# Patient Record
Sex: Female | Born: 1935 | Race: Black or African American | Hispanic: No | Smoking: Never smoker
Health system: Southern US, Community
[De-identification: ages and names within clinical notes are randomized; demographics above are authoritative.]

## PROBLEM LIST (undated history)

## (undated) DIAGNOSIS — I428 Other cardiomyopathies: Secondary | ICD-10-CM

## (undated) DIAGNOSIS — I10 Essential (primary) hypertension: Secondary | ICD-10-CM

## (undated) DIAGNOSIS — E039 Hypothyroidism, unspecified: Secondary | ICD-10-CM

## (undated) DIAGNOSIS — I739 Peripheral vascular disease, unspecified: Secondary | ICD-10-CM

## (undated) DIAGNOSIS — I214 Non-ST elevation (NSTEMI) myocardial infarction: Secondary | ICD-10-CM

## (undated) DIAGNOSIS — C73 Malignant neoplasm of thyroid gland: Secondary | ICD-10-CM

## (undated) DIAGNOSIS — E669 Obesity, unspecified: Secondary | ICD-10-CM

## (undated) DIAGNOSIS — I2510 Atherosclerotic heart disease of native coronary artery without angina pectoris: Secondary | ICD-10-CM

## (undated) DIAGNOSIS — N183 Chronic kidney disease, stage 3 unspecified: Secondary | ICD-10-CM

## (undated) DIAGNOSIS — C8190 Hodgkin lymphoma, unspecified, unspecified site: Secondary | ICD-10-CM

## (undated) DIAGNOSIS — C50911 Malignant neoplasm of unspecified site of right female breast: Secondary | ICD-10-CM

## (undated) DIAGNOSIS — Z853 Personal history of malignant neoplasm of breast: Secondary | ICD-10-CM

## (undated) DIAGNOSIS — Z85850 Personal history of malignant neoplasm of thyroid: Secondary | ICD-10-CM

## (undated) DIAGNOSIS — I82409 Acute embolism and thrombosis of unspecified deep veins of unspecified lower extremity: Secondary | ICD-10-CM

## (undated) DIAGNOSIS — J45909 Unspecified asthma, uncomplicated: Secondary | ICD-10-CM

## (undated) HISTORY — PX: BUNIONECTOMY: SHX129

## (undated) HISTORY — DX: Hodgkin lymphoma, unspecified, unspecified site: C81.90

## (undated) HISTORY — PX: ABDOMINAL HYSTERECTOMY: SHX81

## (undated) HISTORY — PX: APPENDECTOMY: SHX54

## (undated) HISTORY — DX: Peripheral vascular disease, unspecified: I73.9

## (undated) HISTORY — DX: Hypothyroidism, unspecified: E03.9

## (undated) HISTORY — DX: Malignant neoplasm of thyroid gland: C73

## (undated) HISTORY — DX: Other cardiomyopathies: I42.8

## (undated) HISTORY — DX: Atherosclerotic heart disease of native coronary artery without angina pectoris: I25.10

## (undated) HISTORY — PX: COLONOSCOPY: SHX174

## (undated) HISTORY — DX: Personal history of malignant neoplasm of breast: Z85.3

## (undated) HISTORY — DX: Unspecified asthma, uncomplicated: J45.909

## (undated) HISTORY — PX: TONSILLECTOMY: SUR1361

## (undated) HISTORY — DX: Personal history of malignant neoplasm of thyroid: Z85.850

## (undated) HISTORY — DX: Essential (primary) hypertension: I10

## (undated) HISTORY — PX: THYROIDECTOMY: SHX17

## (undated) HISTORY — DX: Acute embolism and thrombosis of unspecified deep veins of unspecified lower extremity: I82.409

## (undated) HISTORY — DX: Obesity, unspecified: E66.9

---

## 1985-09-25 DIAGNOSIS — Z853 Personal history of malignant neoplasm of breast: Secondary | ICD-10-CM

## 1985-09-25 HISTORY — DX: Personal history of malignant neoplasm of breast: Z85.3

## 1985-09-25 HISTORY — PX: MASTECTOMY PARTIAL / LUMPECTOMY W/ AXILLARY LYMPHADENECTOMY: SUR852

## 1998-10-29 ENCOUNTER — Encounter: Admission: RE | Admit: 1998-10-29 | Discharge: 1999-01-27 | Payer: Self-pay | Admitting: Radiation Oncology

## 1999-05-02 ENCOUNTER — Encounter (HOSPITAL_COMMUNITY): Payer: Self-pay | Admitting: Oncology

## 1999-05-02 ENCOUNTER — Ambulatory Visit (HOSPITAL_COMMUNITY): Admission: RE | Admit: 1999-05-02 | Discharge: 1999-05-02 | Payer: Self-pay | Admitting: Oncology

## 1999-05-02 ENCOUNTER — Encounter (INDEPENDENT_AMBULATORY_CARE_PROVIDER_SITE_OTHER): Payer: Self-pay | Admitting: Specialist

## 2001-02-05 ENCOUNTER — Encounter (HOSPITAL_COMMUNITY): Admission: RE | Admit: 2001-02-05 | Discharge: 2001-03-07 | Payer: Self-pay | Admitting: Oncology

## 2001-02-05 ENCOUNTER — Encounter: Admission: RE | Admit: 2001-02-05 | Discharge: 2001-02-05 | Payer: Self-pay | Admitting: Oncology

## 2001-03-07 ENCOUNTER — Encounter (HOSPITAL_COMMUNITY): Payer: Self-pay | Admitting: Oncology

## 2001-03-18 ENCOUNTER — Encounter (HOSPITAL_COMMUNITY): Payer: Self-pay | Admitting: Oncology

## 2001-03-18 ENCOUNTER — Ambulatory Visit (HOSPITAL_COMMUNITY): Admission: RE | Admit: 2001-03-18 | Discharge: 2001-03-18 | Payer: Self-pay | Admitting: Oncology

## 2001-07-30 ENCOUNTER — Encounter (HOSPITAL_COMMUNITY): Admission: RE | Admit: 2001-07-30 | Discharge: 2001-08-29 | Payer: Self-pay | Admitting: Oncology

## 2001-07-30 ENCOUNTER — Encounter: Admission: RE | Admit: 2001-07-30 | Discharge: 2001-07-30 | Payer: Self-pay | Admitting: Oncology

## 2001-09-12 ENCOUNTER — Encounter (HOSPITAL_COMMUNITY): Admission: RE | Admit: 2001-09-12 | Discharge: 2001-10-12 | Payer: Self-pay | Admitting: Oncology

## 2001-09-12 ENCOUNTER — Encounter (HOSPITAL_COMMUNITY): Payer: Self-pay | Admitting: Oncology

## 2001-09-12 ENCOUNTER — Encounter: Admission: RE | Admit: 2001-09-12 | Discharge: 2001-09-12 | Payer: Self-pay | Admitting: Oncology

## 2001-11-08 ENCOUNTER — Encounter: Payer: Self-pay | Admitting: Family Medicine

## 2001-11-08 ENCOUNTER — Ambulatory Visit (HOSPITAL_COMMUNITY): Admission: RE | Admit: 2001-11-08 | Discharge: 2001-11-08 | Payer: Self-pay | Admitting: Family Medicine

## 2001-11-25 ENCOUNTER — Encounter: Payer: Self-pay | Admitting: Family Medicine

## 2001-11-25 ENCOUNTER — Ambulatory Visit (HOSPITAL_COMMUNITY): Admission: RE | Admit: 2001-11-25 | Discharge: 2001-11-25 | Payer: Self-pay | Admitting: Family Medicine

## 2001-12-13 ENCOUNTER — Encounter (HOSPITAL_COMMUNITY): Admission: RE | Admit: 2001-12-13 | Discharge: 2002-01-12 | Payer: Self-pay | Admitting: Oncology

## 2001-12-13 ENCOUNTER — Encounter: Admission: RE | Admit: 2001-12-13 | Discharge: 2001-12-13 | Payer: Self-pay | Admitting: Oncology

## 2002-01-28 ENCOUNTER — Encounter: Admission: RE | Admit: 2002-01-28 | Discharge: 2002-01-28 | Payer: Self-pay | Admitting: Oncology

## 2002-01-28 ENCOUNTER — Encounter (HOSPITAL_COMMUNITY): Admission: RE | Admit: 2002-01-28 | Discharge: 2002-02-27 | Payer: Self-pay | Admitting: Oncology

## 2002-05-12 ENCOUNTER — Encounter (HOSPITAL_COMMUNITY): Admission: RE | Admit: 2002-05-12 | Discharge: 2002-06-11 | Payer: Self-pay | Admitting: Oncology

## 2002-05-12 ENCOUNTER — Encounter: Admission: RE | Admit: 2002-05-12 | Discharge: 2002-05-12 | Payer: Self-pay | Admitting: Oncology

## 2002-05-13 ENCOUNTER — Encounter (HOSPITAL_COMMUNITY): Payer: Self-pay | Admitting: Oncology

## 2002-05-16 ENCOUNTER — Encounter (HOSPITAL_COMMUNITY): Payer: Self-pay | Admitting: Oncology

## 2002-07-31 ENCOUNTER — Encounter (HOSPITAL_COMMUNITY): Admission: RE | Admit: 2002-07-31 | Discharge: 2002-08-30 | Payer: Self-pay | Admitting: Oncology

## 2002-07-31 ENCOUNTER — Encounter: Admission: RE | Admit: 2002-07-31 | Discharge: 2002-07-31 | Payer: Self-pay | Admitting: Oncology

## 2002-09-19 ENCOUNTER — Encounter (HOSPITAL_COMMUNITY): Admission: RE | Admit: 2002-09-19 | Discharge: 2002-10-19 | Payer: Self-pay | Admitting: Oncology

## 2002-09-19 ENCOUNTER — Encounter: Admission: RE | Admit: 2002-09-19 | Discharge: 2002-09-19 | Payer: Self-pay | Admitting: Oncology

## 2002-09-19 ENCOUNTER — Encounter (HOSPITAL_COMMUNITY): Payer: Self-pay | Admitting: Oncology

## 2002-11-28 ENCOUNTER — Encounter (HOSPITAL_COMMUNITY): Admission: RE | Admit: 2002-11-28 | Discharge: 2002-12-28 | Payer: Self-pay | Admitting: Oncology

## 2002-11-28 ENCOUNTER — Encounter: Admission: RE | Admit: 2002-11-28 | Discharge: 2002-11-28 | Payer: Self-pay | Admitting: Oncology

## 2003-04-21 ENCOUNTER — Encounter: Payer: Self-pay | Admitting: Family Medicine

## 2003-04-21 ENCOUNTER — Ambulatory Visit (HOSPITAL_COMMUNITY): Admission: RE | Admit: 2003-04-21 | Discharge: 2003-04-21 | Payer: Self-pay | Admitting: Family Medicine

## 2003-06-03 ENCOUNTER — Encounter (HOSPITAL_COMMUNITY): Admission: RE | Admit: 2003-06-03 | Discharge: 2003-06-25 | Payer: Self-pay | Admitting: Oncology

## 2003-06-03 ENCOUNTER — Encounter: Admission: RE | Admit: 2003-06-03 | Discharge: 2003-06-03 | Payer: Self-pay | Admitting: Oncology

## 2003-07-27 ENCOUNTER — Ambulatory Visit (HOSPITAL_COMMUNITY): Admission: RE | Admit: 2003-07-27 | Discharge: 2003-07-27 | Payer: Self-pay | Admitting: *Deleted

## 2003-08-07 ENCOUNTER — Ambulatory Visit (HOSPITAL_COMMUNITY): Admission: RE | Admit: 2003-08-07 | Discharge: 2003-08-07 | Payer: Self-pay | Admitting: Internal Medicine

## 2003-09-30 ENCOUNTER — Ambulatory Visit (HOSPITAL_COMMUNITY): Admission: RE | Admit: 2003-09-30 | Discharge: 2003-09-30 | Payer: Self-pay | Admitting: Family Medicine

## 2004-01-18 ENCOUNTER — Encounter (HOSPITAL_COMMUNITY): Admission: RE | Admit: 2004-01-18 | Discharge: 2004-02-17 | Payer: Self-pay | Admitting: Oncology

## 2004-01-18 ENCOUNTER — Encounter: Admission: RE | Admit: 2004-01-18 | Discharge: 2004-01-18 | Payer: Self-pay | Admitting: Oncology

## 2004-07-18 ENCOUNTER — Encounter (HOSPITAL_COMMUNITY): Admission: RE | Admit: 2004-07-18 | Discharge: 2004-08-17 | Payer: Self-pay | Admitting: Oncology

## 2004-07-18 ENCOUNTER — Encounter: Admission: RE | Admit: 2004-07-18 | Discharge: 2004-07-18 | Payer: Self-pay | Admitting: Oncology

## 2004-09-30 ENCOUNTER — Ambulatory Visit (HOSPITAL_COMMUNITY): Admission: RE | Admit: 2004-09-30 | Discharge: 2004-09-30 | Payer: Self-pay | Admitting: Family Medicine

## 2005-07-12 ENCOUNTER — Encounter: Payer: Self-pay | Admitting: Cardiovascular Disease

## 2005-07-13 ENCOUNTER — Inpatient Hospital Stay (HOSPITAL_COMMUNITY): Admission: RE | Admit: 2005-07-13 | Discharge: 2005-07-14 | Payer: Self-pay | Admitting: Cardiovascular Disease

## 2005-07-13 HISTORY — PX: ANGIOPLASTY / STENTING FEMORAL: SUR30

## 2005-07-19 ENCOUNTER — Encounter: Admission: RE | Admit: 2005-07-19 | Discharge: 2005-07-19 | Payer: Self-pay | Admitting: Oncology

## 2005-07-19 ENCOUNTER — Ambulatory Visit (HOSPITAL_COMMUNITY): Payer: Self-pay | Admitting: Oncology

## 2005-10-13 ENCOUNTER — Ambulatory Visit (HOSPITAL_COMMUNITY): Payer: Self-pay | Admitting: Oncology

## 2005-10-13 ENCOUNTER — Encounter (HOSPITAL_COMMUNITY): Admission: RE | Admit: 2005-10-13 | Discharge: 2005-11-12 | Payer: Self-pay | Admitting: Oncology

## 2005-10-13 ENCOUNTER — Encounter: Admission: RE | Admit: 2005-10-13 | Discharge: 2005-10-13 | Payer: Self-pay | Admitting: Oncology

## 2006-07-25 ENCOUNTER — Ambulatory Visit (HOSPITAL_COMMUNITY): Payer: Self-pay | Admitting: Oncology

## 2006-07-25 ENCOUNTER — Encounter (HOSPITAL_COMMUNITY): Admission: RE | Admit: 2006-07-25 | Discharge: 2006-08-24 | Payer: Self-pay | Admitting: Oncology

## 2006-07-25 ENCOUNTER — Encounter: Admission: RE | Admit: 2006-07-25 | Discharge: 2006-07-25 | Payer: Self-pay | Admitting: Oncology

## 2006-07-27 ENCOUNTER — Other Ambulatory Visit: Admission: RE | Admit: 2006-07-27 | Discharge: 2006-07-27 | Payer: Self-pay | Admitting: Family Medicine

## 2006-07-27 ENCOUNTER — Encounter (INDEPENDENT_AMBULATORY_CARE_PROVIDER_SITE_OTHER): Payer: Self-pay | Admitting: Specialist

## 2006-10-15 ENCOUNTER — Encounter (HOSPITAL_COMMUNITY): Admission: RE | Admit: 2006-10-15 | Discharge: 2006-11-14 | Payer: Self-pay | Admitting: Oncology

## 2006-11-26 ENCOUNTER — Ambulatory Visit (HOSPITAL_COMMUNITY): Admission: RE | Admit: 2006-11-26 | Discharge: 2006-11-26 | Payer: Self-pay | Admitting: Family Medicine

## 2006-11-28 ENCOUNTER — Ambulatory Visit (HOSPITAL_COMMUNITY): Admission: RE | Admit: 2006-11-28 | Discharge: 2006-11-28 | Payer: Self-pay | Admitting: Family Medicine

## 2007-07-24 ENCOUNTER — Ambulatory Visit (HOSPITAL_COMMUNITY): Payer: Self-pay | Admitting: Oncology

## 2007-10-17 ENCOUNTER — Encounter (HOSPITAL_COMMUNITY): Admission: RE | Admit: 2007-10-17 | Discharge: 2007-11-16 | Payer: Self-pay | Admitting: Oncology

## 2007-10-28 ENCOUNTER — Encounter: Admission: RE | Admit: 2007-10-28 | Discharge: 2007-10-28 | Payer: Self-pay | Admitting: Oncology

## 2008-04-24 ENCOUNTER — Ambulatory Visit (HOSPITAL_COMMUNITY): Admission: RE | Admit: 2008-04-24 | Discharge: 2008-04-24 | Payer: Self-pay | Admitting: Family Medicine

## 2008-05-11 ENCOUNTER — Ambulatory Visit (HOSPITAL_COMMUNITY): Admission: RE | Admit: 2008-05-11 | Discharge: 2008-05-11 | Payer: Self-pay | Admitting: Family Medicine

## 2008-07-22 ENCOUNTER — Ambulatory Visit (HOSPITAL_COMMUNITY): Payer: Self-pay | Admitting: Oncology

## 2008-08-17 ENCOUNTER — Ambulatory Visit: Payer: Self-pay | Admitting: Internal Medicine

## 2008-08-26 ENCOUNTER — Ambulatory Visit: Payer: Self-pay | Admitting: Internal Medicine

## 2008-08-26 ENCOUNTER — Ambulatory Visit (HOSPITAL_COMMUNITY): Admission: RE | Admit: 2008-08-26 | Discharge: 2008-08-26 | Payer: Self-pay | Admitting: Internal Medicine

## 2008-08-26 ENCOUNTER — Encounter: Payer: Self-pay | Admitting: Internal Medicine

## 2008-10-23 ENCOUNTER — Encounter (HOSPITAL_COMMUNITY): Admission: RE | Admit: 2008-10-23 | Discharge: 2008-11-22 | Payer: Self-pay | Admitting: Oncology

## 2009-07-21 ENCOUNTER — Encounter (HOSPITAL_COMMUNITY): Admission: RE | Admit: 2009-07-21 | Discharge: 2009-08-20 | Payer: Self-pay | Admitting: Oncology

## 2009-07-21 ENCOUNTER — Ambulatory Visit (HOSPITAL_COMMUNITY): Payer: Self-pay | Admitting: Oncology

## 2009-10-25 ENCOUNTER — Ambulatory Visit (HOSPITAL_COMMUNITY): Admission: RE | Admit: 2009-10-25 | Discharge: 2009-10-25 | Payer: Self-pay | Admitting: Family Medicine

## 2010-07-20 ENCOUNTER — Encounter (HOSPITAL_COMMUNITY)
Admission: RE | Admit: 2010-07-20 | Discharge: 2010-08-19 | Payer: Self-pay | Source: Home / Self Care | Admitting: Oncology

## 2010-07-20 ENCOUNTER — Ambulatory Visit (HOSPITAL_COMMUNITY): Payer: Self-pay | Admitting: Oncology

## 2010-08-01 ENCOUNTER — Emergency Department (HOSPITAL_COMMUNITY): Admission: EM | Admit: 2010-08-01 | Discharge: 2010-08-01 | Payer: Self-pay | Admitting: Emergency Medicine

## 2010-09-14 ENCOUNTER — Encounter (HOSPITAL_COMMUNITY)
Admission: RE | Admit: 2010-09-14 | Discharge: 2010-10-14 | Payer: Self-pay | Source: Home / Self Care | Attending: Oncology | Admitting: Oncology

## 2010-09-14 ENCOUNTER — Ambulatory Visit (HOSPITAL_COMMUNITY): Payer: Self-pay | Admitting: Oncology

## 2010-10-13 ENCOUNTER — Other Ambulatory Visit (HOSPITAL_COMMUNITY): Payer: Self-pay | Admitting: Oncology

## 2010-10-13 DIAGNOSIS — Z139 Encounter for screening, unspecified: Secondary | ICD-10-CM

## 2010-10-16 ENCOUNTER — Encounter (HOSPITAL_COMMUNITY): Payer: Self-pay | Admitting: Oncology

## 2010-10-27 ENCOUNTER — Encounter (HOSPITAL_COMMUNITY): Payer: Self-pay

## 2010-10-27 ENCOUNTER — Ambulatory Visit (HOSPITAL_COMMUNITY)
Admission: RE | Admit: 2010-10-27 | Discharge: 2010-10-27 | Disposition: A | Discharge status: Home / Self Care | Payer: Medicare Other | Source: Ambulatory Visit | Attending: Oncology | Admitting: Oncology

## 2010-10-27 ENCOUNTER — Ambulatory Visit (HOSPITAL_COMMUNITY): Admission: RE | Admit: 2010-10-27 | Payer: Self-pay | Source: Home / Self Care | Admitting: Oncology

## 2010-10-27 DIAGNOSIS — Z139 Encounter for screening, unspecified: Secondary | ICD-10-CM

## 2010-10-27 DIAGNOSIS — Z1231 Encounter for screening mammogram for malignant neoplasm of breast: Secondary | ICD-10-CM | POA: Insufficient documentation

## 2010-12-05 LAB — COMPREHENSIVE METABOLIC PANEL
Albumin: 3.7 g/dL (ref 3.5–5.2)
BUN: 18 mg/dL (ref 6–23)
Calcium: 8.1 mg/dL — ABNORMAL LOW (ref 8.4–10.5)
Creatinine, Ser: 1.09 mg/dL (ref 0.4–1.2)
GFR calc Af Amer: 59 mL/min — ABNORMAL LOW (ref >60)
Glucose, Bld: 90 mg/dL (ref 70–99)
Potassium: 4.6 mEq/L (ref 3.5–5.1)
Total Protein: 7 g/dL (ref 6.0–8.3)

## 2010-12-07 LAB — CBC
HCT: 36.8 % (ref 36.0–46.0)
Hemoglobin: 11.8 g/dL — ABNORMAL LOW (ref 12.0–15.0)
MCH: 28.6 pg (ref 26.0–34.0)
MCV: 89.2 fL (ref 78.0–100.0)
RBC: 4.12 MIL/uL (ref 3.87–5.11)

## 2010-12-07 LAB — SEDIMENTATION RATE: Sed Rate: 15 mm/hr (ref 0–22)

## 2010-12-07 LAB — DIFFERENTIAL
Eosinophils Absolute: 0.1 10*3/uL (ref 0.0–0.7)
Lymphs Abs: 1.6 10*3/uL (ref 0.7–4.0)
Neutrophils Relative %: 68 % (ref 43–77)

## 2010-12-07 LAB — COMPREHENSIVE METABOLIC PANEL
ALT: 20 U/L (ref 0–35)
Alkaline Phosphatase: 49 U/L (ref 39–117)
BUN: 19 mg/dL (ref 6–23)
CO2: 31 mEq/L (ref 19–32)
Glucose, Bld: 115 mg/dL — ABNORMAL HIGH (ref 70–99)
Potassium: 4.1 mEq/L (ref 3.5–5.1)
Total Bilirubin: 0.8 mg/dL (ref 0.3–1.2)
Total Protein: 6.8 g/dL (ref 6.0–8.3)

## 2010-12-07 LAB — LACTATE DEHYDROGENASE: LDH: 256 U/L — ABNORMAL HIGH (ref 94–250)

## 2010-12-29 LAB — DIFFERENTIAL
Basophils Absolute: 0.1 10*3/uL (ref 0.0–0.1)
Eosinophils Relative: 1 % (ref 0–5)
Lymphocytes Relative: 33 % (ref 12–46)
Neutro Abs: 3.2 10*3/uL (ref 1.7–7.7)
Neutrophils Relative %: 55 % (ref 43–77)

## 2010-12-29 LAB — LACTATE DEHYDROGENASE: LDH: 230 U/L (ref 94–250)

## 2010-12-29 LAB — COMPREHENSIVE METABOLIC PANEL
ALT: 22 U/L (ref 0–35)
AST: 23 U/L (ref 0–37)
Alkaline Phosphatase: 57 U/L (ref 39–117)
CO2: 30 mEq/L (ref 19–32)
GFR calc Af Amer: >60 mL/min (ref >60)
GFR calc non Af Amer: 59 mL/min — ABNORMAL LOW (ref >60)
Glucose, Bld: 83 mg/dL (ref 70–99)
Potassium: 3.6 mEq/L (ref 3.5–5.1)
Sodium: 141 mEq/L (ref 135–145)

## 2010-12-29 LAB — TSH: TSH: 3.807 u[IU]/mL (ref 0.350–4.500)

## 2010-12-29 LAB — CBC
MCV: 88.4 fL (ref 78.0–100.0)
RBC: 4.47 MIL/uL (ref 3.87–5.11)
WBC: 5.9 10*3/uL (ref 4.0–10.5)

## 2011-02-07 NOTE — Op Note (Signed)
[name], [name]            ACCOUNT NO.:  [id number]   MEDICAL RECORD NO.:  [id number]          PATIENT TYPE:  AMB   LOCATION:  DAY                           FACILITY:  [facility]   PHYSICIAN:  R. Garfield Cornea, M.D. DATE OF BIRTH:  1930/07/26   DATE OF PROCEDURE:  08/26/2008  DATE OF DISCHARGE:                               OPERATIVE REPORT   INDICATIONS FOR PROCEDURE:  The patient is a 76 year old African  American lady with history of multiple non-GI cancers including breasts.  She is here for high-risk screening.  Risks, benefits, alternatives, and  limitations have been reviewed, questions answered.  Please see the  documentation in the medical record.   PROCEDURE NOTE:  O2 saturation, blood pressure, pulse, and respirations  were monitored throughout the entire procedure.   CONSCIOUS SEDATION:  Versed 3 mg IV, Demerol 75 mg IV in divided doses.   INSTRUMENT:  Pentax video chip system.   FINDINGS:  Digital rectal exam revealed no abnormalities.  Endoscopic  Findings:  Prep was good.  Colon:  Colonic mucosa was surveyed from the  rectosigmoid junction through the left transverse, right colon to the  appendiceal orifice, ileocecal valve, and cecum.  These structures were  well seen and photographed for the record.  From this level, scope was  slowly withdrawn.  All previously mentioned mucosal surfaces were again  seen.  The patient was noted to have pancolonic diverticula and focal 2-  3 mm area of adenomatous appearing mucosa and hepatic flexure which was  essentially totally removed, denuded from surrounding mucosa with one  pass of cold biopsy forceps.  Remainder of the colonic mucosa appeared  normal.  Scope was pulled down the rectum where a thorough examination  of the rectal mucosa including retroflexed view of the anal verge  demonstrated only some internal hemorrhoids.  The patient tolerated the  procedure well and was reacted in endoscopy.   IMPRESSION:  1.  Internal hemorrhoids, otherwise normal rectum.  2. Pancolonic diverticula.  Tiny area of focally abnormal colonic      mucosa of uncertain clinical significance.  Hepatic flexure removed      with cold biopsy forceps as described above.  Remainder of the      colonic mucosa appeared normal.   RECOMMENDATIONS:  1. Diverticulosis literature provided to [name].  2. Follow up on path.  3. Further recommendations to follow.      Bridgette Habermann, M.D.  Electronically Signed     RMR/MEDQ  D:  08/26/2008  T:  08/27/2008  Job:  [id number]   cc:   Estill Bamberg. Karie Kirks, M.D.  Fax: [phone]

## 2011-02-07 NOTE — H&P (Signed)
Christine, Padilla            ACCOUNT NO.:  [id number]   MEDICAL RECORD NO.:  [id number]          PATIENT TYPE:  AMB   LOCATION:  DAY                           FACILITY:  [facility]   PHYSICIAN:  R. Garfield Cornea, M.D. DATE OF BIRTH:  [birth year]   DATE OF ADMISSION:  DATE OF DISCHARGE:  LH                              HISTORY & PHYSICAL   PRIMARY CARE PHYSICIAN:  Estill Bamberg. Karie Kirks, M.D.   CHIEF COMPLAINT:  Due for high-risk screening colonoscopy.   HISTORY OF PRESENT ILLNESS:  [name] is a 77 year old African  female.  She has a personal history of thyroid carcinoma, breast  carcinoma and Hodgkin's disease.  She has not had any GI complaints at  this time.  She denies any rectal bleeding, melena, abdominal pain,  constipation, diarrhea.  Her weight has remained stable.  Her appetite  is good.  It has been recommended by Dr. Gala Romney that she has high-risk  screening every 5 years.  She is in agreement with this plan.   PAST MEDICAL/SURGICAL HISTORY:  1. Breast carcinoma.  2. Pancolonic diverticulosis seen on last colonoscopy August 07, 2003 by Dr. Gala Romney.  3. Asthma.  4. Hypothyroidism.  5. Thyroid carcinoma, status post partial thyroidectomy.  6. Hodgkin's disease.  7. Hysterectomy.  8. Appendectomy.  9. Tonsillectomy.  10.Bilateral oophorectomy post-hysterectomy.  11.Osteoporosis.  12.Obesity.  13.Insomnia.   CURRENT MEDICATIONS:  The patient did not bring with her.  We have  contacted her pharmacy, [facility] and are awaiting arrival of her records.  From what it looks like, she is on:  1. Temazepam 30 mg q.h.s.  2. ECASA 180 mg daily.  3. Vitamin D 400 international units daily.  4. Multivitamin daily.  5. Levothyroxine 150 mcg daily.  6. Atenolol 25 mg daily.  7. Calcium 500 mg and vitamin D daily.  8. Simvastatin 20 mg daily.  9. Lisinopril 20 mg q.h.s.  10.Bee pollen 1 gm daily.  11.Nexium 40 mg daily.   ALLERGIES:  NO KNOWN DRUG ALLERGIES.   FAMILY  HISTORY:  Mother deceased secondary to lung cancer at age 51.  Father deceased at age 76 secondary to MI.  She has 1 healthy sister  with diabetes mellitus.   SOCIAL HISTORY:  [name] has been married for 77 years.  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 She denies any tobacco, alcohol or  drug use.   REVIEW OF SYSTEMS:  See HPI, otherwise negative.   PHYSICAL EXAMINATION:  VITAL SIGNS:  Weight 213 pounds, height 69  inches, temperature 98.2, blood pressure 142/80, pulse 64.  GENERAL:  She is a well-developed, well-nourished, obese, African  American female who is alert, oriented, pleasant and cooperative in no  acute distress.  HEENT:  Sclerae are clear and nonicteric.  Conjunctivae are pink.  Oropharynx pink and moist without any lesions.  CHEST:  Heart regular rate and rhythm.  Normal S1-S2 with no murmurs,  clicks, rubs or gallops.  LUNGS:  Clear to auscultation bilaterally.  ABDOMEN:  Positive bowel sounds x4.  Abdomen is soft, nontender,  nondistended without palpable mass or hepatosplenomegaly  There are no  rashes or guarding.  EXTREMITIES:  Without clubbing or edema   IMPRESSION:  [name] is a 77 year old female with history of  multiple cancers including thyroid, breasts, and Hodgkin's.  She is due  for high-risk screening colonoscopy.  She denies any GI complaints at  this time.   PLAN:  High-risk screening colonoscopy with Dr. Gala Romney in the near  future.  I have discussed the procedure, risks and benefits, but are not  limited to bleeding, infection, perforation, drug reaction.  She agrees  to plan and consent will be obtained.      Vickey Huger, N.P.      Bridgette Habermann, M.D.  Electronically Signed    KJ/MEDQ  D:  08/18/2008  T:  08/18/2008  Job:  [id number]   cc:   Estill Bamberg. Karie Kirks, M.D.  Fax: [phone]

## 2011-02-10 NOTE — Op Note (Signed)
NAMEJAYLANNI, GRAHOVAC            ACCOUNT NO.:  [id number]   MEDICAL RECORD NO.:  [id number]          PATIENT TYPE:  OIB   LOCATION:  [id number]                         FACILITY:  [facility]   PHYSICIAN:  Richard A. Rollene Fare, M.D.DATE OF BIRTH:  [birth year]   DATE OF PROCEDURE:  07/13/2005  DATE OF DISCHARGE:                                 OPERATIVE REPORT   PROCEDURE:  Retrograde abdominal aortic catheterization, abdominal aortic  angiogram, midstream posteroanterior projection, bilateral iliac  angiography, posteroanterior and oblique projections, by hand with selective  catheterization, bilateral iliac, percutaneous transluminal angioplasty,  high-grade eccentric, calcific obstructive left common iliac stenosis and  subsequent nitinol self-expanding large Smart stent and post-deployment  dilatation with large balloon left common iliac artery via ipsilateral  approach, bilateral common femoral artery Star closure device(nitinol clip),  successful.   BRIEF HISTORY:  Mrs. Legg is an extremely pleasant 77 year old [place] married mother of 2 with 3 grandchildren, retired from [employer] in  [place].  She has a history of noncritical coronary disease with prior  catheterization by an IVUS interrogation by Drs. Gwenlyn Found and subsequently  [place], showing 50% to 60% mid-distal LAD disease, treated medically long-  term without recurrent angina.  Hypertension, hyperlipidemia and past  history of 3 prior malignancies.  She has had CA of the breast treated with  simple mastectomy and radiation in 1987 and subsequent tamoxifen therapy  chronically.   She has had thyroid CA, in 1991, treated with thyroidectomy and on  supplemental replacement without recurrence.   She is had non-Hodgkin's lymphoma, stage IA, treated with chemotherapy, May  2000.  She had a DVT of the axillary vein from Port-A-Cath, but this was on  Coumadin for a short period of time.  Port-A-Cath was removed and there  has  been no sequelae, no history of lower extremity DVT.   She has had left lower extremity thigh, hip and buttock discomfort and  weakness in the left lower extremity.  There is an exertional component, but  the history was somewhat atypical for claudication.  Duplex evaluation,  however, demonstrated increased velocities in the left common iliac artery  of over 320 cm/sec, compatible with an greater than 80% stenosis.  The  celiac, renals and SMA were intact and she appeared to three-vessel runoff  on duplex Doppler interrogation bilaterally.  In view of this, she was  scheduled for lower extremity angiography and possible PPI (percutaneous  peripheral intervention).   Informed consent was obtained to proceed.  She was brought to the sixth  floor PV lab in a postabsorptive state.  Both groins were  prepped, draped  in the usual manner; 1% Xylocaine was used for local anesthesia.  The  patient is on chronic aspirin at home and she was given 300 milligrams of  Plavix in the lab.  The CRFA was entered with single anterior puncture using  an 18 thin-walled needle and a 5-French short Daig sidearm sheath was  inserted.  Unfortunately, there was equipment malfunction and the sixth  floor PV lab that we could not get corrected.  For this reason, we moved her  down  to the cath lab.  She was brought to the second floor CP lab.  Both  groins were prepped, draped in the usual manner.  The right femoral sheath  was exchanged for a 5-French new sheath sterilely and the patient was given  1 gram of Ancef IV antibiotic prophylaxis.  Using guidewire exchange, a  pigtail catheter was used for abdominal angiogram in the midstream PA  projection at 25 mL, 20 mL per second.  Arterial pressures were monitored  throughout the procedure and showed arterial pressures ranging from 160-185  mmHg.  At the end of the procedure, she was given 10 mg of labetalol IV and  blood pressure came down to the 140-150  range.  She maintained sinus rhythm.  During the diagnostic and interventional procedure, she was given total of 2  mg of Versed IV and 4 mg of Nubain IV in addition to 2 mg of Versed for  sedation.   A second injection was done above the iliac bifurcation at 20 mL, 20 mL per  second.  An IMA catheter was then used to access the left common iliac, and  left common iliac and left iliac angiography was done through a 5-French IMA  catheter by hand injection in the oblique projections.  Also, a trans-  stenotic gradient across the left common iliac showed a gradient of 70-80  mmHg on pullback.  Catheter was removed and right iliac angiography was  performed in the oblique projections.  Bilateral profunda/SFA angiography  was performed in the PA projection with runoff to the midthigh level  bilaterally.  The patient tolerated the diagnostic procedure well.   Abdominal aortic angiogram in the midstream PA projection showed widely  patent SMA and celiac axis.  The left renal artery was single and normal.  The right renal artery arose from the anterior aorta, but appeared widely  patent with good flow to the right kidney and there appeared to be a second  accessory lower pole renal artery and actually originated above the upper  pole renal artery and came from the lateral aorta normally.  The infrarenal  abdominal aorta was widely patent.  There was no significant stenosis or  aneurysm formation.   The iliac bifurcation was widely patent.  The R CIA, REAA and hypogastrics  were intact, widely patent and tortuous, no aneurysm formation, but very  large.  The right profunda-SFA junction and right SFA to the mid-thigh was  widely patent.   The left common iliac demonstrated a calcific eccentric high-grade plaque  with approximately 90% eccentric stenosis and gradient as outlined above,  best seen in the oblique projections.  The left hypogastric and external iliacs were intact, widely patent,  and left profunda-SFA was intact, widely  patent and the left SFA was widely patent to the mid left thigh area.   The patient has high-grade L CIA calcific stenosis, as outlined above, and  with her symptoms compatible with claudication and abnormal Dopplers, it was  elected to proceed with intervention.  She was given Plavix 300 mg p.o.,  continued on aspirin and given weight-adjusted heparin, monitoring ACTs at  3500.  The L CFA was then entered with a single anterior puncture using an  18 thin-wall needle and a 7-French long Daig sidearm sheath was inserted.  A  Wholey wire was used to traverse the left common iliac lesion.  The lesion  was identified and crossed with a 6-mm undersized, 4-cm length, Powerflex  Cordis balloon.  Inflation was  done at 6-30, showing despite calcification,  probably soft localize plaque.  The balloon was pulled back and to avoid any  risk of distal embolization, it was felt best to stent this the area at this  time and then postdilate it.  There was a large vessel and a Smart 14-mm x  40- mm stent was positioned across the lesion and deployed under  fluoroscopic control.  The dilatation system was removed, showing good  deployment.  The stent was then postdilated using exchange technique with a  12-mm x 4-cm Cordis Powerflex balloon that was dilated at 7-40 with good  stent expansion.  The balloon was removed and final injection showed still  some eccentric plaque, but widely patent lumen.  There was no gradient with  the side-arm sheath across the lesion and the stent extended just below the  iliac bifurcation, so as not to obstruct access.  It was fully deployed.   The left groin was then closed with a 6-French StarClose device nitinol clip  successfully.  The right groin site was also closed with a 6-French  StarClose nitinol clip successfully.  The patient was given 10 mg of  labetalol for blood pressure reduction before this.  She tolerated the   procedure well and transferred from the holding area for postoperative care  in stable condition.   She has had a successful left CIA PTA and nitinol self-expanding stent with  good post-deployment dilatation and good angiographic result and elimination  of her gradient.  We will do followup Dopplers as an outpatient to assess  this and also assess her clinical response.  We will probably increase her  ACE inhibitor because of systemic hypertension and continue aspirin and  short-term Plavix.   CATHETERIZATION DIAGNOSES:  1.  Arteriosclerotic peripheral vascular disease, left lower extremity      claudication and abnormal Dopplers, suggesting high-grade left common      iliac artery stenosis, June 2006.  2.  Successful left common iliac large vessel percutaneous transluminal      angioplasty, subsequent self-expanding nitinol large-vessel stent and      post-deployment dilatation today, July 13, 2005.  3.  Hyperlipidemia.  4.  Systemic hypertension. 5.  Noncritical coronary disease at last catheterization, 60% mid-vessel      distal left anterior descending stenosis, a recent Cardiolite showing      mild anterolateral ischemia versus breast attenuation, asymptomatic,      followed medically.  6.  Remote carcinoma of the breast, treated with simple mastectomy and      radiation in 1987, no recurrent, on tamoxifen.  7.  History of thyroid carcinoma, 1991, treated with thyroidectomy, on      supplemental thyroid replacement.  8.  History of non-Hodgkin's lymphoma, May 2000, treated with chemotherapy,      no recurrence.  9.  Remote deep venous thrombosis, related to Port-A-Cath, left axillary      vein, no sequelae.  10. Hyperlipidemia.  11. Systemic hypertension, patent renal arteries.      Richard A. Rollene Fare, M.D.  Electronically Signed     RAW/MEDQ  D:  07/13/2005  T:  07/14/2005  Job:  [id number]   cc:   [facility]   Estill Bamberg. Karie Kirks, M.D.  Fax: [phone]    Leslye Peer, MD  Fax: [phone]   Gaston Islam. Tressie Stalker, MD  Fax: [phone]   c/o Terance Ice MD PV Doppler Lab   c/o [facility] Terance Ice MD

## 2011-02-10 NOTE — H&P (Signed)
[name], [name]            ACCOUNT NO.:  [id number]   MEDICAL RECORD NO.:  [id number]          PATIENT TYPE:  AMB   LOCATION:  SDS                          FACILITY:  [facility]   PHYSICIAN:  Richard A. Rollene Fare, M.D.DATE OF BIRTH:  July 17, 1930   DATE OF ADMISSION:  07/13/2005  DATE OF DISCHARGE:                                HISTORY & PHYSICAL   CHIEF COMPLAINT:  No specific complaints except for claudication.   HISTORY OF PRESENT ILLNESS:  A 77 year old African-American female who  worked in [place] with [employer] for many years and was seen by Delfino Lovett A.  Rollene Fare, M.D. secondary to claudication symptoms.   She has cramps in her left leg and occasional pain to her left leg.  It is  difficult to understand if she has any definite exertional abnormalities.  She has iliac disease described as 80%.  Complains of some weakness of her  left lower extremity.  The patient underwent lower extremity Duplex Dopplers  on March 23, 2005, revealing high velocities in the left common iliac with  three vessel runoff and normal ABI's bilaterally.  It was felt that she  probably had a 70% left common iliac variant similar to prior angiography of  August of 2004.  She has not had any rest pain.  Dr. Rollene Fare felt in view  of her lower extremity symptoms and abnormal Dopplers, she needed PV  angiogram and possible left common iliac interventions.  She comes in today  to shortstay with plans for that procedure.   PAST MEDICAL HISTORY:  In addition to peripheral vascular disease, she has  coronary disease, and underwent LAD IVUS interrogation in November of 2004  and was found to have 60% or less stenosis in the midportion beyond the  second diagonal with good residual lumen and evidence of remottling and is  being treated medically.  She has not required any further cardiac  catheterization.   The patient has a history of breast cancer treated with simple mastectomy  and radiation in 1987.   Also a history of thyroid cancer in 1991 treated  with thyroidectomy and is on supplemental thyroid replacement.  She also has  been diagnosed with nonHodgkin's lymphoma in 2000 and chemotherapy was done  at that time and is followed by Gaston Islam. Tressie Stalker, M.D.  She sees him yearly  now.  She finished 5 years of Tamoxifen and has not had any recurrent breast  cancer.  Also a history of remote DVT on the left axillary vein and was on  Coumadin, also a Port-A-Cath removal.   Also some abnormal cholesterol values.   CURRENT MEDICATIONS:  1.  Synthroid 150 mcg one daily.  2.  Temazepam 30 mg at bedtime.  3.  Aspirin 81 mg.  4.  Zocor 10 mg nightly.  5.  Lisinopril 20 mg nightly.  6.  Evista 60 mg every morning.  7.  Atenolol half of a 50 mg tablet.  She had been on 50 mg but due to      bradycardia this was cut back.  8.  She also takes vitamin A and D, Centrum Silver,  and Calcium plus D 500      daily.   ALLERGIES:  No known drug allergies.   SOCIAL HISTORY:  Married.  Retired from [employer].  Has two children and three  grandchildren.  Never used tobacco.   FAMILY HISTORY:  Without change.  Please see Dr. Martie Round note.   REVIEW OF SYSTEMS:  NEUROLOGY:  No lightheadedness, dizziness, or syncope.  GENERAL:  No recent weight gain.  She does have similar mild upper  respiratory infection cold symptoms.  CARDIOVASCULAR:  No chest pain.  LUNGS:  Without shortness of breath.  GASTROINTESTINAL:  No diarrhea,  constipation, or melena.  GENITOURINARY:  No hematuria or dysuria.  HEENT:  Mild cold symptoms, no visual changes.  MUSCULOSKELETAL:  Legs give way  secondary possibly to peripheral vascular disease.   PHYSICAL EXAMINATION:  VITAL SIGNS:  Blood pressure 138/74, pulse 57,  respiratory rate 20, and temperature 97.2.  GENERAL:  Alert and oriented African-American female in no acute distress.  SKIN:  Warm and dry.  Brisk capillary refill.  Sclerae clear.  NECK:  Supple, no JVD, no  bruit, no thyromegaly.  LUNGS:  Clear without rales, rhonchi, or wheezes.  HEART:  S1 and S2, regular rate and rhythm.  I do not hear murmur, gallop,  or rub.  ABDOMEN:  Soft and nontender, positive bowel sounds.  I do not palpate  liver, spleen, or masses.  EXTREMITIES:  Left pedal pulses were severely diminished, almost absent.  Her right pedal pulse is 1+.  No lower extremity edema.  NEUROLOGY:  Alert and oriented.  Moves all extremities.  Positive facial  symmetry.   IMPRESSION:  1.  Claudication.  2.  Abnormal lower extremity Duplex Dopplers.  3.  Coronary artery disease, stable.  4.  History of breast cancer, thyroid cancer, and nonHodgkin's lymphoma,      stable.  5.  Dyslipidemia.  6.  Bradycardia with decrease in her beta blocker.   PLAN:  Continue current medicines.  Dr. Rollene Fare will proceed with PV  angiogram this afternoon or later this morning.      Otilio Carpen. [place], N.P.      Richard A. Rollene Fare, M.D.  Electronically Signed    LRI/MEDQ  D:  07/13/2005  T:  07/13/2005  Job:  [id number]   cc:   Estill Bamberg. Karie Kirks, M.D.  Fax: [phone]   Gaston Islam. Tressie Stalker, MD  Fax: [phone]   Leslye Peer, MD  Fax: [phone]

## 2011-02-10 NOTE — Discharge Summary (Signed)
[name], [name]            ACCOUNT NO.:  [id number]   MEDICAL RECORD NO.:  [id number]          PATIENT TYPE:  INP   LOCATION:  4735                         FACILITY:  [facility]   PHYSICIAN:  [name], M.D.DATE OF BIRTH:  April 05, 1930   DATE OF ADMISSION:  07/13/2005  DATE OF DISCHARGE:  07/14/2005                                 DISCHARGE SUMMARY   ADMISSION DIAGNOSES:  1.  Peripheral vascular occlusive disease.  2.  Coronary artery disease.  3.  History of right mastectomy with radiation therapy.  4.  Status post thyroidectomy.  5.  Non-Hodgkin's lymphoma.  6.  History of deep venous thrombosis.   DISCHARGE DIAGNOSES:  1.  Left upper extremity claudication with abnormal Dopplers.  2.  Successful PTCA with common iliac angioplasty with self-expanding,      Nitrol, large vessel stent and post deployment dilatation July 13, 2005.  3.  Hyperlipidemia.  4.  Hypertension.  5.  Noncritical coronary artery disease, 60% mid LAD.  6.  Remote breast cancer with simple mastectomy and radiation therapy 1987.  7.  History of thyroid cancer treated with a thyroidectomy; and on thyroid      supplement.  8.  Non-Hodgkin's lymphoma, treated with chemotherapy and no reoccurred May      2000.  9.  Remote history of deep venous thrombosis related to a Port-A-Cath, left      axillary vein with no sequela.  10. Hyperlipidemia.  11. Hypertension with patent renal arteries.   BRIEF HISTORY:  The patient is a 77 year old black female, medical patient  of Dr. Leslie Padilla; followed by Dr. Everardo Padilla from Oncology and  Dr. Leslye Padilla from cardiology.  The patient is retired from [employer].  She  has prior history of breast cancer with mastectomy, history of thyroid  cancer treated with thyroidectomy, non-Hodgkin lymphoma in 2000; and a  remote history of DVT.  She has a history of coronary artery disease with  LAD and IVUS interrogation in November 2004; and her last  Cardiolite in  August 2004.   She presents currently with complaint of cramps in her left leg, occasional  pains.  It is difficult to tell if there are any exertional abnormalities.  She has iliac disease described as an 80% left iliac stenosis.  She  complains of some weakness of the left lower extremity. She recently has had  lower extremity Duplexes which showed velocities of the left common iliac  with 3-vessel runoff and normal AVIs bilaterally.  She was ultimately  evaluated by Dr. Rollene Padilla and is admitted at this time for elective  peripheral angiography and treatment as needed.   PAST MEDICAL HISTORY:  As above.   CURRENT MEDICATIONS:  This is taken from her admission list given to the  nurse:  1.  Synthroid 150 mcg daily.  2.  Restoril 30 mg h.s.  3.  Aspirin 81 mg daily.  4.  Zocor 10 mg daily.  5.  Lisinopril 10 mg daily.  6.  Evista 60 mg daily.  7.  Atenolol 50 mg half tablet daily.  8.  Zantac 150 mg t.i.d.   For further history and physical please see the note.   HOSPITAL COURSE:  The patient was admitted, she was taken to the  catheterization lab on the sixth floor, underwent peripheral vascular  arteriogram.  This showed peripheral vascular disease in the left lower  extremity, claudication and abnormal Dopplers suggest of high grade left  iliac stenosis.  Arteriogram revealed a large transluminal area that  required dilatation.  This was done and a large Nitrol stent was placed.  The patient tolerated the procedure well and was transferred back to the  floor.  She had a quiet night, hemoglobin was 11.9, hematocrit 36, white  count was 5.9, platelets were 238 the following morning.  Chem-7 shows  electrolytes to be normal.  BUN is 11, creatinine is 1.0, glucose was 103.  LFTs showed an LDL of 81, total cholesterol 64, HDL of 69, triglycerides of  72.  TSH was 0.952.  At this point she was doing well.  She had some  hypertension early in the a.m.  She was given  some IV fluids and this has  resolved and by the afternoon of July 14, 2005 it was Dr. Lowella Padilla  opinion that the patient was ready for discharge.   DISCHARGE MEDICATIONS:  1.  Synthroid 150 mcg daily.  2.  Restoril 30 mg h.s.  3.  Aspirin 81 mg daily.  4.  Zocor 10 mg being increased to 20 mg daily.  5.  Lisinopril 20 mg at night is being changed to 20 mg in the a.m. and p.m.      b.i.d. and when current supply is completed she will start 40 mg daily.  6.  Evista 60 mg q.a.m.  7.  Atenolol 25 mg daily.  8.  She is being changed from Zantac 150 b.i.d. to Protonix 40 mg daily.  9.  Plavix 75 mg one daily.  10. The patient can continue the Vitamin D, calcium and Centrum as before      admission.   DISCHARGE ACTIVITY:  __________ to monitor, no lifting over 10 pounds, no  driving, no strenuous activity.  Catheter site looks good. She will return  to see Dr. Rollene Padilla in approximately 2 weeks.  Our office will call and  make the appointment.  We will also schedule her for Doppler studies prior  to her return visit.      [name], P.A.      [name], M.D.  Electronically Signed    WDJ/MEDQ  D:  07/14/2005  T:  07/15/2005  Job:  [id number]   cc:   [name], M.D.  Fax: [phone]   Christine Peer, MD  Fax: [phone]   Christine Islam. Tressie Stalker, MD  Fax: [phone]

## 2011-02-10 NOTE — Op Note (Signed)
NAME:  [name], [name]                      ACCOUNT NO.:  [id number]   MEDICAL RECORD NO.:  [id number]                   PATIENT TYPE:  AMB   LOCATION:  DAY                                  FACILITY:  [facility]   PHYSICIAN:  R. Garfield Cornea, M.D.              DATE OF BIRTH:  March 05, 1930   DATE OF PROCEDURE:  08/07/2003  DATE OF DISCHARGE:                                 OPERATIVE REPORT   PROCEDURE:  High-risk screening colonoscopy.   INDICATIONS FOR PROCEDURE:  The patient is a 77 year old lady with a  personal history of thyroid and breast cancer and history of Hodgkin's  disease who is devoid of any lower GI tract symptoms and has no family  history of colorectal neoplasia.  She is referred by the courtesy of Dr.  Everardo All for a screening colonoscopy.  She tells me that she may have  had a colonoscopy many years ago with no significant findings.  Colonoscopy is now being done as a high-risk screening maneuver.  This  approach has been discussed with the patient at length at the bedside.  The  potential risks, benefits, and alternatives have been reviewed and questions  answered.  She is agreeable.  Please see my handwritten H&P for more  information.   PROCEDURE:  O2 saturation, blood pressure, pulses, and respirations were  monitored throughout the entire procedure.  Conscious sedation was with  Versed 3 mg IV, Demerol 75 mg IV in divided doses.  The instrument used was  the Olympus video chip adult colonoscope.   FINDINGS:  Digital rectal examination revealed no abnormalities.   ENDOSCOPIC FINDINGS:  The prep was pristine.   Rectum:  Examination of the rectal mucosa including retroflex view of the  anal verge revealed no abnormalities.   Colon:  The colonic mucosa was surveyed from the rectosigmoid junction  through the left, transverse, right colon to the area of the appendiceal  orifice, ileocecal valve, and cecum.  The procedure was technically easy.  It was a  straight shot to the cecum.  The patient was noted to have a few  scattered pancolonic diverticula.  The remainder of the colonic mucosa  appeared normal.  From the level of the cecum and ileocecal valve, the scope  was slowly withdrawn.  All previously mentioned mucosal surfaces were again  seen, and no other abnormalities were observed.  The patient tolerated the  procedure well and was reactive in endoscopy.   IMPRESSION:  1. Normal rectum.  2. A few scattered pancolonic diverticula.  The remainder of the colonic     mucosa appeared normal.    RECOMMENDATIONS:  1. Diverticulosis literature provided to Ms. Attwood.  2. Repeat colonoscopy in five years.      ___________________________________________  Bridgette Habermann, M.D.   RMR/MEDQ  D:  08/07/2003  T:  08/07/2003  Job:  [id number]   cc:   Gaston Islam. Neijstrom, MD  [address]  [place]  [place] [address]  Fax: [phone]

## 2011-02-10 NOTE — Cardiovascular Report (Signed)
NAME:  [name], [name]                      ACCOUNT NO.:  [id number]   MEDICAL RECORD NO.:  [id number]                   PATIENT TYPE:  OIB   LOCATION:  2857                                 FACILITY:  [facility]   PHYSICIAN:  Octavia Heir, M.D.             DATE OF BIRTH:  1930/03/04   DATE OF PROCEDURE:  07/27/2003  DATE OF DISCHARGE:                              CARDIAC CATHETERIZATION   PROCEDURES PERFORMED:  1. Coronary angiography.  2. Left anterior descending, mid.     a. Intravascular ultrasound imaging.   ATTENDING:  Octavia Heir, M.D.   COMPLICATIONS:  None.   INDICATIONS:  [name] is a 77 year old female patient of Estill Bamberg.  Karie Kirks, M.D. and Leslye Peer, M.D. with a history of recurrent chest pain  status post cardiac catheterization revealing a 60% mid LAD hazy lesion  performed on May 28, 2003.  She has continued to have intermittent  chest pain as well as abnormal Cardiolite scans and echocardiograms and is  now referred for intravascular ultrasound imaging of her LAD.   DESCRIPTION OF OPERATION:  After giving informed written consent, patient  brought to the cardiac catheterization laboratory.  Right and left groin  shaved, prepped, and draped in usual sterile fashion.  ECG monitor  established.  Using a modified Seldinger technique, a number 6-French  arterial sheath inserted in right femoral artery.  Next, a #6-French JL4  guiding catheter was coaxially engaged in the left coronary ostium and  selective angiograms performed.  Following this a 0.014 Forte guidewire was  advanced out of the guiding catheter and positioned in the distal LAD  without difficulty.  Next, a 74 MHz IVUS imaging catheter was then advanced  into the mid LAD and mechanical pullback was then performed.  In the distal  LAD the vessel appeared to be remodeled with a vessel diameter of 3.0 x 3.5  with a luminal diameter of 2.0 x 2.1.  Through the hazy area the vessel  appeared to be approximately 3.0 x 3.0 with a luminal area of 2.5 x 2.5  without any evidence of significant CAD, though there was diffuse  noncritical plaque throughout the mid and proximal LAD.  3000 units of  heparin were given intravenously.   Final orthogonal angiograms revealed 60% stenosis in the mid LAD with no  evidence of dissection or thrombus.  At this point we elected to conclude  the proximal.  All balloons, wires, catheters removed.  Hemostatic sheath  was sewn in place.  The patient was transferred back to the ward in stable  condition.   HEMODYNAMICS:  Systemic arterial pressure 150/68.    CONCLUSION:  Successful intravascular ultrasound imaging of the mid left  anterior descending revealing a 3.0 x 3.0 vessel diameter with a luminal  diameter of 2.5 x 2.5 mm suggesting noncritical coronary artery disease.  Octavia Heir, M.D.    RHM/MEDQ  D:  07/27/2003  T:  07/27/2003  Job:  [id number]   cc:   Estill Bamberg. Karie Kirks, M.D.  [address] [place], [place] [address]  Fax: [phone]   Leslye Peer, MD  [phone], [address] [place]  [place] [address]  Fax: [phone]

## 2011-03-27 ENCOUNTER — Encounter (HOSPITAL_COMMUNITY): Payer: Medicare Other | Attending: Oncology | Admitting: Oncology

## 2011-03-27 ENCOUNTER — Other Ambulatory Visit (HOSPITAL_COMMUNITY): Payer: Self-pay | Admitting: Oncology

## 2011-03-27 DIAGNOSIS — Z853 Personal history of malignant neoplasm of breast: Secondary | ICD-10-CM | POA: Insufficient documentation

## 2011-03-27 DIAGNOSIS — C8190 Hodgkin lymphoma, unspecified, unspecified site: Secondary | ICD-10-CM | POA: Insufficient documentation

## 2011-03-27 DIAGNOSIS — C50919 Malignant neoplasm of unspecified site of unspecified female breast: Secondary | ICD-10-CM

## 2011-03-27 DIAGNOSIS — Z85850 Personal history of malignant neoplasm of thyroid: Secondary | ICD-10-CM | POA: Insufficient documentation

## 2011-03-27 LAB — COMPREHENSIVE METABOLIC PANEL
ALT: 19 U/L (ref 0–35)
Alkaline Phosphatase: 71 U/L (ref 39–117)
BUN: 38 mg/dL — ABNORMAL HIGH (ref 6–23)
Chloride: 102 mEq/L (ref 96–112)
GFR calc Af Amer: 39 mL/min — ABNORMAL LOW (ref >60)
Glucose, Bld: 103 mg/dL — ABNORMAL HIGH (ref 70–99)
Potassium: 5.1 mEq/L (ref 3.5–5.1)
Sodium: 138 mEq/L (ref 135–145)
Total Bilirubin: 0.4 mg/dL (ref 0.3–1.2)

## 2011-03-27 LAB — CBC
HCT: 39.9 % (ref 36.0–46.0)
Hemoglobin: 13.3 g/dL (ref 12.0–15.0)
RBC: 4.61 MIL/uL (ref 3.87–5.11)
WBC: 7.2 10*3/uL (ref 4.0–10.5)

## 2011-03-27 LAB — DIFFERENTIAL
Basophils Absolute: 0 10*3/uL (ref 0.0–0.1)
Lymphocytes Relative: 31 % (ref 12–46)
Neutro Abs: 4.1 10*3/uL (ref 1.7–7.7)
Neutrophils Relative %: 57 % (ref 43–77)

## 2011-03-27 LAB — LACTATE DEHYDROGENASE: LDH: 249 U/L (ref 94–250)

## 2011-07-19 ENCOUNTER — Ambulatory Visit (HOSPITAL_COMMUNITY): Payer: Self-pay | Admitting: Oncology

## 2011-09-25 ENCOUNTER — Other Ambulatory Visit (HOSPITAL_COMMUNITY): Payer: Self-pay | Admitting: Family Medicine

## 2011-09-25 DIAGNOSIS — Z139 Encounter for screening, unspecified: Secondary | ICD-10-CM

## 2011-09-27 ENCOUNTER — Other Ambulatory Visit (HOSPITAL_COMMUNITY): Payer: Self-pay | Admitting: Family Medicine

## 2011-09-27 ENCOUNTER — Ambulatory Visit (HOSPITAL_COMMUNITY)
Admission: RE | Admit: 2011-09-27 | Discharge: 2011-09-27 | Disposition: A | Discharge status: Home / Self Care | Payer: Medicare Other | Source: Ambulatory Visit | Attending: Family Medicine | Admitting: Family Medicine

## 2011-09-27 DIAGNOSIS — R059 Cough, unspecified: Secondary | ICD-10-CM | POA: Insufficient documentation

## 2011-09-27 DIAGNOSIS — R0602 Shortness of breath: Secondary | ICD-10-CM | POA: Insufficient documentation

## 2011-09-27 DIAGNOSIS — Z853 Personal history of malignant neoplasm of breast: Secondary | ICD-10-CM | POA: Insufficient documentation

## 2011-09-27 DIAGNOSIS — J189 Pneumonia, unspecified organism: Secondary | ICD-10-CM

## 2011-09-27 DIAGNOSIS — R05 Cough: Secondary | ICD-10-CM | POA: Insufficient documentation

## 2011-10-30 ENCOUNTER — Ambulatory Visit (HOSPITAL_COMMUNITY)
Admission: RE | Admit: 2011-10-30 | Discharge: 2011-10-30 | Disposition: A | Discharge status: Home / Self Care | Payer: Medicare Other | Source: Ambulatory Visit | Attending: Family Medicine | Admitting: Family Medicine

## 2011-10-30 DIAGNOSIS — Z1231 Encounter for screening mammogram for malignant neoplasm of breast: Secondary | ICD-10-CM | POA: Insufficient documentation

## 2011-10-30 DIAGNOSIS — Z139 Encounter for screening, unspecified: Secondary | ICD-10-CM

## 2012-03-26 ENCOUNTER — Encounter (HOSPITAL_COMMUNITY): Payer: Medicare Other | Attending: Oncology | Admitting: Oncology

## 2012-03-26 ENCOUNTER — Ambulatory Visit (HOSPITAL_COMMUNITY): Payer: Self-pay | Admitting: Oncology

## 2012-03-26 ENCOUNTER — Encounter (HOSPITAL_COMMUNITY): Payer: Self-pay | Admitting: Oncology

## 2012-03-26 VITALS — BP 121/71 | HR 74 | Temp 97.7°F | Wt 204.1 lb

## 2012-03-26 DIAGNOSIS — I82A19 Acute embolism and thrombosis of unspecified axillary vein: Secondary | ICD-10-CM

## 2012-03-26 DIAGNOSIS — I10 Essential (primary) hypertension: Secondary | ICD-10-CM | POA: Insufficient documentation

## 2012-03-26 DIAGNOSIS — Z8571 Personal history of Hodgkin lymphoma: Secondary | ICD-10-CM | POA: Insufficient documentation

## 2012-03-26 DIAGNOSIS — R413 Other amnesia: Secondary | ICD-10-CM | POA: Insufficient documentation

## 2012-03-26 DIAGNOSIS — Z86718 Personal history of other venous thrombosis and embolism: Secondary | ICD-10-CM | POA: Insufficient documentation

## 2012-03-26 DIAGNOSIS — Z85850 Personal history of malignant neoplasm of thyroid: Secondary | ICD-10-CM | POA: Insufficient documentation

## 2012-03-26 DIAGNOSIS — I509 Heart failure, unspecified: Secondary | ICD-10-CM | POA: Insufficient documentation

## 2012-03-26 DIAGNOSIS — Z853 Personal history of malignant neoplasm of breast: Secondary | ICD-10-CM | POA: Insufficient documentation

## 2012-03-26 DIAGNOSIS — C8190 Hodgkin lymphoma, unspecified, unspecified site: Secondary | ICD-10-CM

## 2012-03-26 LAB — DIFFERENTIAL
Eosinophils Absolute: 0.1 10*3/uL (ref 0.0–0.7)
Eosinophils Relative: 2 % (ref 0–5)
Lymphocytes Relative: 33 % (ref 12–46)
Lymphs Abs: 2 10*3/uL (ref 0.7–4.0)
Monocytes Absolute: 0.4 10*3/uL (ref 0.1–1.0)

## 2012-03-26 LAB — COMPREHENSIVE METABOLIC PANEL
ALT: 18 U/L (ref 0–35)
BUN: 38 mg/dL — ABNORMAL HIGH (ref 6–23)
CO2: 25 mEq/L (ref 19–32)
Calcium: 8.2 mg/dL — ABNORMAL LOW (ref 8.4–10.5)
Creatinine, Ser: 1.67 mg/dL — ABNORMAL HIGH (ref 0.50–1.10)
GFR calc Af Amer: 33 mL/min — ABNORMAL LOW (ref >90)
GFR calc non Af Amer: 28 mL/min — ABNORMAL LOW (ref >90)
Glucose, Bld: 87 mg/dL (ref 70–99)
Sodium: 138 mEq/L (ref 135–145)
Total Protein: 7.5 g/dL (ref 6.0–8.3)

## 2012-03-26 LAB — CBC
HCT: 35.7 % — ABNORMAL LOW (ref 36.0–46.0)
MCH: 28 pg (ref 26.0–34.0)
MCV: 87.1 fL (ref 78.0–100.0)
Platelets: 266 10*3/uL (ref 150–400)
RBC: 4.1 MIL/uL (ref 3.87–5.11)
RDW: 14.8 % (ref 11.5–15.5)

## 2012-03-26 NOTE — Patient Instructions (Addendum)
[name] [name]  [id number] [birth year] Dr. Everardo All   [facility]  Discharge Instructions  RECOMMENDATIONS MADE BY THE CONSULTANT AND ANY TEST RESULTS WILL BE SENT TO YOUR REFERRING DOCTOR.   EXAM FINDINGS BY MD TODAY AND SIGNS AND SYMPTOMS TO REPORT TO CLINIC OR PRIMARY MD: Exam and discussion by Dr. Tressie Stalker.  Will check some labs to see if we can find a cause for your memory loss.  MEDICATIONS PRESCRIBED: none   INSTRUCTIONS GIVEN AND DISCUSSED: Other :  Report any new lumps, bone pain, shortness of breath, night sweats, etc.  SPECIAL INSTRUCTIONS/FOLLOW-UP: Lab work Needed today and Return to Clinic in 1 year.   I acknowledge that I have been informed and understand all the instructions given to me and received a copy. I do not have any more questions at this time, but understand that I may call the Specialty Clinic at [facility] at [phone] during business hours should I have any further questions or need assistance in obtaining follow-up care.    __________________________________________  _____________  __________ Signature of Patient or Authorized Representative            Date                   Time    __________________________________________ Nurse's Signature

## 2012-03-26 NOTE — Progress Notes (Signed)
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  Problem #3 right-sided breast cancer diagnosed in 1987 treated with lumpectomy axillary node dissection with one positive node given stage II disease treated with radiation therapy postoperatively and adjuvant tamoxifen for 5 years by Dr. Laurena Spies and Freddi Che at [facility]. Problem #4 CHF problem #5 history of DVT of the left axillary vein treated with Coumadin in the past with resolution. Problem #6 hypertension problem #7 thyroid carcinoma status post thyroidectomy years ago  [place] is by herself today but she is having more more problems with memory. She has trouble with dates names faces events etc. She thought her last visit here was last autumn however it was one year ago to the day. She has an appointment with Dr. Karie Kirks in the near future but cannot remember the date. Her husband just received the [employer] of [employer] and she cannot remember who gave it to her husband or where he served during World War II even though she is her this many times before she states.  She is not having headaches nausea vomiting vital signs are stable etc. she has no B. symptomatology. Her vital signs are recorded. Her lymph nodes are negative throughout. Her right breast is small slightly more thickened but without masses. The left breast is without masses. Her lungs are clear heart is not reveal an S3 gallop or murmur at this time. Her abdomen shows no obvious her again in [place] bowel sounds are diminished but present she has no leg edema no arm edema.  I will send a note to Dr. Karie Kirks for him to be aware of her memory issues when he sees her and we'll check the lab work I will call him if anything is out of line.  Otherwise I will see her in one year

## 2012-03-26 NOTE — Progress Notes (Signed)
[name] presented for [employer]. Labs per MD order drawn via Peripheral Line 23 gauge needle inserted in left AC   Good blood return present. Procedure without incident.  Needle removed intact. Patient tolerated procedure well.

## 2012-03-27 LAB — VITAMIN B12: Vitamin B-12: 482 pg/mL (ref 211–911)

## 2012-03-27 LAB — FOLATE: Folate: >20 ng/mL

## 2012-03-27 LAB — TSH: TSH: 0.648 u[IU]/mL (ref 0.350–4.500)

## 2012-05-15 ENCOUNTER — Encounter (HOSPITAL_COMMUNITY): Payer: Self-pay | Admitting: Pharmacy Technician

## 2012-05-21 ENCOUNTER — Encounter (HOSPITAL_COMMUNITY)
Admission: RE | Admit: 2012-05-21 | Discharge: 2012-05-21 | Disposition: A | Discharge status: Home / Self Care | Payer: Medicare Other | Source: Ambulatory Visit | Attending: Ophthalmology | Admitting: Ophthalmology

## 2012-05-21 ENCOUNTER — Other Ambulatory Visit: Payer: Self-pay

## 2012-05-21 ENCOUNTER — Encounter (HOSPITAL_COMMUNITY): Payer: Self-pay

## 2012-05-21 LAB — BASIC METABOLIC PANEL
CO2: 27 mEq/L (ref 19–32)
Chloride: 101 mEq/L (ref 96–112)
Creatinine, Ser: 1.38 mg/dL — ABNORMAL HIGH (ref 0.50–1.10)
GFR calc Af Amer: 41 mL/min — ABNORMAL LOW (ref >90)
Sodium: 137 mEq/L (ref 135–145)

## 2012-05-21 LAB — HEMOGLOBIN AND HEMATOCRIT, BLOOD
HCT: 35.8 % — ABNORMAL LOW (ref 36.0–46.0)
Hemoglobin: 11.7 g/dL — ABNORMAL LOW (ref 12.0–15.0)

## 2012-05-21 NOTE — Patient Instructions (Signed)
[place]  05/21/2012   Your procedure is scheduled on:  05/28/12  Report to [facility] at 0730 AM.  Call this number if you have problems the morning of surgery: [phone]   Remember:   Do not eat food:After Midnight.  May have clear liquids:until Midnight .  Clear liquids include soda, tea, black coffee, apple or grape juice, broth.  Take these medicines the morning of surgery with A SIP OF WATER: synthroid, coreg, lisinopril   Do not wear jewelry, make-up or nail polish.  Do not wear lotions, powders, or perfumes. You may wear deodorant.  Do not shave 48 hours prior to surgery. Men may shave face and neck.  Do not bring valuables to the hospital.  Contacts, dentures or bridgework may not be worn into surgery.  Leave suitcase in the car. After surgery it may be brought to your room.  For patients admitted to the hospital, checkout time is 11:00 AM the day of discharge.   Patients discharged the day of surgery will not be allowed to drive home.  Name and phone number of your driver: family  Special Instructions: N/A   Please read over the following fact sheets that you were given: Surgical Site Infection Prevention, Anesthesia Post-op Instructions and Care and Recovery After Surgery   PATIENT INSTRUCTIONS POST-ANESTHESIA  IMMEDIATELY FOLLOWING SURGERY:  Do not drive or operate machinery for the first twenty four hours after surgery.  Do not make any important decisions for twenty four hours after surgery or while taking narcotic pain medications or sedatives.  If you develop intractable nausea and vomiting or a severe headache please notify your doctor immediately.  FOLLOW-UP:  Please make an appointment with your surgeon as instructed. You do not need to follow up with anesthesia unless specifically instructed to do so.  WOUND CARE INSTRUCTIONS (if applicable):  Keep a dry clean dressing on the anesthesia/puncture wound site if there is drainage.  Once the wound has quit  draining you may leave it open to air.  Generally you should leave the bandage intact for twenty four hours unless there is drainage.  If the epidural site drains for more than 36-48 hours please call the anesthesia department.  QUESTIONS?:  Please feel free to call your physician or the hospital operator if you have any questions, and they will be happy to assist you.      Cataract A cataract is a clouding of the lens of the eye. When a lens becomes cloudy, vision is reduced based on the degree and nature of the clouding. Many cataracts reduce vision to some degree. Some cataracts make people more near-sighted as they develop. Other cataracts increase glare. Cataracts that are ignored and become worse can sometimes look white. The white color can be seen through the pupil. CAUSES   Aging. However, cataracts may occur at any age, even in newborns.   Certain drugs.   Trauma to the eye.   Certain diseases such as diabetes.   Specific eye diseases such as chronic inflammation inside the eye or a sudden attack of a rare form of glaucoma.   Inherited or acquired medical problems.  SYMPTOMS   Gradual, progressive drop in vision in the affected eye.   Severe, rapid visual loss. This most often happens when trauma is the cause.  DIAGNOSIS  To detect a cataract, an eye doctor examines the lens. Cataracts are best diagnosed with an exam of the eyes with the pupils enlarged (dilated) by drops.  TREATMENT  For an early cataract, vision may improve by using different eyeglasses or stronger lighting. If that does not help your vision, surgery is the only effective treatment. A cataract needs to be surgically removed when vision loss interferes with your everyday activities, such as driving, reading, or watching TV. A cataract may also have to be removed if it prevents examination or treatment of another eye problem. Surgery removes the cloudy lens and usually replaces it with a substitute lens  (intraocular lens, IOL).  At a time when both you and your doctor agree, the cataract will be surgically removed. If you have cataracts in both eyes, only one is usually removed at a time. This allows the operated eye to heal and be out of danger from any possible problems after surgery (such as infection or poor wound healing). In rare cases, a cataract may be doing damage to your eye. In these cases, your caregiver may advise surgical removal right away. The vast majority of people who have cataract surgery have better vision afterward. HOME CARE INSTRUCTIONS  If you are not planning surgery, you may be asked to do the following:  Use different eyeglasses.   Use stronger or brighter lighting.   Ask your eye doctor about reducing your medicine dose or changing medicines if it is thought that a medicine caused your cataract. Changing medicines does not make the cataract go away on its own.   Become familiar with your surroundings. Poor vision can lead to injury. Avoid bumping into things on the affected side. You are at a higher risk for tripping or falling.   Exercise extreme care when driving or operating machinery.   Wear sunglasses if you are sensitive to bright light or experiencing problems with glare.  SEEK IMMEDIATE MEDICAL CARE IF:   You have a worsening or sudden vision loss.   You notice redness, swelling, or increasing pain in the eye.   You have a fever.  Document Released: 09/11/2005 Document Revised: 08/31/2011 Document Reviewed: 05/05/2011 [facility] Patient Information 2012 [place].

## 2012-05-21 NOTE — Progress Notes (Signed)
05/21/12 1120  OBSTRUCTIVE SLEEP APNEA  Have you ever been diagnosed with sleep apnea through a sleep study? No  Do you snore loudly (loud enough to be heard through closed doors)?  0  Do you often feel tired, fatigued, or sleepy during the daytime? 1  Has anyone observed you stop breathing during your sleep? 0  Do you have, or are you being treated for high blood pressure? 1  BMI more than 35 kg/m2? 0  Age over 76 years old? 1  Neck circumference greater than 40 cm/18 inches? 0  Gender: 1  Obstructive Sleep Apnea Score 4   Score 4 or greater  Updated health history;Results sent to PCP

## 2012-05-24 MED ORDER — CYCLOPENTOLATE-PHENYLEPHRINE 0.2-1 % OP SOLN
OPHTHALMIC | Status: AC
  Filled 2012-05-24: qty 2

## 2012-05-24 MED ORDER — PHENYLEPHRINE HCL 2.5 % OP SOLN
OPHTHALMIC | Status: AC
  Filled 2012-05-24: qty 2

## 2012-05-24 MED ORDER — TETRACAINE HCL 0.5 % OP SOLN
OPHTHALMIC | Status: AC
  Filled 2012-05-24: qty 2

## 2012-05-24 MED ORDER — FLURBIPROFEN SODIUM 0.03 % OP SOLN
OPHTHALMIC | Status: AC
  Filled 2012-05-24: qty 2.5

## 2012-05-24 MED ORDER — CYCLOPENTOLATE HCL 1 % OP SOLN: 1.0000 [drp] | OPHTHALMIC | Status: DC

## 2012-05-28 ENCOUNTER — Encounter (HOSPITAL_COMMUNITY): Payer: Self-pay | Admitting: *Deleted

## 2012-05-28 ENCOUNTER — Ambulatory Visit (HOSPITAL_COMMUNITY)
Admission: RE | Admit: 2012-05-28 | Discharge: 2012-05-28 | Disposition: A | Discharge status: Home / Self Care | Payer: Medicare Other | Source: Ambulatory Visit | Attending: Ophthalmology | Admitting: Ophthalmology

## 2012-05-28 ENCOUNTER — Encounter (HOSPITAL_COMMUNITY)
Admission: RE | Disposition: A | Discharge status: Home / Self Care | Payer: Self-pay | Source: Ambulatory Visit | Attending: Ophthalmology

## 2012-05-28 ENCOUNTER — Encounter (HOSPITAL_COMMUNITY): Payer: Self-pay | Admitting: Anesthesiology

## 2012-05-28 ENCOUNTER — Encounter (HOSPITAL_COMMUNITY): Payer: Self-pay | Admitting: Pharmacy Technician

## 2012-05-28 ENCOUNTER — Ambulatory Visit (HOSPITAL_COMMUNITY): Payer: Medicare Other | Admitting: Anesthesiology

## 2012-05-28 DIAGNOSIS — Z01812 Encounter for preprocedural laboratory examination: Secondary | ICD-10-CM | POA: Insufficient documentation

## 2012-05-28 DIAGNOSIS — H2510 Age-related nuclear cataract, unspecified eye: Secondary | ICD-10-CM | POA: Insufficient documentation

## 2012-05-28 DIAGNOSIS — Z01810 Encounter for preprocedural cardiovascular examination: Secondary | ICD-10-CM | POA: Insufficient documentation

## 2012-05-28 DIAGNOSIS — I10 Essential (primary) hypertension: Secondary | ICD-10-CM | POA: Insufficient documentation

## 2012-05-28 HISTORY — PX: CATARACT EXTRACTION W/PHACO: SHX586

## 2012-05-28 SURGERY — PHACOEMULSIFICATION, CATARACT, WITH IOL INSERTION
Anesthesia: Monitor Anesthesia Care | Site: Eye | Laterality: Right | Wound class: Clean

## 2012-05-28 MED ORDER — MIDAZOLAM HCL 2 MG/2ML IJ SOLN
INTRAMUSCULAR | Status: AC
  Filled 2012-05-28: qty 2

## 2012-05-28 MED ORDER — PROVISC 10 MG/ML IO SOLN
INTRAOCULAR | Status: DC | PRN
  Administered 2012-05-28: 8.5 mg via INTRAOCULAR

## 2012-05-28 MED ORDER — EPINEPHRINE HCL 1 MG/ML IJ SOLN
INTRAOCULAR | Status: DC | PRN
  Administered 2012-05-28: 09:00:00

## 2012-05-28 MED ORDER — FLURBIPROFEN SODIUM 0.03 % OP SOLN
1.0000 [drp] | OPHTHALMIC | Status: AC
  Administered 2012-05-28 (×3): 1 [drp] via OPHTHALMIC

## 2012-05-28 MED ORDER — EPINEPHRINE HCL 1 MG/ML IJ SOLN
INTRAMUSCULAR | Status: AC
  Filled 2012-05-28: qty 1

## 2012-05-28 MED ORDER — LACTATED RINGERS IV SOLN
INTRAVENOUS | Status: DC
  Administered 2012-05-28: 1000 mL via INTRAVENOUS

## 2012-05-28 MED ORDER — TETRACAINE HCL 0.5 % OP SOLN
OPHTHALMIC | Status: AC
  Filled 2012-05-28: qty 2

## 2012-05-28 MED ORDER — BSS IO SOLN
INTRAOCULAR | Status: DC | PRN
  Administered 2012-05-28: 15 mL via INTRAOCULAR

## 2012-05-28 MED ORDER — TETRACAINE HCL 0.5 % OP SOLN
1.0000 [drp] | OPHTHALMIC | Status: AC
  Administered 2012-05-28 (×3): 1 [drp] via OPHTHALMIC

## 2012-05-28 MED ORDER — MIDAZOLAM HCL 2 MG/2ML IJ SOLN
1.0000 mg | INTRAMUSCULAR | Status: DC | PRN
  Administered 2012-05-28: 2 mg via INTRAVENOUS

## 2012-05-28 MED ORDER — PHENYLEPHRINE HCL 2.5 % OP SOLN
OPHTHALMIC | Status: AC
  Filled 2012-05-28: qty 2

## 2012-05-28 MED ORDER — CYCLOPENTOLATE-PHENYLEPHRINE 0.2-1 % OP SOLN
1.0000 [drp] | OPHTHALMIC | Status: AC
  Administered 2012-05-28 (×3): 1 [drp] via OPHTHALMIC

## 2012-05-28 MED ORDER — PHENYLEPHRINE HCL 2.5 % OP SOLN
1.0000 [drp] | OPHTHALMIC | Status: AC
  Administered 2012-05-28 (×3): 1 [drp] via OPHTHALMIC

## 2012-05-28 MED ORDER — CYCLOPENTOLATE-PHENYLEPHRINE 0.2-1 % OP SOLN
OPHTHALMIC | Status: AC
  Filled 2012-05-28: qty 2

## 2012-05-28 MED ORDER — FLURBIPROFEN SODIUM 0.03 % OP SOLN
OPHTHALMIC | Status: AC
  Filled 2012-05-28: qty 2.5

## 2012-05-28 SURGICAL SUPPLY — 24 items
CAPSULAR TENSION RING-AMO (OPHTHALMIC RELATED) IMPLANT
CLOTH BEACON ORANGE TIMEOUT ST (SAFETY) ×2 IMPLANT
EYE SHIELD UNIVERSAL CLEAR (GAUZE/BANDAGES/DRESSINGS) ×2 IMPLANT
GLOVE BIO SURGEON STRL SZ 6.5 (GLOVE) IMPLANT
GLOVE ECLIPSE 6.5 STRL STRAW (GLOVE) IMPLANT
GLOVE ECLIPSE 7.0 STRL STRAW (GLOVE) IMPLANT
GLOVE EXAM NITRILE LRG STRL (GLOVE) IMPLANT
GLOVE EXAM NITRILE MD LF STRL (GLOVE) ×2 IMPLANT
GLOVE INDICATOR 6.5 STRL GRN (GLOVE) ×4 IMPLANT
GLOVE SKINSENSE NS SZ6.5 (GLOVE)
GLOVE SKINSENSE STRL SZ6.5 (GLOVE) IMPLANT
HEALON 5 0.6 ML (INTRAOCULAR LENS) IMPLANT
KIT VITRECTOMY (OPHTHALMIC RELATED) IMPLANT
PAD ARMBOARD 7.5X6 YLW CONV (MISCELLANEOUS) ×2 IMPLANT
PROC W NO LENS (INTRAOCULAR LENS)
PROC W SPEC LENS (INTRAOCULAR LENS)
PROCESS W NO LENS (INTRAOCULAR LENS) IMPLANT
PROCESS W SPEC LENS (INTRAOCULAR LENS) IMPLANT
RING MALYGIN (MISCELLANEOUS) IMPLANT
SIGHTPATH CAT PROC W REG LENS (Ophthalmic Related) ×2 IMPLANT
TAPE SURG TRANSPORE 1 IN (GAUZE/BANDAGES/DRESSINGS) ×1 IMPLANT
TAPE SURGICAL TRANSPORE 1 IN (GAUZE/BANDAGES/DRESSINGS) ×1
VISCOELASTIC ADDITIONAL (OPHTHALMIC RELATED) IMPLANT
WATER STERILE IRR 250ML POUR (IV SOLUTION) ×2 IMPLANT

## 2012-05-28 NOTE — Preoperative (Addendum)
Beta Blockers   Reason not to administer Beta Blockers: Bradycardia

## 2012-05-28 NOTE — H&P (Signed)
The patient was re examined and there is no change in the patients condition since the original H and P. 

## 2012-05-28 NOTE — Op Note (Signed)
Patient brought to the operating room and prepped and draped in the usual manner.  Lid speculum inserted in right eye.  Stab incision made at the twelve o'clock position.  Provisc instilled in the anterior chamber.   A 2.4 mm. Stab incision was made temporally.  An anterior capsulotomy was done with a bent 25 gauge needle.  The nucleus was hydrodissected.  The Phaco tip was inserted in the anterior chamber and the nucleus was emulsified.  CDE was 12.86.  The cortical material was then removed with the I and A tip.  Posterior capsule was the polished.  The anterior chamber was deepened with Provisc.  A 15.0 Diopter Rayner 570C IOL was then inserted in the capsular bag.  Provisc was then removed with the I and A tip.  The wound was then hydrated.  Patient sent to the Recovery Room in good condition with follow up in my office.

## 2012-05-28 NOTE — Anesthesia Postprocedure Evaluation (Signed)
  Anesthesia Post-op Note  Patient: [name]  Procedure(s) Performed: Procedure(s) (LRB): CATARACT EXTRACTION PHACO AND INTRAOCULAR LENS PLACEMENT (IOC) (Right)  Patient Location: PACU  Anesthesia Type: MAC  Level of Consciousness: awake, alert , oriented and patient cooperative  Airway and Oxygen Therapy: Patient Spontanous Breathing  Post-op Pain: none  Post-op Assessment: Post-op Vital signs reviewed, Patient's Cardiovascular Status Stable, Respiratory Function Stable and Patent Airway  Post-op Vital Signs: Reviewed and stable  Complications: No apparent anesthesia complications

## 2012-05-28 NOTE — Anesthesia Preprocedure Evaluation (Signed)
Anesthesia Evaluation  Patient identified by MRN, date of birth, ID band Patient awake    Reviewed: Allergy & Precautions, H&P , NPO status , Patient's Chart, lab work & pertinent test results, reviewed documented beta blocker date and time   History of Anesthesia Complications Negative for: history of anesthetic complications  Airway Mallampati: II      Dental  (+) Edentulous Upper   Pulmonary asthma ,  breath sounds clear to auscultation        Cardiovascular hypertension, Pt. on medications +CHF Rhythm:Regular Rate:Bradycardia     Neuro/Psych    GI/Hepatic   Endo/Other  Hypothyroidism   Renal/GU      Musculoskeletal   Abdominal   Peds  Hematology   Anesthesia Other Findings Hx Hodgkins lymphoma   Reproductive/Obstetrics                           Anesthesia Physical Anesthesia Plan  ASA: III  Anesthesia Plan: MAC   Post-op Pain Management:    Induction: Intravenous  Airway Management Planned: Nasal Cannula  Additional Equipment:   Intra-op Plan:   Post-operative Plan:   Informed Consent: I have reviewed the patients History and Physical, chart, labs and discussed the procedure including the risks, benefits and alternatives for the proposed anesthesia with the patient or authorized representative who has indicated his/her understanding and acceptance.     Plan Discussed with:   Anesthesia Plan Comments:         Anesthesia Quick Evaluation

## 2012-05-28 NOTE — Transfer of Care (Signed)
Immediate Anesthesia Transfer of Care Note  Patient: [name]  Procedure(s) Performed: Procedure(s) (LRB): CATARACT EXTRACTION PHACO AND INTRAOCULAR LENS PLACEMENT (IOC) (Right)  Patient Location: PACU  Anesthesia Type: MAC  Level of Consciousness: awake, alert , oriented and patient cooperative  Airway & Oxygen Therapy: Patient Spontanous Breathing  Post-op Assessment: Report given to PACU RN and Post -op Vital signs reviewed and stable  Post vital signs: Reviewed and stable  Complications: No apparent anesthesia complications

## 2012-05-28 NOTE — Anesthesia Procedure Notes (Signed)
Procedure Name: MAC Date/Time: 05/28/2012 9:06 AM Performed by: Antony Contras, Susana Duell L Pre-anesthesia Checklist: Patient identified, Patient being monitored, Emergency Drugs available, Timeout performed and Suction available Oxygen Delivery Method: Nasal cannula

## 2012-05-29 ENCOUNTER — Encounter (HOSPITAL_COMMUNITY): Payer: Self-pay

## 2012-05-29 ENCOUNTER — Encounter (HOSPITAL_COMMUNITY): Payer: Medicare Other

## 2012-05-31 ENCOUNTER — Encounter (HOSPITAL_COMMUNITY): Payer: Self-pay | Admitting: Ophthalmology

## 2012-06-03 MED ORDER — PHENYLEPHRINE HCL 2.5 % OP SOLN
OPHTHALMIC | Status: AC
  Filled 2012-06-03: qty 2

## 2012-06-03 MED ORDER — FLURBIPROFEN SODIUM 0.03 % OP SOLN
OPHTHALMIC | Status: AC
  Filled 2012-06-03: qty 2.5

## 2012-06-03 MED ORDER — TETRACAINE HCL 0.5 % OP SOLN
OPHTHALMIC | Status: AC
  Filled 2012-06-03: qty 2

## 2012-06-03 MED ORDER — CYCLOPENTOLATE HCL 1 % OP SOLN
OPHTHALMIC | Status: AC
  Filled 2012-06-03: qty 2

## 2012-06-04 ENCOUNTER — Encounter (HOSPITAL_COMMUNITY): Payer: Self-pay | Admitting: *Deleted

## 2012-06-04 ENCOUNTER — Encounter (HOSPITAL_COMMUNITY)
Admission: RE | Disposition: A | Discharge status: Home / Self Care | Payer: Self-pay | Source: Ambulatory Visit | Attending: Ophthalmology

## 2012-06-04 ENCOUNTER — Ambulatory Visit (HOSPITAL_COMMUNITY)
Admission: RE | Admit: 2012-06-04 | Discharge: 2012-06-04 | Disposition: A | Discharge status: Home / Self Care | Payer: Medicare Other | Source: Ambulatory Visit | Attending: Ophthalmology | Admitting: Ophthalmology

## 2012-06-04 ENCOUNTER — Encounter (HOSPITAL_COMMUNITY): Payer: Self-pay | Admitting: Anesthesiology

## 2012-06-04 ENCOUNTER — Ambulatory Visit (HOSPITAL_COMMUNITY): Payer: Medicare Other | Admitting: Anesthesiology

## 2012-06-04 DIAGNOSIS — H2510 Age-related nuclear cataract, unspecified eye: Secondary | ICD-10-CM | POA: Insufficient documentation

## 2012-06-04 DIAGNOSIS — I10 Essential (primary) hypertension: Secondary | ICD-10-CM | POA: Insufficient documentation

## 2012-06-04 HISTORY — PX: CATARACT EXTRACTION W/PHACO: SHX586

## 2012-06-04 SURGERY — PHACOEMULSIFICATION, CATARACT, WITH IOL INSERTION
Anesthesia: Monitor Anesthesia Care | Site: Eye | Laterality: Left | Wound class: Clean

## 2012-06-04 MED ORDER — EPINEPHRINE HCL 1 MG/ML IJ SOLN
INTRAOCULAR | Status: DC | PRN
  Administered 2012-06-04: 08:00:00

## 2012-06-04 MED ORDER — MIDAZOLAM HCL 2 MG/2ML IJ SOLN
1.0000 mg | INTRAMUSCULAR | Status: DC | PRN
  Administered 2012-06-04: 2 mg via INTRAVENOUS

## 2012-06-04 MED ORDER — TETRACAINE HCL 0.5 % OP SOLN
1.0000 [drp] | OPHTHALMIC | Status: AC
  Administered 2012-06-04 (×3): 1 [drp] via OPHTHALMIC

## 2012-06-04 MED ORDER — CYCLOPENTOLATE-PHENYLEPHRINE 0.2-1 % OP SOLN: 1.0000 [drp] | OPHTHALMIC | Status: AC

## 2012-06-04 MED ORDER — PROVISC 10 MG/ML IO SOLN
INTRAOCULAR | Status: DC | PRN
  Administered 2012-06-04: 8.5 mg via INTRAOCULAR

## 2012-06-04 MED ORDER — FLURBIPROFEN SODIUM 0.03 % OP SOLN
1.0000 [drp] | OPHTHALMIC | Status: AC
  Administered 2012-06-04 (×3): 1 [drp] via OPHTHALMIC

## 2012-06-04 MED ORDER — ONDANSETRON HCL 4 MG/2ML IJ SOLN: 4.0000 mg | Freq: Once | INTRAMUSCULAR | Status: DC | PRN

## 2012-06-04 MED ORDER — EPINEPHRINE HCL 1 MG/ML IJ SOLN
INTRAMUSCULAR | Status: AC
  Filled 2012-06-04: qty 1

## 2012-06-04 MED ORDER — LACTATED RINGERS IV SOLN
INTRAVENOUS | Status: DC
  Administered 2012-06-04: 1000 mL via INTRAVENOUS

## 2012-06-04 MED ORDER — LACTATED RINGERS IV SOLN: INTRAVENOUS | Status: DC

## 2012-06-04 MED ORDER — FENTANYL CITRATE 0.05 MG/ML IJ SOLN: 25.0000 ug | INTRAMUSCULAR | Status: DC | PRN

## 2012-06-04 MED ORDER — BSS IO SOLN
INTRAOCULAR | Status: DC | PRN
  Administered 2012-06-04: 15 mL via INTRAOCULAR

## 2012-06-04 MED ORDER — CYCLOPENTOLATE HCL 1 % OP SOLN
1.0000 [drp] | OPHTHALMIC | Status: AC
  Administered 2012-06-04 (×3): 1 [drp] via OPHTHALMIC

## 2012-06-04 MED ORDER — PHENYLEPHRINE HCL 2.5 % OP SOLN
1.0000 [drp] | OPHTHALMIC | Status: AC
  Administered 2012-06-04 (×3): 1 [drp] via OPHTHALMIC

## 2012-06-04 MED ORDER — LACTATED RINGERS IV SOLN
INTRAVENOUS | Status: DC | PRN
  Administered 2012-06-04: 07:00:00 via INTRAVENOUS

## 2012-06-04 MED ORDER — MIDAZOLAM HCL 2 MG/2ML IJ SOLN
INTRAMUSCULAR | Status: AC
  Filled 2012-06-04: qty 2

## 2012-06-04 MED ORDER — LIDOCAINE HCL 3.5 % OP GEL: 1.0000 "application " | Freq: Once | OPHTHALMIC | Status: DC

## 2012-06-04 SURGICAL SUPPLY — 22 items
CAPSULAR TENSION RING-AMO (OPHTHALMIC RELATED) IMPLANT
CLOTH BEACON ORANGE TIMEOUT ST (SAFETY) ×2 IMPLANT
EYE SHIELD UNIVERSAL CLEAR (GAUZE/BANDAGES/DRESSINGS) ×2 IMPLANT
GLOVE BIO SURGEON STRL SZ 6.5 (GLOVE) ×2 IMPLANT
GLOVE ECLIPSE 6.5 STRL STRAW (GLOVE) IMPLANT
GLOVE ECLIPSE 7.0 STRL STRAW (GLOVE) IMPLANT
GLOVE EXAM NITRILE LRG STRL (GLOVE) IMPLANT
GLOVE EXAM NITRILE MD LF STRL (GLOVE) ×2 IMPLANT
GLOVE SKINSENSE NS SZ6.5 (GLOVE)
GLOVE SKINSENSE STRL SZ6.5 (GLOVE) IMPLANT
HEALON 5 0.6 ML (INTRAOCULAR LENS) IMPLANT
KIT VITRECTOMY (OPHTHALMIC RELATED) IMPLANT
PAD ARMBOARD 7.5X6 YLW CONV (MISCELLANEOUS) ×2 IMPLANT
PROC W NO LENS (INTRAOCULAR LENS)
PROC W SPEC LENS (INTRAOCULAR LENS)
PROCESS W NO LENS (INTRAOCULAR LENS) IMPLANT
PROCESS W SPEC LENS (INTRAOCULAR LENS) IMPLANT
RING MALYGIN (MISCELLANEOUS) IMPLANT
SIGHTPATH CAT PROC W REG LENS (Ophthalmic Related) ×2 IMPLANT
TAPE CLOTH SOFT 2X10 (GAUZE/BANDAGES/DRESSINGS) ×2 IMPLANT
VISCOELASTIC ADDITIONAL (OPHTHALMIC RELATED) IMPLANT
WATER STERILE IRR 250ML POUR (IV SOLUTION) ×2 IMPLANT

## 2012-06-04 NOTE — Anesthesia Preprocedure Evaluation (Signed)
Anesthesia Evaluation  Patient identified by MRN, date of birth, ID band Patient awake    Reviewed: Allergy & Precautions, H&P , NPO status , Patient's Chart, lab work & pertinent test results, reviewed documented beta blocker date and time   History of Anesthesia Complications Negative for: history of anesthetic complications  Airway Mallampati: II      Dental  (+) Edentulous Upper   Pulmonary asthma ,  breath sounds clear to auscultation        Cardiovascular hypertension, Pt. on medications +CHF Rhythm:Regular Rate:Bradycardia     Neuro/Psych    GI/Hepatic   Endo/Other  Hypothyroidism   Renal/GU      Musculoskeletal   Abdominal   Peds  Hematology   Anesthesia Other Findings Hx Hodgkins lymphoma   Reproductive/Obstetrics                           Anesthesia Physical Anesthesia Plan  ASA: III  Anesthesia Plan: MAC   Post-op Pain Management:    Induction: Intravenous  Airway Management Planned: Nasal Cannula  Additional Equipment:   Intra-op Plan:   Post-operative Plan:   Informed Consent: I have reviewed the patients History and Physical, chart, labs and discussed the procedure including the risks, benefits and alternatives for the proposed anesthesia with the patient or authorized representative who has indicated his/her understanding and acceptance.     Plan Discussed with:   Anesthesia Plan Comments:         Anesthesia Quick Evaluation

## 2012-06-04 NOTE — H&P (Signed)
The patient was re examined and there is no change in the patients condition since the original H and P. 

## 2012-06-04 NOTE — Anesthesia Postprocedure Evaluation (Signed)
  Anesthesia Post-op Note  Patient: [name]  Procedure(s) Performed: Procedure(s) (LRB) with comments: CATARACT EXTRACTION PHACO AND INTRAOCULAR LENS PLACEMENT ([facility]) (Left) - CDE:15.32  Patient Location: Short stay  Anesthesia Type: MAC  Level of Consciousness: awake, alert , oriented and patient cooperative  Airway and Oxygen Therapy: Patient Spontanous Breathing  Post-op Pain: none  Post-op Assessment: Post-op Vital signs reviewed, Patient's Cardiovascular Status Stable, Respiratory Function Stable and Patent Airway  Post-op Vital Signs: Reviewed and stable  Complications: No apparent anesthesia complications

## 2012-06-04 NOTE — Preoperative (Signed)
Beta Blockers   Reason not to administer Beta Blockers:Not Applicable 

## 2012-06-04 NOTE — Transfer of Care (Signed)
Immediate Anesthesia Transfer of Care Note  Patient: [name]  Procedure(s) Performed: Procedure(s) (LRB) with comments: CATARACT EXTRACTION PHACO AND INTRAOCULAR LENS PLACEMENT ([place]) (Left) - CDE:15.32  Patient Location:Short stay  Anesthesia Type: MAC  Level of Consciousness: awake, alert , oriented and patient cooperative  Airway & Oxygen Therapy: Patient Spontanous Breathing  Post-op Assessment: Report given to PACU RN and Post -op Vital signs reviewed and stable  Post vital signs: Reviewed and stable  Complications: No apparent anesthesia complications

## 2012-06-04 NOTE — Op Note (Signed)
Patient brought to the operating room and prepped and draped in the usual manner.  Lid speculum inserted in left eye.  Stab incision made at the twelve o'clock position.  Provisc instilled in the anterior chamber.   A 2.4 mm. Stab incision was made temporally.  An anterior capsulotomy was done with a bent 25 gauge needle.  The nucleus was hydrodissected.  The Phaco tip was inserted in the anterior chamber and the nucleus was emulsified.  CDE was 15.32.  The cortical material was then removed with the I and A tip.  Posterior capsule was the polished.  The anterior chamber was deepened with Provisc.  A 15.0 Diopter Rayner 570C IOL was then inserted in the capsular bag.  Provisc was then removed with the I and A tip.  The wound was then hydrated.  Patient sent to the Recovery Room in good condition with follow up in my office.

## 2012-06-06 ENCOUNTER — Encounter (HOSPITAL_COMMUNITY): Payer: Self-pay | Admitting: Ophthalmology

## 2012-08-14 ENCOUNTER — Other Ambulatory Visit (HOSPITAL_COMMUNITY): Payer: Self-pay | Admitting: Family Medicine

## 2012-08-14 DIAGNOSIS — M8580 Other specified disorders of bone density and structure, unspecified site: Secondary | ICD-10-CM

## 2012-08-20 ENCOUNTER — Ambulatory Visit (HOSPITAL_COMMUNITY)
Admission: RE | Admit: 2012-08-20 | Discharge: 2012-08-20 | Disposition: A | Discharge status: Home / Self Care | Payer: Medicare Other | Source: Ambulatory Visit | Attending: Family Medicine | Admitting: Family Medicine

## 2012-08-20 DIAGNOSIS — Z780 Asymptomatic menopausal state: Secondary | ICD-10-CM | POA: Insufficient documentation

## 2012-08-20 DIAGNOSIS — M899 Disorder of bone, unspecified: Secondary | ICD-10-CM | POA: Insufficient documentation

## 2012-08-20 DIAGNOSIS — M8580 Other specified disorders of bone density and structure, unspecified site: Secondary | ICD-10-CM

## 2012-08-20 DIAGNOSIS — E559 Vitamin D deficiency, unspecified: Secondary | ICD-10-CM | POA: Insufficient documentation

## 2012-10-08 ENCOUNTER — Other Ambulatory Visit (HOSPITAL_COMMUNITY): Payer: Self-pay | Admitting: Family Medicine

## 2012-10-08 DIAGNOSIS — Z139 Encounter for screening, unspecified: Secondary | ICD-10-CM

## 2012-11-04 ENCOUNTER — Ambulatory Visit (HOSPITAL_COMMUNITY)
Admission: RE | Admit: 2012-11-04 | Discharge: 2012-11-04 | Disposition: A | Discharge status: Home / Self Care | Payer: Medicare Other | Source: Ambulatory Visit | Attending: Family Medicine | Admitting: Family Medicine

## 2012-11-04 DIAGNOSIS — Z1231 Encounter for screening mammogram for malignant neoplasm of breast: Secondary | ICD-10-CM | POA: Insufficient documentation

## 2012-11-04 DIAGNOSIS — Z139 Encounter for screening, unspecified: Secondary | ICD-10-CM

## 2012-11-11 ENCOUNTER — Ambulatory Visit (HOSPITAL_COMMUNITY)
Admission: RE | Admit: 2012-11-11 | Discharge: 2012-11-11 | Disposition: A | Discharge status: Home / Self Care | Payer: Medicare Other | Source: Ambulatory Visit | Attending: Family Medicine | Admitting: Family Medicine

## 2012-11-11 ENCOUNTER — Other Ambulatory Visit (HOSPITAL_COMMUNITY): Payer: Self-pay | Admitting: Family Medicine

## 2012-11-11 DIAGNOSIS — J40 Bronchitis, not specified as acute or chronic: Secondary | ICD-10-CM

## 2012-11-11 DIAGNOSIS — R05 Cough: Secondary | ICD-10-CM | POA: Insufficient documentation

## 2012-11-11 DIAGNOSIS — R059 Cough, unspecified: Secondary | ICD-10-CM | POA: Insufficient documentation

## 2013-02-28 ENCOUNTER — Other Ambulatory Visit: Payer: Self-pay | Admitting: Cardiovascular Disease

## 2013-02-28 LAB — CBC WITH DIFFERENTIAL/PLATELET
Basophils Absolute: 0.1 10*3/uL (ref 0.0–0.1)
Basophils Relative: 1 % (ref 0–1)
Eosinophils Relative: 4 % (ref 0–5)
HCT: 36.5 % (ref 36.0–46.0)
MCHC: 32.9 g/dL (ref 30.0–36.0)
MCV: 84.3 fL (ref 78.0–100.0)
Monocytes Absolute: 0.4 10*3/uL (ref 0.1–1.0)
Neutro Abs: 2.3 10*3/uL (ref 1.7–7.7)
RDW: 15.8 % — ABNORMAL HIGH (ref 11.5–15.5)

## 2013-02-28 LAB — COMPREHENSIVE METABOLIC PANEL
AST: 22 U/L (ref 0–37)
Alkaline Phosphatase: 56 U/L (ref 39–117)
BUN: 31 mg/dL — ABNORMAL HIGH (ref 6–23)
Creat: 1.62 mg/dL — ABNORMAL HIGH (ref 0.50–1.10)
Total Bilirubin: 0.6 mg/dL (ref 0.3–1.2)

## 2013-02-28 LAB — LIPID PANEL
HDL: 70 mg/dL (ref >39)
LDL Cholesterol: 96 mg/dL (ref 0–99)
Total CHOL/HDL Ratio: 2.5 Ratio

## 2013-02-28 LAB — TSH: TSH: 4.653 u[IU]/mL — ABNORMAL HIGH (ref 0.350–4.500)

## 2013-03-05 ENCOUNTER — Ambulatory Visit (HOSPITAL_COMMUNITY)
Admission: RE | Admit: 2013-03-05 | Discharge: 2013-03-05 | Disposition: A | Discharge status: Home / Self Care | Payer: Medicare Other | Source: Ambulatory Visit | Attending: Cardiovascular Disease | Admitting: Cardiovascular Disease

## 2013-03-05 DIAGNOSIS — I428 Other cardiomyopathies: Secondary | ICD-10-CM | POA: Insufficient documentation

## 2013-03-05 DIAGNOSIS — Z6829 Body mass index (BMI) 29.0-29.9, adult: Secondary | ICD-10-CM | POA: Insufficient documentation

## 2013-03-05 DIAGNOSIS — I509 Heart failure, unspecified: Secondary | ICD-10-CM | POA: Insufficient documentation

## 2013-03-05 DIAGNOSIS — I10 Essential (primary) hypertension: Secondary | ICD-10-CM | POA: Insufficient documentation

## 2013-03-05 NOTE — Progress Notes (Signed)
*  PRELIMINARY RESULTS* Echocardiogram 2D Echocardiogram has been performed.  [name] 03/05/2013, 12:51 PM

## 2013-03-06 ENCOUNTER — Encounter: Payer: Self-pay | Admitting: Cardiovascular Disease

## 2013-03-26 ENCOUNTER — Encounter (HOSPITAL_COMMUNITY): Payer: Self-pay

## 2013-03-26 ENCOUNTER — Encounter (HOSPITAL_COMMUNITY): Payer: Medicare Other | Attending: Oncology

## 2013-03-26 VITALS — BP 107/68 | HR 72 | Temp 98.2°F | Resp 20 | Wt 210.5 lb

## 2013-03-26 DIAGNOSIS — C50919 Malignant neoplasm of unspecified site of unspecified female breast: Secondary | ICD-10-CM

## 2013-03-26 DIAGNOSIS — C50911 Malignant neoplasm of unspecified site of right female breast: Secondary | ICD-10-CM

## 2013-03-26 NOTE — Progress Notes (Signed)
Patient returns to clinic today for routine yearly evaluation her history of breast cancer as well as Hodgkin's disease. She continues to complain of memory problems, but otherwise has felt well. She denies any recent fevers or illnesses. She has good appetite and denies weight loss. She has no neurologic complaints. She denies any fevers or night sweats. She has no chest pain or shortness of breath. She denies any nausea, vomiting, constipation, or diarrhea. She has no urinary complaints. Patient is at her baseline and offers no further specific complaints today.   GENERAL: No distress, well nourished.  SKIN:  No rashes or significant lesions noted  HEAD: Normocephalic, No masses, lesions, or abnormalities  EYES: Conjunctiva are pink and non-injected  LYMPH: No palpable lymphadenopathy  BREAST: Bilateral breast and axilla without lumps or masses. LUNGS: Clear to auscultation, no crackles or wheezes HEART: Regular rate & rhythm, no murmurs, no gallops, S1 normal and S2 normal  ABDOMEN: Abdomen soft, non-tender, normal bowel sounds EXTREMITIES: No edema, no skin discoloration or tenderness NEURO: Alert & oriented, no focal motor/sensory deficits.   Problem #1 decrease in memory: Chronic.  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  No evidence of disease. Problem #3 right-sided breast cancer diagnosed in 1987 treated with lumpectomy axillary node dissection with one positive node given stage II disease treated with radiation therapy postoperatively and adjuvant tamoxifen for 5 years by Dr. Laurena Spies and Freddi Che at [facility]. No evidence of disease. Problem #4 CHF Problem #5 history of DVT of the left axillary vein treated with Coumadin in the past with resolution.  Problem #6 hypertension: Continue current medications Problem #7 thyroid  carcinoma status post thyroidectomy years ago   Return to clinic in one year for routine evaluation. The patient understands that she can return to clinic at any time if she has any questions, concerns, or complaints.

## 2013-03-26 NOTE — Patient Instructions (Addendum)
[place] Discharge Instructions  RECOMMENDATIONS MADE BY THE CONSULTANT AND ANY TEST RESULTS WILL BE SENT TO YOUR REFERRING PHYSICIAN.  You are doing GREAT! Return to clinic in 1 year to see MD.  Thank you for choosing [facility] to provide your oncology and hematology care.  To afford each patient quality time with our providers, please arrive at least 15 minutes before your scheduled appointment time.  With your help, our goal is to use those 15 minutes to complete the necessary work-up to ensure our physicians have the information they need to help with your evaluation and healthcare recommendations.    Effective January 1st, 2014, we ask that you re-schedule your appointment with our physicians should you arrive 10 or more minutes late for your appointment.  We strive to give you quality time with our providers, and arriving late affects you and other patients whose appointments are after yours.    Again, thank you for choosing [facility].  Our hope is that these requests will decrease the amount of time that you wait before being seen by our physicians.       _____________________________________________________________  Should you have questions after your visit to [facility], please contact our office at [phone] between the hours of 8:30 a.m. and 5:00 p.m.  Voicemails left after 4:30 p.m. will not be returned until the following business day.  For prescription refill requests, have your pharmacy contact our office with your prescription refill request.

## 2013-07-28 ENCOUNTER — Other Ambulatory Visit: Payer: Self-pay | Admitting: *Deleted

## 2013-07-28 DIAGNOSIS — C8190 Hodgkin lymphoma, unspecified, unspecified site: Secondary | ICD-10-CM

## 2013-07-28 MED ORDER — SIMVASTATIN 40 MG PO TABS: 40.0000 mg | ORAL_TABLET | Freq: Every day | ORAL | Status: DC

## 2013-10-07 ENCOUNTER — Other Ambulatory Visit (HOSPITAL_COMMUNITY): Payer: Self-pay | Admitting: Family Medicine

## 2013-10-07 DIAGNOSIS — Z139 Encounter for screening, unspecified: Secondary | ICD-10-CM

## 2013-11-06 ENCOUNTER — Other Ambulatory Visit (HOSPITAL_COMMUNITY): Payer: Self-pay | Admitting: Family Medicine

## 2013-11-06 DIAGNOSIS — Z1231 Encounter for screening mammogram for malignant neoplasm of breast: Secondary | ICD-10-CM

## 2013-11-07 ENCOUNTER — Ambulatory Visit (HOSPITAL_COMMUNITY)
Admission: RE | Admit: 2013-11-07 | Discharge: 2013-11-07 | Disposition: A | Discharge status: Home / Self Care | Payer: Medicare HMO | Source: Ambulatory Visit | Attending: Family Medicine | Admitting: Family Medicine

## 2013-11-07 DIAGNOSIS — Z1231 Encounter for screening mammogram for malignant neoplasm of breast: Secondary | ICD-10-CM | POA: Insufficient documentation

## 2014-02-13 ENCOUNTER — Encounter: Payer: Self-pay | Admitting: Cardiovascular Disease

## 2014-02-13 ENCOUNTER — Ambulatory Visit (INDEPENDENT_AMBULATORY_CARE_PROVIDER_SITE_OTHER): Payer: Commercial Managed Care - HMO | Admitting: Cardiovascular Disease

## 2014-02-13 VITALS — BP 138/80 | HR 65 | Ht 69.0 in | Wt 213.1 lb

## 2014-02-13 DIAGNOSIS — I2510 Atherosclerotic heart disease of native coronary artery without angina pectoris: Secondary | ICD-10-CM

## 2014-02-13 DIAGNOSIS — I428 Other cardiomyopathies: Secondary | ICD-10-CM

## 2014-02-13 NOTE — Patient Instructions (Signed)
Dr. Sallyanne Kuster recommends that you schedule a follow-up appointment in:   Dr. Harl Bowie in the [place] office in 6 months.

## 2014-02-16 DIAGNOSIS — I2510 Atherosclerotic heart disease of native coronary artery without angina pectoris: Secondary | ICD-10-CM | POA: Insufficient documentation

## 2014-02-16 DIAGNOSIS — I428 Other cardiomyopathies: Secondary | ICD-10-CM | POA: Insufficient documentation

## 2014-02-16 NOTE — Progress Notes (Signed)
Patient ID: [name], female   DOB: 1930-03-05, 80 y.o.   MRN: [id number]      Reason for office visit Mrs. Sheran Spine is a former patient of Dr. Terance Ice here to establish cardiology followup. However she told me from the beginning that she has difficulty traveling to [facility] and would like to find a cardiologist in refill. Will make arrangements for her to followup with Dr. Harl Bowie or Dr. Bronson Ing.  She was followed by Dr. Rollene Fare for nonischemic cardiomyopathy likely related to previous Adriamycin therapy. She had breast cancer 1987 and underwent mastectomy with axillary node dissection and chemotherapy. She has been free of disease since. In 2000 she had Hodgkin's disease which resolved with chemotherapy and has undergone thyroidectomy for thyroid cancer.   Her most recent echocardiogram in June 2014 showed much improved EF of 50-55% (in the past as low as 30%). She does not have overt clinical heart failure. In fact she exercises most days of the week, sometimes for much as 3 hours and one day using the treadmill the weight machines and exercising in the pool.  Coronary angiography performed in 2004 showed a 60% mid LAD lesion without other meaningful stenoses. She does not have angina pectoris. Her most recent nuclear stress test was performed in 2011 and was felt to be a low risk study with diaphragmatic attenuation artifact. The ejection fraction was 33%. The echocardiogram was performed subsequent to this.  She also has peripheral arterial disease and underwent a stent to the left common iliac in 2006. Repeated duplex ultrasonography has shown no evidence of progression of disease.  He denies chest pain or shortness of breath either at rest or with exertion and does not have palpitations, edema, intermittent claudication, dizziness or syncope.   No Known Allergies  Current Outpatient Prescriptions  Medication Sig Dispense Refill  . Calcium Carbonate-Vitamin D  (CALCIUM 600 + D PO) Take 1 tablet by mouth daily.      Marland Kitchen levothyroxine (SYNTHROID, LEVOTHROID) 100 MCG tablet Take 100 mcg by mouth daily.      Marland Kitchen lisinopril (PRINIVIL,ZESTRIL) 10 MG tablet Take 10 mg by mouth daily.      . Multiple Vitamins-Calcium (ONE-A-DAY WOMENS PO) Take 1 tablet by mouth daily.      . simvastatin (ZOCOR) 40 MG tablet Take 1 tablet (40 mg total) by mouth at bedtime.  90 tablet  3  . spironolactone (ALDACTONE) 25 MG tablet Take 12.5 mg by mouth daily.       . temazepam (RESTORIL) 30 MG capsule Take 30 mg by mouth at bedtime.      . vitamin C (ASCORBIC ACID) 500 MG tablet Take 500 mg by mouth daily.       No current facility-administered medications for this visit.    Past Medical History  Diagnosis Date  . CHF (congestive heart failure)   . Hodgkin's disease   . Breast cancer 1987    right sided  . DVT (deep venous thrombosis)   . Obesity   . Hypertension   . Thyroid disease     hypothyroidism  . Thyroid carcinoma   . Asthma   . Osteoporosis     Past Surgical History  Procedure Laterality Date  . Mastectomy partial / lumpectomy w/ axillary lymphadenectomy  1987    right sided  . Angioplasty / stenting femoral  07/13/2005    left  . Thyroidectomy    . Bunionectomy      right foot  . Abdominal hysterectomy    .  Colonoscopy    . Cardiac catheterization    . Appendectomy    . Tonsillectomy    . Cataract extraction w/phaco  05/28/2012    Procedure: CATARACT EXTRACTION PHACO AND INTRAOCULAR LENS PLACEMENT (I[facility]);  Surgeon: Elta Guadeloupe T. Gershon Crane, MD;  Location: [facility];  Service: Ophthalmology;  Laterality: Right;  CDE 12.86  . Cataract extraction w/phaco  06/04/2012    Procedure: CATARACT EXTRACTION PHACO AND INTRAOCULAR LENS PLACEMENT (I[facility]);  Surgeon: Elta Guadeloupe T. Gershon Crane, MD;  Location: [facility];  Service: Ophthalmology;  Laterality: Left;  CDE:15.32    Family History  Problem Relation Age of Onset  . Cancer Mother     lung cancer  . Heart attack Father      History   Social History  . Marital Status: Married    Spouse Name: N/A    Number of Children: N/A  . Years of Education: N/A   Occupational History  . Not on file.   Social History Main Topics  . Smoking status: Never Smoker   . Smokeless tobacco: Never Used  . Alcohol Use: No  . Drug Use: No  . Sexual Activity: No   Other Topics Concern  . Not on file   Social History Narrative  . No narrative on file    Review of systems: The patient specifically denies any chest pain at rest or with exertion, dyspnea at rest or with exertion, orthopnea, paroxysmal nocturnal dyspnea, syncope, palpitations, focal neurological deficits, intermittent claudication, lower extremity edema, unexplained weight gain, cough, hemoptysis or wheezing.  The patient also denies abdominal pain, nausea, vomiting, dysphagia, diarrhea, constipation, polyuria, polydipsia, dysuria, hematuria, frequency, urgency, abnormal bleeding or bruising, fever, chills, unexpected weight changes, mood swings, change in skin or hair texture, change in voice quality, auditory or visual problems, allergic reactions or rashes, new musculoskeletal complaints other than usual "aches and pains".   PHYSICAL EXAM BP 138/80  Pulse 65  Ht 5\' 9"  (1.753 m)  Wt 213 lb 1.6 oz (96.662 kg)  BMI 31.46 kg/m2  General: Alert, oriented x3, no distress Head: no evidence of trauma, PERRL, EOMI, no exophtalmos or lid lag, no myxedema, no xanthelasma; normal ears, nose and oropharynx Neck: normal jugular venous pulsations and no hepatojugular reflux; brisk carotid pulses without delay and no carotid bruits Chest: clear to auscultation, no signs of consolidation by percussion or palpation, normal fremitus, symmetrical and full respiratory excursions Cardiovascular: normal position and quality of the apical impulse, regular rhythm, normal first and second heart sounds, no murmurs, rubs or gallops Abdomen: no tenderness or distention, no  masses by palpation, no abnormal pulsatility or arterial bruits, normal bowel sounds, no hepatosplenomegaly Extremities: no clubbing, cyanosis or edema; 2+ radial, ulnar and brachial pulses bilaterally; 2+ right femoral, posterior tibial and dorsalis pedis pulses; 2+ left femoral, posterior tibial and dorsalis pedis pulses; no subclavian or femoral bruits Neurological: grossly nonfocal   EKG: Normal sinus rhythm, nonspecific T wave flattening especially in the high lateral leads  Lipid Panel   November 2014 from Dr. Karie Kirks creatinine 1.3, BUN 26, hemoglobin 11.8, no other lipid profile    Component Value Date/Time   CHOL 176 02/28/2013 1013   TRIG 49 02/28/2013 1013   HDL 70 02/28/2013 1013   CHOLHDL 2.5 02/28/2013 1013   VLDL 10 02/28/2013 1013   LDLCALC 96 02/28/2013 1013    BMET    Component Value Date/Time   NA 139 02/28/2013 1013   K 4.9 02/28/2013 1013   CL 104 02/28/2013 1013  CO2 25 02/28/2013 1013   GLUCOSE 94 02/28/2013 1013   BUN 31* 02/28/2013 1013   CREATININE 1.62* 02/28/2013 1013   CREATININE 1.38* 05/21/2012 1130   CALCIUM 7.9* 02/28/2013 1013   GFRNONAA 35* 05/21/2012 1130   GFRAA 41* 05/21/2012 1130     ASSESSMENT AND PLAN  Mrs. Sheran Spine has well compensated nonischemic cardiomyopathy, NYHA functional class I without need for loop diuretic therapy. She's taking ACE inhibitors, spironolactone and was taking carvedilol when she last saw Dr. Rollene Fare in June of 2014. That medication is also listed as being active when she last saw her primary care physician in November. However the patient is uncertain whether she is still taking this.  There is also wide variation assessment of her left ventricular systolic function. This was moderately depressed at 33% by nuclear scintigraphy in 2011 but almost normal at 50-55% by echocardiography in June of 2014. From a clinical standpoint she fits better with the higher evaluation.  At this point in time no changes appear to be necessary in her  medical regimen, although we do need to clarify whether or not she is receiving carvedilol. We'll make arrangements for her to follow up in [place] as is her request.  She has moderate CAD with documented 60% stenosis in the mid LAD artery, asymptomatic and without ischemia on her most recent nuclear stress test. Her lipid profile is fair on the current treatment with statin  Orders Placed This Encounter  Procedures  . EKG 12-Lead   Meds ordered this encounter  Medications  . temazepam (RESTORIL) 30 MG capsule    Sig: Take 30 mg by mouth at bedtime.    Chauna Osoria  Sanda Klein, MD, [facility] [phone] office [phone] pager

## 2014-03-25 ENCOUNTER — Encounter (HOSPITAL_COMMUNITY): Payer: Medicare HMO | Attending: Hematology and Oncology

## 2014-03-25 ENCOUNTER — Encounter (HOSPITAL_BASED_OUTPATIENT_CLINIC_OR_DEPARTMENT_OTHER): Payer: Medicare HMO

## 2014-03-25 ENCOUNTER — Encounter (HOSPITAL_COMMUNITY): Payer: Self-pay

## 2014-03-25 VITALS — BP 114/71 | HR 77 | Temp 98.1°F | Resp 16 | Wt 209.6 lb

## 2014-03-25 DIAGNOSIS — Z8571 Personal history of Hodgkin lymphoma: Secondary | ICD-10-CM

## 2014-03-25 DIAGNOSIS — I10 Essential (primary) hypertension: Secondary | ICD-10-CM | POA: Diagnosis not present

## 2014-03-25 DIAGNOSIS — Z85850 Personal history of malignant neoplasm of thyroid: Secondary | ICD-10-CM

## 2014-03-25 DIAGNOSIS — Z853 Personal history of malignant neoplasm of breast: Secondary | ICD-10-CM | POA: Diagnosis not present

## 2014-03-25 DIAGNOSIS — Z86718 Personal history of other venous thrombosis and embolism: Secondary | ICD-10-CM | POA: Diagnosis not present

## 2014-03-25 DIAGNOSIS — Z923 Personal history of irradiation: Secondary | ICD-10-CM | POA: Diagnosis not present

## 2014-03-25 DIAGNOSIS — Z9861 Coronary angioplasty status: Secondary | ICD-10-CM | POA: Diagnosis not present

## 2014-03-25 DIAGNOSIS — Z9010 Acquired absence of unspecified breast and nipple: Secondary | ICD-10-CM | POA: Insufficient documentation

## 2014-03-25 DIAGNOSIS — R609 Edema, unspecified: Secondary | ICD-10-CM

## 2014-03-25 DIAGNOSIS — E0789 Other specified disorders of thyroid: Secondary | ICD-10-CM | POA: Insufficient documentation

## 2014-03-25 DIAGNOSIS — C50911 Malignant neoplasm of unspecified site of right female breast: Secondary | ICD-10-CM

## 2014-03-25 DIAGNOSIS — D638 Anemia in other chronic diseases classified elsewhere: Secondary | ICD-10-CM

## 2014-03-25 DIAGNOSIS — C8190 Hodgkin lymphoma, unspecified, unspecified site: Secondary | ICD-10-CM

## 2014-03-25 LAB — CBC WITH DIFFERENTIAL/PLATELET
Basophils Absolute: 0 10*3/uL (ref 0.0–0.1)
Basophils Relative: 0 % (ref 0–1)
EOS PCT: 1 % (ref 0–5)
Eosinophils Absolute: 0.1 10*3/uL (ref 0.0–0.7)
HCT: 36.4 % (ref 36.0–46.0)
HEMOGLOBIN: 11.9 g/dL — AB (ref 12.0–15.0)
LYMPHS ABS: 2.1 10*3/uL (ref 0.7–4.0)
LYMPHS PCT: 28 % (ref 12–46)
MCH: 28.3 pg (ref 26.0–34.0)
MCHC: 32.7 g/dL (ref 30.0–36.0)
MCV: 86.7 fL (ref 78.0–100.0)
MONOS PCT: 8 % (ref 3–12)
Monocytes Absolute: 0.6 10*3/uL (ref 0.1–1.0)
Neutro Abs: 4.7 10*3/uL (ref 1.7–7.7)
Neutrophils Relative %: 63 % (ref 43–77)
PLATELETS: 271 10*3/uL (ref 150–400)
RBC: 4.2 MIL/uL (ref 3.87–5.11)
RDW: 15 % (ref 11.5–15.5)
WBC: 7.6 10*3/uL (ref 4.0–10.5)

## 2014-03-25 LAB — RETICULOCYTES
RBC.: 4.2 MIL/uL (ref 3.87–5.11)
Retic Count, Absolute: 37.8 10*3/uL (ref 19.0–186.0)
Retic Ct Pct: 0.9 % (ref 0.4–3.1)

## 2014-03-25 LAB — LACTATE DEHYDROGENASE: LDH: 273 U/L — AB (ref 94–250)

## 2014-03-25 NOTE — Progress Notes (Signed)
[facility]  OFFICE PROGRESS NOTE  Robert Bellow, MD [address] [place] [address]  DIAGNOSIS: Breast cancer, right - Plan: CBC with Differential, Reticulocytes, CEA, Cancer antigen 27.29, CBC with Differential, Reticulocytes, CEA, Cancer antigen 27.29, CBC with Differential, Comprehensive metabolic panel, Lactate dehydrogenase, CEA, Cancer antigen 27.29, Beta 2 microglobuline, serum, Thyroglobulin, Thyroglobulin antibody  Hodgkin's disease - Plan: CBC with Differential, Reticulocytes, Lactate dehydrogenase, Beta 2 microglobuline, serum, CBC with Differential, Reticulocytes, Lactate dehydrogenase, Beta 2 microglobuline, serum, CBC with Differential, Comprehensive metabolic panel, Lactate dehydrogenase, CEA, Cancer antigen 27.29, Beta 2 microglobuline, serum, Thyroglobulin, Thyroglobulin antibody  Chief Complaint  Patient presents with  . Breast Cancer  . Lymphoma    CURRENT THERAPY: Watchful expectation.  INTERVAL HISTORY: [name] 80 y.o. female returns for followup of right breast cancer diagnosed in 1987 treated with lumpectomy axillary node dissection with one positive node given stage II disease treated with radiation therapy postoperatively and adjuvant tamoxifen for 5 years by Dr. Laurena Spies and Freddi Che at [facility]. Stage I-A nodular lymphocytic predominant type Hodgkin's disease treated with Stanford 5 regimen completed as of 02/21/1999 resume with oh to 150 mg per meter squared of doxorubicin. She had also undergone near total thyroidectomy for thyroid cancer in the remote past. She continues to remain extremely intact it. Appetite is good with slight lymphedema of the left upper extremity. She denies any fever, night sweats, vaginal discharge or bleeding, hematuria, incontinence, lower extremity swelling or redness, chest pain, abnormalities on self breast examination, sore throat, skin rash, cough, wheezing, bone  pain, headache, or seizures.   MEDICAL HISTORY: Past Medical History  Diagnosis Date  . CHF (congestive heart failure)   . Hodgkin's disease   . Breast cancer 1987    right sided  . DVT (deep venous thrombosis)   . Obesity   . Hypertension   . Thyroid disease     hypothyroidism  . Thyroid carcinoma   . Asthma   . Osteoporosis     INTERIM HISTORY: has CAD (coronary artery disease) and Nonischemic cardiomyopathy on her problem list.   Right breast cancer diagnosed in 1987 treated with lumpectomy axillary node dissection with one positive node given stage II disease treated with radiation therapy postoperatively and adjuvant tamoxifen for 5 years by Dr. Laurena Spies and Freddi Che at [facility]. Stage I-A nodular lymphocytic predominant type Hodgkin's disease treated with Stanford 5 regimen completed as of 02/21/1999 resume with oh to 150 mg per meter squared of doxorubicin. She had also undergone near total thyroidectomy for thyroid cancer in the remote past.  ALLERGIES:  has No Known Allergies.  MEDICATIONS: has a current medication list which includes the following prescription(s): aspirin, calcium carb-cholecalciferol, diphenhydramine-acetaminophen, levothyroxine, lisinopril, multiple vitamins-calcium, simvastatin, spironolactone, vitamin c, and temazepam.  SURGICAL HISTORY:  Past Surgical History  Procedure Laterality Date  . Mastectomy partial / lumpectomy w/ axillary lymphadenectomy  1987    right sided  . Angioplasty / stenting femoral  07/13/2005    left  . Thyroidectomy    . Bunionectomy      right foot  . Abdominal hysterectomy    . Colonoscopy    . Cardiac catheterization    . Appendectomy    . Tonsillectomy    . Cataract extraction w/phaco  05/28/2012    Procedure: CATARACT EXTRACTION PHACO AND INTRAOCULAR LENS PLACEMENT (IOC);  Surgeon: Elta Guadeloupe T. Gershon Crane, MD;  Location: [facility];  Service: Ophthalmology;  Laterality: Right;  CDE 12.86  . Cataract extraction w/phaco  06/04/2012      Procedure: CATARACT EXTRACTION PHACO AND INTRAOCULAR LENS PLACEMENT (IOC);  Surgeon: Elta Guadeloupe T. Gershon Crane, MD;  Location: [facility];  Service: Ophthalmology;  Laterality: Left;  CDE:15.32    FAMILY HISTORY: family history includes Cancer in her mother; Heart attack in her father.  SOCIAL HISTORY:  reports that she has never smoked. She has never used smokeless tobacco. She reports that she does not drink alcohol or use illicit drugs.  REVIEW OF SYSTEMS:  Other than that discussed above is noncontributory.  PHYSICAL EXAMINATION: ECOG PERFORMANCE STATUS: 1 - Symptomatic but completely ambulatory  Blood pressure 114/71, pulse 77, temperature 98.1 F (36.7 C), temperature source Oral, resp. rate 16, weight 209 lb 9.6 oz (95.074 kg).  GENERAL:alert, no distress and comfortable SKIN: skin color, texture, turgor are normal, no rashes or significant lesions EYES: PERLA; Conjunctiva are pink and non-injected, sclera clear SINUSES: No redness or tenderness over maxillary or ethmoid sinuses OROPHARYNX:no exudate, no erythema on lips, buccal mucosa, or tongue. NECK: supple, thyroidectomy scar well healed. No masses CHEST: Status post right lumpectomy with no masses in either breast. LYMPH:  no palpable lymphadenopathy in the cervical, axillary or inguinal LUNGS: clear to auscultation and percussion with normal breathing effort HEART: regular rate & rhythm and no murmurs. ABDOMEN:abdomen soft, non-tender and normal bowel sounds MUSCULOSKELETAL:no cyanosis of digits and no clubbing. Range of motion normal. Minimal left upper extremity lymphedema.  NEURO: alert & oriented x 3 with fluent speech, no focal motor/sensory deficits   LABORATORY DATA: Office Visit on 03/25/2014  Component Date Value Ref Range Status  . WBC 03/25/2014 7.6  4.0 - 10.5 K/uL Final  . RBC 03/25/2014 4.20  3.87 - 5.11 MIL/uL Final  . Hemoglobin 03/25/2014 11.9* 12.0 - 15.0 g/dL Final  . HCT 03/25/2014 36.4  36.0 - 46.0 % Final   . MCV 03/25/2014 86.7  78.0 - 100.0 fL Final  . MCH 03/25/2014 28.3  26.0 - 34.0 pg Final  . MCHC 03/25/2014 32.7  30.0 - 36.0 g/dL Final  . RDW 03/25/2014 15.0  11.5 - 15.5 % Final  . Platelets 03/25/2014 271  150 - 400 K/uL Final  . Neutrophils Relative % 03/25/2014 63  43 - 77 % Final  . Neutro Abs 03/25/2014 4.7  1.7 - 7.7 K/uL Final  . Lymphocytes Relative 03/25/2014 28  12 - 46 % Final  . Lymphs Abs 03/25/2014 2.1  0.7 - 4.0 K/uL Final  . Monocytes Relative 03/25/2014 8  3 - 12 % Final  . Monocytes Absolute 03/25/2014 0.6  0.1 - 1.0 K/uL Final  . Eosinophils Relative 03/25/2014 1  0 - 5 % Final  . Eosinophils Absolute 03/25/2014 0.1  0.0 - 0.7 K/uL Final  . Basophils Relative 03/25/2014 0  0 - 1 % Final  . Basophils Absolute 03/25/2014 0.0  0.0 - 0.1 K/uL Final  . Retic Ct Pct 03/25/2014 0.9  0.4 - 3.1 % Final  . RBC. 03/25/2014 4.20  3.87 - 5.11 MIL/uL Final  . Retic Count, Manual 03/25/2014 37.8  19.0 - 186.0 K/uL Final    PATHOLOGY: No new pathology.  Urinalysis No results found for this basename: colorurine,  appearanceur,  labspec,  phurine,  glucoseu,  hgbur,  bilirubinur,  ketonesur,  proteinur,  urobilinogen,  nitrite,  leukocytesur    RADIOGRAPHIC STUDIES: MM DIGITAL SCREENING BILATERAL Status: Final result  Study Result    CLINICAL DATA: Screening.  EXAM:  DIGITAL SCREENING BILATERAL MAMMOGRAM WITH CAD  COMPARISON: Previous exam(s).  ACR Breast Density Category b: There are scattered areas of  fibroglandular density.  FINDINGS:  There are no findings suspicious for malignancy. Images were  processed with CAD.  IMPRESSION:  No mammographic evidence of malignancy. A result letter of this  screening mammogram will be mailed directly to the patient.  RECOMMENDATION:  Screening mammogram in one year. (Code:SM-B-01Y)  BI-RADS CATEGORY 1: Negative.  Electronically Signed  By: Luberta Robertson M.D.  On: 11/10/2013 14:12     ASSESSMENT:  #1.  Right breast cancer diagnosed in 1987 treated with lumpectomy axillary node dissection with one positive node given stage II disease treated with radiation therapy postoperatively and adjuvant tamoxifen for 5 years by Dr. Laurena Spies and Freddi Che at [facility]. #2. Stage I-A nodular lymphocytic predominant type Hodgkin's disease treated with Stanford 5 regimen completed as of 02/21/1999 resume with oh to 150 mg per meter squared of doxorubicin.  #3. Thyroid cancer, status post near total thyroidectomy, status post ablative radiotherapy, currently on thyroid supplements. #4. History of deep venous thrombosis left axillary vein treated in the past with warfarin, still with minimal left upper extremity lymphedema. #5. Anemia of chronic disease.   PLAN:  #1. Repeat mammogram in February 2016. #2. Call if any new problems occur that are troublesome and persistent. #3. Followup in one year with CBC, chem profile, CEA, CA 27-29, beta 2 microglobulin, LDH, thyroglobulin, and anti-thyroglobulin antibody.   All questions were answered. The patient knows to call the clinic with any problems, questions or concerns. We can certainly see the patient much sooner if necessary.   I spent 25 minutes counseling the patient face to face. The total time spent in the appointment was 30 minutes.    Doroteo Bradford, MD 03/25/2014 3:44 PM  DISCLAIMER:  This note was dictated with voice recognition software.  Similar sounding words can inadvertently be transcribed inaccurately and may not be corrected upon review.

## 2014-03-25 NOTE — Patient Instructions (Signed)
[facility] Discharge Instructions  RECOMMENDATIONS MADE BY THE CONSULTANT AND ANY TEST RESULTS WILL BE SENT TO YOUR REFERRING PHYSICIAN.  EXAM FINDINGS BY THE PHYSICIAN TODAY AND SIGNS OR SYMPTOMS TO REPORT TO CLINIC OR PRIMARY PHYSICIAN: Exam and findings as discussed by Dr. Barnet Glasgow.  You are doing great.  Will check labs today and if there are any issues we will call you.  Report any new lumps, bone pain, shortness of breath, night sweats, unexplained weight loss or other concerns.  MEDICATIONS PRESCRIBED:  none  INSTRUCTIONS/FOLLOW-UP: Follow-up in 1 year with blood work and office visit.  Thank you for choosing [facility] to provide your oncology and hematology care.  To afford each patient quality time with our providers, please arrive at least 15 minutes before your scheduled appointment time.  With your help, our goal is to use those 15 minutes to complete the necessary work-up to ensure our physicians have the information they need to help with your evaluation and healthcare recommendations.    Effective January 1st, 2014, we ask that you re-schedule your appointment with our physicians should you arrive 10 or more minutes late for your appointment.  We strive to give you quality time with our providers, and arriving late affects you and other patients whose appointments are after yours.    Again, thank you for choosing [facility].  Our hope is that these requests will decrease the amount of time that you wait before being seen by our physicians.       _____________________________________________________________  Should you have questions after your visit to [facility], please contact our office at [phone] between the hours of 8:30 a.m. and 4:30 p.m.  Voicemails left after 4:30 p.m. will not be returned until the following business day.  For prescription refill requests, have your pharmacy contact our office with  your prescription refill request.    _______________________________________________________________  We hope that we have given you very good care.  You may receive a patient satisfaction survey in the mail, please complete it and return it as soon as possible.  We value your feedback!  _______________________________________________________________  Have you asked about our STAR program?  STAR stands for Survivorship Training and Rehabilitation, and this is a nationally recognized cancer care program that focuses on survivorship and rehabilitation.  Cancer and cancer treatments may cause problems, such as, pain, making you feel tired and keeping you from doing the things that you need or want to do. Cancer rehabilitation can help. Our goal is to reduce these troubling effects and help you have the best quality of life possible.  You may receive a survey from a nurse that asks questions about your current state of health.  Based on the survey results, all eligible patients will be referred to the [facility] program for an evaluation so we can better serve you!  A frequently asked questions sheet is available upon request.

## 2014-03-25 NOTE — Progress Notes (Signed)
[name] presented for [employer]. Labs per MD order drawn via Peripheral Line 23 gauge needle inserted in left AC  Good blood return present. Procedure without incident.  Needle removed intact. Patient tolerated procedure well.

## 2014-03-26 LAB — CANCER ANTIGEN 27.29: CA 27.29: 20 U/mL (ref 0–39)

## 2014-03-26 LAB — CEA: CEA: 1.8 ng/mL (ref 0.0–5.0)

## 2014-03-30 LAB — BETA 2 MICROGLOBULIN, SERUM: BETA 2 MICROGLOBULIN: 5.05 mg/L — AB (ref <=2.51)

## 2014-08-12 ENCOUNTER — Encounter: Payer: Self-pay | Admitting: Cardiology

## 2014-08-12 ENCOUNTER — Ambulatory Visit (INDEPENDENT_AMBULATORY_CARE_PROVIDER_SITE_OTHER): Payer: Commercial Managed Care - HMO | Admitting: Cardiology

## 2014-08-12 VITALS — BP 138/70 | HR 75 | Ht 69.0 in | Wt 207.0 lb

## 2014-08-12 DIAGNOSIS — I429 Cardiomyopathy, unspecified: Secondary | ICD-10-CM

## 2014-08-12 DIAGNOSIS — I739 Peripheral vascular disease, unspecified: Secondary | ICD-10-CM | POA: Insufficient documentation

## 2014-08-12 DIAGNOSIS — I2510 Atherosclerotic heart disease of native coronary artery without angina pectoris: Secondary | ICD-10-CM

## 2014-08-12 DIAGNOSIS — I10 Essential (primary) hypertension: Secondary | ICD-10-CM | POA: Insufficient documentation

## 2014-08-12 DIAGNOSIS — I428 Other cardiomyopathies: Secondary | ICD-10-CM

## 2014-08-12 NOTE — Assessment & Plan Note (Signed)
No claudication symptoms with history of previous left common iliac stent in 2006. Continue aspirin and statin.

## 2014-08-12 NOTE — Assessment & Plan Note (Signed)
Symptomatically stable without active heart failure symptoms. LVEF was 50-55% by echocardiogram last year. She will continue on lisinopril and Aldactone. No clear reason to put her back on Coreg at this time, although we will plan to reassess LVEF next year.

## 2014-08-12 NOTE — Progress Notes (Signed)
Reason for visit: nonischemic cardiomyopathy, CAD, PAD  Clinical Summary [name] is an 80 y.o.female presenting to establish care in the [facility] office, a former patient of Dr. Rollene Fare, and most recently seen by Dr. Sallyanne Kuster back in May. This is our first meeting. I reviewed her records and updated her medical history below.  She tells me that she has been doing very well in general. She goes to the gym most days of the week and does stretching exercises, aerobic exercises including the treadmill, and also swims in the pool. She reports NYHA class 1-2 dyspnea at baseline and no chest pain or palpitations. We reviewed her medications which are outlined below. She continues on lisinopril and Aldactone with history of cardiomyopathy, has not been on Coreg for quite some time however. She is not certain when this came off of her medication list.  Echocardiogram from June 2014 reported LVEF 50-55% with mild lateral hypokinesis, grade 1 diastolic dysfunction, MAC with mild mitral regurgitation. We discussed this today.  ECG from May of this year showed sinus rhythm with nonspecific T-wave changes.  She does not report any claudication symptoms for several years.   No Known Allergies  Current Outpatient Prescriptions  Medication Sig Dispense Refill  . aspirin 81 MG tablet Take 81 mg by mouth daily.    . Calcium Carbonate-Vitamin D (CALCIUM 600 + D PO) Take 1 tablet by mouth daily.    . diphenhydramine-acetaminophen (TYLENOL PM) 25-500 MG TABS Take 1 tablet by mouth at bedtime as needed.    Marland Kitchen levothyroxine (SYNTHROID, LEVOTHROID) 100 MCG tablet Take 100 mcg by mouth daily.    Marland Kitchen lisinopril (PRINIVIL,ZESTRIL) 10 MG tablet Take 10 mg by mouth daily.    . Multiple Vitamins-Calcium (ONE-A-DAY WOMENS PO) Take 1 tablet by mouth daily.    . simvastatin (ZOCOR) 40 MG tablet Take 1 tablet (40 mg total) by mouth at bedtime. 90 tablet 3  . spironolactone (ALDACTONE) 25 MG tablet Take 12.5 mg by  mouth daily.     . vitamin C (ASCORBIC ACID) 500 MG tablet Take 500 mg by mouth daily.     No current facility-administered medications for this visit.    Past Medical History  Diagnosis Date  . Nonischemic cardiomyopathy     LVEF improved to 50-55% as of 2014  . Hodgkin's disease   . History of breast cancer 1987    Right-sided, mastectomy with axillary node dissection and Adriamycin   . DVT (deep venous thrombosis)   . Obesity   . Essential hypertension   . Hypothyroidism   . History of thyroid cancer   . Asthma   . Osteoporosis   . Peripheral arterial disease     Stent to the left common iliac in 2006  . Coronary atherosclerosis     Cardiac catheterization 2004 with 60% mid LAD    Social History [name] reports that she has never smoked. She has never used smokeless tobacco. [name] reports that she does not drink alcohol.  Review of Systems Complete review of systems negative except as otherwise outlined in the clinical summary and also the following.no palpitations or syncope. Stable appetite. No bleeding episodes. No falls.  Physical Examination Filed Vitals:   08/12/14 0846  BP: 138/70  Pulse: 75   Filed Weights   08/12/14 0846  Weight: 207 lb (93.895 kg)   Patient appears comfortable at rest. HEENT: Conjunctiva and lids normal, oropharynx clear. Neck: Supple, no elevated JVP or carotid bruits, no thyromegaly. Lungs:  Clear to auscultation, nonlabored breathing at rest. Cardiac: Regular rate and rhythm, no S3, soft systolic murmur, no pericardial rub. Abdomen: Soft, nontender, bowel sounds present, no guarding or rebound. Extremities: No pitting edema, distal pulses 1-2+. Skin: Warm and dry. Musculoskeletal: No kyphosis. Neuropsychiatric: Alert and oriented x3, affect grossly appropriate.   Problem List and Plan   Nonischemic cardiomyopathy Symptomatically stable without active heart failure symptoms. LVEF was 50-55% by echocardiogram last year.  She will continue on lisinopril and Aldactone. No clear reason to put her back on Coreg at this time, although we will plan to reassess LVEF next year.  CAD (coronary artery disease), native coronary artery She has a history of nonobstructive disease as outlined above documented several years ago. She is not reporting any angina symptoms at this time and maintaining an active exercise regimen. Continue aspirin and statin therapy.  Essential hypertension No change in current antihypertensive regimen.  Peripheral arterial disease No claudication symptoms with history of previous left common iliac stent in 2006. Continue aspirin and statin.    Satira Sark, M.D., F.A.C.C.

## 2014-08-12 NOTE — Patient Instructions (Signed)
Your physician wants you to follow-up in: 6 months with Dr. McDowell You will receive a reminder letter in the mail two months in advance. If you don't receive a letter, please call our office to schedule the follow-up appointment.  Your physician recommends that you continue on your current medications as directed. Please refer to the Current Medication list given to you today.  Thank you for choosing [facility]!!    

## 2014-08-12 NOTE — Assessment & Plan Note (Signed)
She has a history of nonobstructive disease as outlined above documented several years ago. She is not reporting any angina symptoms at this time and maintaining an active exercise regimen. Continue aspirin and statin therapy.

## 2014-08-12 NOTE — Assessment & Plan Note (Signed)
No change in current antihypertensive regimen.

## 2014-10-06 ENCOUNTER — Other Ambulatory Visit (HOSPITAL_COMMUNITY): Payer: Self-pay | Admitting: Family Medicine

## 2014-10-06 DIAGNOSIS — Z1231 Encounter for screening mammogram for malignant neoplasm of breast: Secondary | ICD-10-CM

## 2014-11-09 ENCOUNTER — Ambulatory Visit (HOSPITAL_COMMUNITY): Payer: Commercial Managed Care - HMO

## 2014-11-12 ENCOUNTER — Ambulatory Visit (HOSPITAL_COMMUNITY)
Admission: RE | Admit: 2014-11-12 | Discharge: 2014-11-12 | Disposition: A | Discharge status: Home / Self Care | Payer: Commercial Managed Care - HMO | Source: Ambulatory Visit | Attending: Family Medicine | Admitting: Family Medicine

## 2014-11-12 DIAGNOSIS — Z1231 Encounter for screening mammogram for malignant neoplasm of breast: Secondary | ICD-10-CM | POA: Diagnosis present

## 2015-02-10 ENCOUNTER — Ambulatory Visit (INDEPENDENT_AMBULATORY_CARE_PROVIDER_SITE_OTHER): Payer: Commercial Managed Care - HMO | Admitting: Cardiology

## 2015-02-10 ENCOUNTER — Encounter: Payer: Self-pay | Admitting: Cardiology

## 2015-02-10 VITALS — BP 118/84 | HR 98 | Ht 68.0 in | Wt 212.0 lb

## 2015-02-10 DIAGNOSIS — I10 Essential (primary) hypertension: Secondary | ICD-10-CM

## 2015-02-10 DIAGNOSIS — I429 Cardiomyopathy, unspecified: Secondary | ICD-10-CM | POA: Diagnosis not present

## 2015-02-10 DIAGNOSIS — I2510 Atherosclerotic heart disease of native coronary artery without angina pectoris: Secondary | ICD-10-CM

## 2015-02-10 DIAGNOSIS — I428 Other cardiomyopathies: Secondary | ICD-10-CM

## 2015-02-10 DIAGNOSIS — E782 Mixed hyperlipidemia: Secondary | ICD-10-CM

## 2015-02-10 NOTE — Progress Notes (Signed)
Cardiology Office Note  Date: 02/10/2015   ID: [name], DOB [birth year], MRN [id number]  PCP: Robert Bellow, MD  Primary Cardiologist: Rozann Lesches, MD   Chief Complaint  Patient presents with  . History of cardiomyopathy  . Coronary Artery Disease    History of Present Illness: [name] is an 81 y.o. female last seen in November 2015. She presents for a routine follow-up visit. She tells me that she stays very active, still exercises at the [facility] almost everyday of the week, works out in her yard. She reports NYHA class 1-2 dyspnea and no chest pain.  We discussed her medications, no changes from a cardiac perspective.  She indicates having had lab work with Dr. Karie Kirks since I last saw her. Follow-up ECG is reviewed below.   Past Medical History  Diagnosis Date  . Nonischemic cardiomyopathy     LVEF improved to 50-55% as of 2014  . Hodgkin's disease   . History of breast cancer 1987    Right-sided, mastectomy with axillary node dissection and Adriamycin   . DVT (deep venous thrombosis)   . Obesity   . Essential hypertension   . Hypothyroidism   . History of thyroid cancer   . Asthma   . Osteoporosis   . Peripheral arterial disease     Stent to the left common iliac in 2006  . Coronary atherosclerosis     Cardiac catheterization 2004 with 60% mid LAD     Current Outpatient Prescriptions  Medication Sig Dispense Refill  . aspirin 81 MG tablet Take 81 mg by mouth daily.    . Calcium Carbonate-Vitamin D (CALCIUM 600 + D PO) Take 1 tablet by mouth daily.    . diphenhydramine-acetaminophen (TYLENOL PM) 25-500 MG TABS Take 1 tablet by mouth at bedtime as needed.    Marland Kitchen levothyroxine (SYNTHROID, LEVOTHROID) 100 MCG tablet Take 100 mcg by mouth daily.    Marland Kitchen lisinopril (PRINIVIL,ZESTRIL) 10 MG tablet Take 10 mg by mouth daily.    . Multiple Vitamins-Calcium (ONE-A-DAY WOMENS PO) Take 1 tablet by mouth daily.    . simvastatin (ZOCOR) 40 MG  tablet Take 1 tablet (40 mg total) by mouth at bedtime. 90 tablet 3  . spironolactone (ALDACTONE) 25 MG tablet Take 12.5 mg by mouth daily.     . temazepam (RESTORIL) 30 MG capsule Take 30 mg by mouth at bedtime.     . vitamin C (ASCORBIC ACID) 500 MG tablet Take 500 mg by mouth daily.     No current facility-administered medications for this visit.    Allergies:  Review of patient's allergies indicates no known allergies.   Social History: The patient  reports that she has never smoked. She has never used smokeless tobacco. She reports that she does not drink alcohol or use illicit drugs.   ROS:  Please see the history of present illness. Otherwise, complete review of systems is positive for none.  All other systems are reviewed and negative.   Physical Exam: VS:  BP 118/84 mmHg  Pulse 98  Ht 5\' 8"  (1.727 m)  Wt 212 lb (96.163 kg)  BMI 32.24 kg/m2  SpO2 96%, BMI Body mass index is 32.24 kg/(m^2).  Wt Readings from Last 3 Encounters:  02/10/15 212 lb (96.163 kg)  08/12/14 207 lb (93.895 kg)  03/25/14 209 lb 9.6 oz (95.074 kg)     Patient appears comfortable at rest. HEENT: Conjunctiva and lids normal, oropharynx clear. Neck: Supple, no elevated JVP or  carotid bruits, no thyromegaly. Lungs: Clear to auscultation, nonlabored breathing at rest. Cardiac: Regular rate and rhythm, no S3, soft systolic murmur, no pericardial rub. Abdomen: Soft, nontender, bowel sounds present, no guarding or rebound. Extremities: No pitting edema, distal pulses 1-2+. Skin: Warm and dry. Musculoskeletal: No kyphosis. Neuropsychiatric: Alert and oriented x3, affect grossly appropriate.   ECG: ECG is ordered today and reviewed showing sinus rhythm with nonspecific T-wave changes and leftward axis.  Recent Labwork: 03/25/2014: Hemoglobin 11.9*; Platelets 271     Component Value Date/Time   CHOL 176 02/28/2013 1013   TRIG 49 02/28/2013 1013   HDL 70 02/28/2013 1013   CHOLHDL 2.5 02/28/2013 1013    VLDL 10 02/28/2013 1013   LDLCALC 96 02/28/2013 1013    Other Studies Reviewed Today:  Echocardiogram from June 2014 reported LVEF 50-55% with mild lateral hypokinesis, grade 1 diastolic dysfunction, MAC with mild mitral regurgitation.  ASSESSMENT AND PLAN:  1. History of cardiomyopathy with improvement in LVEF on medical therapy as outlined above. She remains symptomatically quite stable with NYHA class 1-2 shortness of breath. We will continue observation, I have encouraged her to maintain a regular exercise plan.  2. History of moderate CAD by prior assessment, no active angina symptoms. Continues on aspirin, statin, and ACE inhibitor.  3. Hyperlipidemia, on Zocor. We will obtain most recent lab work from Dr. Karie Kirks.  Current medicines were reviewed at length with the patient today.   Orders Placed This Encounter  Procedures  . EKG 12-Lead    Disposition: FU with me in 6 months.   Signed, Satira Sark, MD, [facility] 02/10/2015 9:31 AM    [facility]. [address], [place], [place] [address] Phone: [phone]; Fax: [phone]

## 2015-02-10 NOTE — Patient Instructions (Signed)
Your physician wants you to follow-up in: 6 months with Dr.McDowell You will receive a reminder letter in the mail two months in advance. If you don't receive a letter, please call our office to schedule the follow-up appointment.     Your physician recommends that you continue on your current medications as directed. Please refer to the Current Medication list given to you today.     Thank you for choosing [facility] !        

## 2015-03-12 ENCOUNTER — Other Ambulatory Visit (HOSPITAL_COMMUNITY): Payer: Self-pay

## 2015-03-12 DIAGNOSIS — C8190 Hodgkin lymphoma, unspecified, unspecified site: Secondary | ICD-10-CM

## 2015-03-12 DIAGNOSIS — C50919 Malignant neoplasm of unspecified site of unspecified female breast: Secondary | ICD-10-CM

## 2015-03-26 ENCOUNTER — Encounter (HOSPITAL_BASED_OUTPATIENT_CLINIC_OR_DEPARTMENT_OTHER): Payer: Commercial Managed Care - HMO | Admitting: Hematology & Oncology

## 2015-03-26 ENCOUNTER — Encounter (HOSPITAL_COMMUNITY): Payer: Commercial Managed Care - HMO | Attending: Hematology & Oncology

## 2015-03-26 ENCOUNTER — Ambulatory Visit (HOSPITAL_COMMUNITY): Payer: Commercial Managed Care - HMO | Admitting: Hematology & Oncology

## 2015-03-26 ENCOUNTER — Encounter (HOSPITAL_COMMUNITY): Payer: Self-pay | Admitting: Hematology & Oncology

## 2015-03-26 VITALS — BP 127/63 | HR 79 | Temp 97.7°F | Resp 16 | Wt 210.4 lb

## 2015-03-26 DIAGNOSIS — E669 Obesity, unspecified: Secondary | ICD-10-CM | POA: Diagnosis not present

## 2015-03-26 DIAGNOSIS — Z853 Personal history of malignant neoplasm of breast: Secondary | ICD-10-CM

## 2015-03-26 DIAGNOSIS — D649 Anemia, unspecified: Secondary | ICD-10-CM | POA: Diagnosis not present

## 2015-03-26 DIAGNOSIS — E039 Hypothyroidism, unspecified: Secondary | ICD-10-CM | POA: Insufficient documentation

## 2015-03-26 DIAGNOSIS — I739 Peripheral vascular disease, unspecified: Secondary | ICD-10-CM | POA: Insufficient documentation

## 2015-03-26 DIAGNOSIS — Z8572 Personal history of non-Hodgkin lymphomas: Secondary | ICD-10-CM | POA: Diagnosis not present

## 2015-03-26 DIAGNOSIS — Z79899 Other long term (current) drug therapy: Secondary | ICD-10-CM | POA: Diagnosis not present

## 2015-03-26 DIAGNOSIS — C50911 Malignant neoplasm of unspecified site of right female breast: Secondary | ICD-10-CM

## 2015-03-26 DIAGNOSIS — C8190 Hodgkin lymphoma, unspecified, unspecified site: Secondary | ICD-10-CM | POA: Diagnosis present

## 2015-03-26 DIAGNOSIS — Z85850 Personal history of malignant neoplasm of thyroid: Secondary | ICD-10-CM

## 2015-03-26 DIAGNOSIS — C50919 Malignant neoplasm of unspecified site of unspecified female breast: Secondary | ICD-10-CM

## 2015-03-26 DIAGNOSIS — Z8571 Personal history of Hodgkin lymphoma: Secondary | ICD-10-CM

## 2015-03-26 DIAGNOSIS — N189 Chronic kidney disease, unspecified: Secondary | ICD-10-CM | POA: Diagnosis not present

## 2015-03-26 DIAGNOSIS — I2510 Atherosclerotic heart disease of native coronary artery without angina pectoris: Secondary | ICD-10-CM | POA: Diagnosis not present

## 2015-03-26 DIAGNOSIS — J45909 Unspecified asthma, uncomplicated: Secondary | ICD-10-CM | POA: Insufficient documentation

## 2015-03-26 DIAGNOSIS — I129 Hypertensive chronic kidney disease with stage 1 through stage 4 chronic kidney disease, or unspecified chronic kidney disease: Secondary | ICD-10-CM | POA: Insufficient documentation

## 2015-03-26 DIAGNOSIS — C73 Malignant neoplasm of thyroid gland: Secondary | ICD-10-CM

## 2015-03-26 LAB — CBC WITH DIFFERENTIAL/PLATELET
BASOS ABS: 0 10*3/uL (ref 0.0–0.1)
Basophils Relative: 1 % (ref 0–1)
EOS ABS: 0.1 10*3/uL (ref 0.0–0.7)
Eosinophils Relative: 2 % (ref 0–5)
HEMATOCRIT: 37.8 % (ref 36.0–46.0)
HEMOGLOBIN: 12 g/dL (ref 12.0–15.0)
LYMPHS ABS: 2.1 10*3/uL (ref 0.7–4.0)
Lymphocytes Relative: 33 % (ref 12–46)
MCH: 27.9 pg (ref 26.0–34.0)
MCHC: 31.7 g/dL (ref 30.0–36.0)
MCV: 87.9 fL (ref 78.0–100.0)
MONO ABS: 0.5 10*3/uL (ref 0.1–1.0)
Monocytes Relative: 8 % (ref 3–12)
NEUTROS ABS: 3.7 10*3/uL (ref 1.7–7.7)
Neutrophils Relative %: 56 % (ref 43–77)
PLATELETS: 267 10*3/uL (ref 150–400)
RBC: 4.3 MIL/uL (ref 3.87–5.11)
RDW: 15.8 % — ABNORMAL HIGH (ref 11.5–15.5)
WBC: 6.5 10*3/uL (ref 4.0–10.5)

## 2015-03-26 LAB — COMPREHENSIVE METABOLIC PANEL
ALT: 21 U/L (ref 14–54)
AST: 25 U/L (ref 15–41)
Albumin: 3.9 g/dL (ref 3.5–5.0)
Alkaline Phosphatase: 61 U/L (ref 38–126)
Anion gap: 9 (ref 5–15)
BUN: 34 mg/dL — AB (ref 6–20)
CO2: 25 mmol/L (ref 22–32)
Calcium: 7.7 mg/dL — ABNORMAL LOW (ref 8.9–10.3)
Chloride: 104 mmol/L (ref 101–111)
Creatinine, Ser: 1.42 mg/dL — ABNORMAL HIGH (ref 0.44–1.00)
GFR calc Af Amer: 39 mL/min — ABNORMAL LOW (ref >60)
GFR calc non Af Amer: 33 mL/min — ABNORMAL LOW (ref >60)
GLUCOSE: 113 mg/dL — AB (ref 65–99)
POTASSIUM: 4.2 mmol/L (ref 3.5–5.1)
Sodium: 138 mmol/L (ref 135–145)
TOTAL PROTEIN: 7.6 g/dL (ref 6.5–8.1)
Total Bilirubin: 0.6 mg/dL (ref 0.3–1.2)

## 2015-03-26 LAB — LACTATE DEHYDROGENASE: LDH: 226 U/L — ABNORMAL HIGH (ref 98–192)

## 2015-03-26 NOTE — Progress Notes (Signed)
Labs drawn

## 2015-03-26 NOTE — Progress Notes (Signed)
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 female is here because of a history of breast cancer, Hodgkin disease, thyroid carcinoma.  She is feeling fine today and is here alone.  She recently lost her check book and found that her account was low. She has had to close her bank account and is leaving after her visit today to go to the bank. Her outlook is still very positive, saying that she has been baking lots of cakes earlier today. She remains active.  She has noticed some spots on her right arm and right leg. The spot on her leg has been there over the past year. The spot on her arm she only recently noticed. She has been putting wart remover on it and she believes it has helped to dry them out. Her primary care doctor suggested that she see a dermatologist. She says she doesn't have enough money to see another doctor.  Her appetite has been well. Her sleeping is not regular but she explains that she used to work swing shift and attributes her sleeping issues to that. She denies change in appetite or energy level. No fever, no nausea or change in bowel habits. No night sweats or hot flashes.  MEDICAL HISTORY:  Past Medical History  Diagnosis Date  . Nonischemic cardiomyopathy     LVEF improved to 50-55% as of 2014  . Hodgkin's disease   . History of breast cancer 1987    Right-sided, mastectomy with  axillary node dissection and Adriamycin   . DVT (deep venous thrombosis)   . Obesity   . Essential hypertension   . Hypothyroidism   . History of thyroid cancer   . Asthma   . Osteoporosis   . Peripheral arterial disease     Stent to the left common iliac in 2006  . Coronary atherosclerosis     Cardiac catheterization 2004 with 60% mid LAD    SURGICAL HISTORY: Past Surgical History  Procedure Laterality Date  . Mastectomy partial / lumpectomy w/ axillary lymphadenectomy Right 1987  . Angioplasty / stenting femoral Left 07/13/2005  . Thyroidectomy    . Bunionectomy Right   . Abdominal hysterectomy    . Colonoscopy    . Appendectomy    . Tonsillectomy    . Cataract extraction w/phaco  05/28/2012    Procedure: CATARACT EXTRACTION PHACO AND INTRAOCULAR LENS PLACEMENT (IOC);  Surgeon: Elta Guadeloupe T. Gershon Crane, MD;  Location: [facility];  Service: Ophthalmology;  Laterality: Right;  CDE 12.86  . Cataract extraction w/phaco  06/04/2012    Procedure: CATARACT EXTRACTION PHACO AND INTRAOCULAR LENS PLACEMENT (IOC);  Surgeon: Elta Guadeloupe T. Gershon Crane, MD;  Location: [facility];  Service: Ophthalmology;  Laterality: Left;  CDE:15.32    SOCIAL HISTORY: History   Social History  . Marital Status: Married    Spouse Name: N/A  . Number of Children: N/A  . Years of Education: N/A   Occupational History  . Not on file.   Social History Main  Topics  . Smoking status: Never Smoker   . Smokeless tobacco: Never Used  . Alcohol Use: No  . Drug Use: No  . Sexual Activity: No   Other Topics Concern  . Not on file   Social History Narrative    FAMILY HISTORY: Family History  Problem Relation Age of Onset  . Lung cancer Mother   . Heart attack Father    indicated that her mother is deceased. She indicated that her father is deceased. She indicated that her daughter is deceased.   ALLERGIES:  has No Known Allergies.  MEDICATIONS:  Current Outpatient Prescriptions  Medication Sig Dispense Refill  .  aspirin 81 MG tablet Take 81 mg by mouth daily.    . Calcium Carbonate-Vitamin D (CALCIUM 600 + D PO) Take 1 tablet by mouth daily.    . diphenhydramine-acetaminophen (TYLENOL PM) 25-500 MG TABS Take 1 tablet by mouth at bedtime as needed.    Marland Kitchen levothyroxine (SYNTHROID, LEVOTHROID) 100 MCG tablet Take 100 mcg by mouth daily.    Marland Kitchen lisinopril (PRINIVIL,ZESTRIL) 10 MG tablet Take 10 mg by mouth daily.    . Multiple Vitamins-Calcium (ONE-A-DAY WOMENS PO) Take 1 tablet by mouth daily.    . simvastatin (ZOCOR) 40 MG tablet Take 1 tablet (40 mg total) by mouth at bedtime. 90 tablet 3  . spironolactone (ALDACTONE) 25 MG tablet Take 12.5 mg by mouth daily.     . temazepam (RESTORIL) 30 MG capsule Take 30 mg by mouth at bedtime.     . vitamin C (ASCORBIC ACID) 500 MG tablet Take 500 mg by mouth daily.     No current facility-administered medications for this visit.    Review of Systems  Skin:       Skin changes on her right arm and right leg.  All other systems reviewed and are negative.  14 point ROS was done and is otherwise as detailed above or in HPI   PHYSICAL EXAMINATION: ECOG PERFORMANCE STATUS: 0 - Asymptomatic  Filed Vitals:   03/26/15 1302  BP: 127/63  Pulse: 79  Temp: 97.7 F (36.5 C)  Resp: 16   Filed Weights   03/26/15 1302  Weight: 210 lb 6.4 oz (95.437 kg)     Physical Exam  Constitutional: She is oriented to person, place, and time and well-developed, well-nourished, and in no distress.  HENT:  Head: Normocephalic and atraumatic.  Nose: Nose normal.  Mouth/Throat: Oropharynx is clear and moist. No oropharyngeal exudate.  Eyes: Conjunctivae and EOM are normal. Pupils are equal, round, and reactive to light. Right eye exhibits no discharge. Left eye exhibits no discharge. No scleral icterus.  Neck: Normal range of motion. Neck supple. No tracheal deviation present. No thyromegaly present.  Cardiovascular: Normal rate, regular rhythm and normal heart sounds.  Exam  reveals no gallop and no friction rub.   No murmur heard. Pulmonary/Chest: Effort normal and breath sounds normal. She has no wheezes. She has no rales.    Abdominal: Soft. Bowel sounds are normal. She exhibits no distension and no mass. There is no tenderness. There is no rebound and no guarding.  Musculoskeletal: Normal range of motion. She exhibits no edema.  Lymphadenopathy:    She has no cervical adenopathy.  Neurological: She is alert and oriented to person, place, and time. She has normal reflexes. No cranial nerve deficit. Gait normal. Coordination normal.  Skin: Skin is warm and dry. No rash noted.  Psychiatric: Mood, memory, affect and judgment normal.  Nursing note  and vitals reviewed.     LABORATORY DATA:  I have reviewed the data as listed Lab Results  Component Value Date   WBC 6.5 03/26/2015   HGB 12.0 03/26/2015   HCT 37.8 03/26/2015   MCV 87.9 03/26/2015   PLT 267 03/26/2015   CMP     Component Value Date/Time   NA 138 03/26/2015 1301   K 4.2 03/26/2015 1301   CL 104 03/26/2015 1301   CO2 25 03/26/2015 1301   GLUCOSE 113* 03/26/2015 1301   BUN 34* 03/26/2015 1301   CREATININE 1.42* 03/26/2015 1301   CREATININE 1.62* 02/28/2013 1013   CALCIUM 7.7* 03/26/2015 1301   PROT 7.6 03/26/2015 1301   ALBUMIN 3.9 03/26/2015 1301   AST 25 03/26/2015 1301   ALT 21 03/26/2015 1301   ALKPHOS 61 03/26/2015 1301   BILITOT 0.6 03/26/2015 1301   GFRNONAA 33* 03/26/2015 1301   GFRAA 39* 03/26/2015 1301     RADIOGRAPHIC STUDIES: I have personally reviewed the radiological images as listed and agreed with the findings in the report. MM DIGITAL SCREENING BILATERAL   Status: Final result       Study Result     CLINICAL DATA: Screening. History of right lumpectomy for breast cancer 1987.  EXAM: DIGITAL SCREENING BILATERAL MAMMOGRAM WITH CAD  COMPARISON: Previous exam(s).  ACR Breast Density Category b: There are scattered areas of fibroglandular  density.  FINDINGS: There are no findings suspicious for malignancy. Images were processed with CAD. Right lumpectomy changes are noted.  IMPRESSION: No mammographic evidence of malignancy. A result letter of this screening mammogram will be mailed directly to the patient.  RECOMMENDATION: Screening mammogram in one year. (Code:SM-B-01Y)  BI-RADS CATEGORY 2: Benign.   Electronically Signed  By: Conchita Paris M.D.  On: 11/13/2014 14:10       ASSESSMENT & PLAN:  History of Stage II carcinoma of R breast, ER+ History of Hodgkin Lymphoma History of Thyroid Carcinoma CKD  She seems to be doing well without evidence of any recurrent disease.  Dr. Karie Kirks orders her mammograms. Breast exam is benign.  Labs are unremarkable except for CKD which appears to be stable.  She would like to continue with yearly visits and given her complex cancer history this is very reasonable.  I advised her to follow Dr. Burnard Hawthorne advice in regards to a dermatology referral. She is open to see Dr. [place] Crane. We will refer her today.  All questions were answered. The patient knows to call the clinic with any problems, questions or concerns.  This note was electronically signed.   This document serves as a record of services personally performed by Ancil Linsey, MD. It was created on her behalf by Arlyce Harman, a trained medical scribe. The creation of this record is based on the scribe's personal observations and the provider's statements to them. This document has been checked and approved by the attending provider.  I have reviewed the above documentation for accuracy and completeness, and I agree with the above. Molli Hazard, MD

## 2015-03-26 NOTE — Patient Instructions (Signed)
[place] at [facility] Discharge Instructions  RECOMMENDATIONS MADE BY THE CONSULTANT AND ANY TEST RESULTS WILL BE SENT TO YOUR REFERRING PHYSICIAN.  Exam and discussion by Dr. Whitney Muse. Will make referral to Dematology to evaluated the skin lesions on your right leg and right arm. Report any new lumps, bone pain, shortness of breath or other symptoms.  Follow-up in 1 year.  Thank you for choosing [place] at [facility] to provide your oncology and hematology care.  To afford each patient quality time with our provider, please arrive at least 15 minutes before your scheduled appointment time.    You need to re-schedule your appointment should you arrive 10 or more minutes late.  We strive to give you quality time with our providers, and arriving late affects you and other patients whose appointments are after yours.  Also, if you no show three or more times for appointments you may be dismissed from the clinic at the providers discretion.     Again, thank you for choosing [facility].  Our hope is that these requests will decrease the amount of time that you wait before being seen by our physicians.       _____________________________________________________________  Should you have questions after your visit to [facility], please contact our office at [phone] between the hours of 8:30 a.m. and 4:30 p.m.  Voicemails left after 4:30 p.m. will not be returned until the following business day.  For prescription refill requests, have your pharmacy contact our office.

## 2015-03-27 LAB — CANCER ANTIGEN 27.29: CA 27.29: 21.4 U/mL (ref 0.0–38.6)

## 2015-03-27 LAB — BETA 2 MICROGLOBULIN, SERUM: Beta-2 Microglobulin: 3.3 mg/L — ABNORMAL HIGH (ref 0.6–2.4)

## 2015-03-30 LAB — THYROGLOBULIN LEVEL

## 2015-03-30 LAB — THYROGLOBULIN ANTIBODY: Thyroglobulin Antibody: <1 IU/mL (ref 0.0–0.9)

## 2015-04-05 ENCOUNTER — Telehealth (HOSPITAL_COMMUNITY): Payer: Self-pay | Admitting: Hematology & Oncology

## 2015-04-05 NOTE — Telephone Encounter (Signed)
SILVERBACK REF FOR DOS 04/07/15-10/04/15 AUTH# [id number]

## 2015-04-11 ENCOUNTER — Encounter (HOSPITAL_COMMUNITY): Payer: Self-pay | Admitting: Hematology & Oncology

## 2015-08-10 ENCOUNTER — Telehealth: Payer: Self-pay | Admitting: Internal Medicine

## 2015-08-10 NOTE — Telephone Encounter (Signed)
RECALL FOR TCS °

## 2015-08-10 NOTE — Telephone Encounter (Signed)
Letter mailed to pt.  

## 2015-08-12 ENCOUNTER — Encounter: Payer: Self-pay | Admitting: Cardiology

## 2015-08-12 ENCOUNTER — Ambulatory Visit (INDEPENDENT_AMBULATORY_CARE_PROVIDER_SITE_OTHER): Payer: Commercial Managed Care - HMO | Admitting: Cardiology

## 2015-08-12 VITALS — BP 130/62 | HR 60 | Ht 69.0 in | Wt 207.0 lb

## 2015-08-12 DIAGNOSIS — I428 Other cardiomyopathies: Secondary | ICD-10-CM

## 2015-08-12 DIAGNOSIS — I429 Cardiomyopathy, unspecified: Secondary | ICD-10-CM

## 2015-08-12 DIAGNOSIS — I2510 Atherosclerotic heart disease of native coronary artery without angina pectoris: Secondary | ICD-10-CM

## 2015-08-12 NOTE — Progress Notes (Signed)
Cardiology Office Note  Date: 08/12/2015   ID: [name], DOB 1930-02-03, MRN [id number]  PCP: Robert Bellow, MD  Primary Cardiologist: Rozann Lesches, MD   Chief Complaint  Patient presents with  . Cardiomyopathy    History of Present Illness: [name] is an 81 y.o. female last seen in May. She presents for a routine follow-up visit. She has continued to do very well, exercises 5 days a week at the [facility], usually part of this workout includes 3 miles on the treadmill, also exercises in the pool. She and her 82 year old husband of 28 years still live in their own home. She states that he is slowly declining.  We reviewed her medications which are outlined below. She reports compliance, no significant intolerances.  At baseline she has NYHA class II dyspnea, does not report any exertional chest pain, palpitations, or syncope.    Past Medical History  Diagnosis Date  . Nonischemic cardiomyopathy (HCC)     LVEF improved to 50-55% as of 2014  . Hodgkin's disease ([place])   . History of breast cancer 1987    Right-sided, mastectomy with axillary node dissection and Adriamycin   . DVT (deep venous thrombosis) ([place])   . Obesity   . Essential hypertension   . Hypothyroidism   . History of thyroid cancer   . Asthma   . Osteoporosis   . Peripheral arterial disease ([place])     Stent to the left common iliac in 2006  . Coronary atherosclerosis     Cardiac catheterization 2004 with 60% mid LAD    Current Outpatient Prescriptions  Medication Sig Dispense Refill  . aspirin 81 MG tablet Take 81 mg by mouth daily.    . Calcium Carbonate-Vitamin D (CALCIUM 600 + D PO) Take 1 tablet by mouth daily.    . diphenhydramine-acetaminophen (TYLENOL PM) 25-500 MG TABS Take 1 tablet by mouth at bedtime as needed.    Marland Kitchen levothyroxine (SYNTHROID, LEVOTHROID) 100 MCG tablet Take 100 mcg by mouth daily.    Marland Kitchen lisinopril (PRINIVIL,ZESTRIL) 10 MG tablet Take 10 mg by mouth daily.     . Multiple Vitamins-Calcium (ONE-A-DAY WOMENS PO) Take 1 tablet by mouth daily.    . simvastatin (ZOCOR) 40 MG tablet Take 1 tablet (40 mg total) by mouth at bedtime. 90 tablet 3  . spironolactone (ALDACTONE) 25 MG tablet Take 12.5 mg by mouth daily.     . temazepam (RESTORIL) 30 MG capsule Take 30 mg by mouth at bedtime.     . vitamin C (ASCORBIC ACID) 500 MG tablet Take 500 mg by mouth daily.     No current facility-administered medications for this visit.    Allergies:  Review of patient's allergies indicates no known allergies.   Social History: The patient  reports that she has never smoked. She has never used smokeless tobacco. She reports that she does not drink alcohol or use illicit drugs.   ROS:  Please see the history of present illness. Otherwise, complete review of systems is positive for arthritic pains and stiffness.  All other systems are reviewed and negative.   Physical Exam: VS:  BP 130/62 mmHg  Pulse 60  Ht 5\' 9"  (1.753 m)  Wt 207 lb (93.895 kg)  BMI 30.55 kg/m2  SpO2 97%, BMI Body mass index is 30.55 kg/(m^2).  Wt Readings from Last 3 Encounters:  08/12/15 207 lb (93.895 kg)  03/26/15 210 lb 6.4 oz (95.437 kg)  02/10/15 212 lb (96.163 kg)  Patient appears comfortable at rest. HEENT: Conjunctiva and lids normal, oropharynx clear. Neck: Supple, no elevated JVP or carotid bruits, no thyromegaly. Lungs: Clear to auscultation, nonlabored breathing at rest. Cardiac: Regular rate and rhythm, no S3, soft systolic murmur, no pericardial rub. Abdomen: Soft, nontender, bowel sounds present, no guarding or rebound. Extremities: No pitting edema, distal pulses 1-2+.   ECG: Tracing from 02/15/2015 showed sinus rhythm with nonspecific T-wave changes.  Recent Labwork: 03/26/2015: ALT 21; AST 25; BUN 34*; Creatinine, Ser 1.42*; Hemoglobin 12.0; Platelets 267; Potassium 4.2; Sodium 138  March 2016: Cholesterol 194, HDL 98, triglycerides 66, LDL 83  Other Studies  Reviewed Today:  Echocardiogram from June 2014 reported LVEF 50-55% with mild lateral hypokinesis, grade 1 diastolic dysfunction, MAC with mild mitral regurgitation.  ASSESSMENT AND PLAN:  1. History of moderate CAD based on prior evaluation. She has not had any recent ischemic testing, but remains asymptomatic with very good functional capacity, exercising 5 days a week as discussed above. Plan is to continue medical therapy and observation. She is on aspirin and statin.  2. Nonischemic cardiomyopathy with LVEF 50-55% within the last 2 years by echocardiogram. She does not have any active heart failure symptoms.  Current medicines were reviewed at length with the patient today.  Disposition: FU with me in 6 months.   Signed, Satira Sark, MD, [facility] 08/12/2015 9:12 AM    [facility] at [facility] [address], [place], [place] [address] Phone: [phone]; Fax: [phone]

## 2015-08-12 NOTE — Patient Instructions (Signed)
Medication Instructions:  Your physician recommends that you continue on your current medications as directed. Please refer to the Current Medication list given to you today.   Labwork: NONE  Testing/Procedures: NONE  Follow-Up: Your physician wants you to follow-up in: 6 MONTHS WITH DR. MCDOWELL. You will receive a reminder letter in the mail two months in advance. If you don't receive a letter, please call our office to schedule the follow-up appointment.   Any Other Special Instructions Will Be Listed Below (If Applicable).       If you need a refill on your cardiac medications before your next appointment, please call your pharmacy.  Thanks for choosing [facility]!!!     

## 2015-10-08 ENCOUNTER — Other Ambulatory Visit (HOSPITAL_COMMUNITY): Payer: Self-pay | Admitting: Family Medicine

## 2015-10-08 DIAGNOSIS — Z1231 Encounter for screening mammogram for malignant neoplasm of breast: Secondary | ICD-10-CM

## 2015-11-18 ENCOUNTER — Other Ambulatory Visit (HOSPITAL_COMMUNITY): Payer: Self-pay | Admitting: Family Medicine

## 2015-11-18 ENCOUNTER — Ambulatory Visit (HOSPITAL_COMMUNITY): Payer: Commercial Managed Care - HMO

## 2015-11-18 ENCOUNTER — Ambulatory Visit (HOSPITAL_COMMUNITY)
Admission: RE | Admit: 2015-11-18 | Discharge: 2015-11-18 | Disposition: A | Discharge status: Home / Self Care | Payer: Medicare HMO | Source: Ambulatory Visit | Attending: Family Medicine | Admitting: Family Medicine

## 2015-11-18 DIAGNOSIS — Z1231 Encounter for screening mammogram for malignant neoplasm of breast: Secondary | ICD-10-CM | POA: Insufficient documentation

## 2016-02-23 ENCOUNTER — Ambulatory Visit: Payer: Medicare HMO | Admitting: Cardiology

## 2016-02-24 ENCOUNTER — Ambulatory Visit (INDEPENDENT_AMBULATORY_CARE_PROVIDER_SITE_OTHER): Payer: Medicare HMO | Admitting: Cardiology

## 2016-02-24 ENCOUNTER — Encounter: Payer: Self-pay | Admitting: Cardiology

## 2016-02-24 VITALS — BP 160/82 | HR 74 | Ht 69.0 in | Wt 207.0 lb

## 2016-02-24 DIAGNOSIS — I2510 Atherosclerotic heart disease of native coronary artery without angina pectoris: Secondary | ICD-10-CM | POA: Diagnosis not present

## 2016-02-24 DIAGNOSIS — I10 Essential (primary) hypertension: Secondary | ICD-10-CM

## 2016-02-24 DIAGNOSIS — I429 Cardiomyopathy, unspecified: Secondary | ICD-10-CM | POA: Diagnosis not present

## 2016-02-24 DIAGNOSIS — I428 Other cardiomyopathies: Secondary | ICD-10-CM

## 2016-02-24 NOTE — Progress Notes (Signed)
Cardiology Office Note  Date: 02/24/2016   ID: [name], DOB August 28, 1930, MRN [id number]  PCP: Robert Bellow, MD  Primary Cardiologist: Rozann Lesches, MD   Chief Complaint  Patient presents with  . Coronary Artery Disease  . Cardiomyopathy    History of Present Illness: [name] is an 82 y.o. female last seen in November 2016.She presents for a routine follow-up visit. Still doing very well, tending her flowers once again this Spring, reports NYHA class II dyspnea and no angina symptoms. She and her husband of 86 years still live in their own home.  I reviewed her medications. Cardiac regimen includes aspirin, lisinopril, Aldactone, and Zocor.  She continues to follow with Dr. Karie Kirks for primary care.  Past Medical History  Diagnosis Date  . Nonischemic cardiomyopathy (HCC)     LVEF improved to 50-55% as of 2014  . Hodgkin's disease ([place])   . History of breast cancer 1987    Right-sided, mastectomy with axillary node dissection and Adriamycin   . DVT (deep venous thrombosis) ([place])   . Obesity   . Essential hypertension   . Hypothyroidism   . History of thyroid cancer   . Asthma   . Osteoporosis   . Peripheral arterial disease ([place])     Stent to the left common iliac in 2006  . Coronary atherosclerosis     Cardiac catheterization 2004 with 60% mid LAD    Current Outpatient Prescriptions  Medication Sig Dispense Refill  . aspirin 81 MG tablet Take 81 mg by mouth daily.    . Calcium Carbonate-Vitamin D (CALCIUM 600 + D PO) Take 1 tablet by mouth daily.    . diphenhydramine-acetaminophen (TYLENOL PM) 25-500 MG TABS Take 1 tablet by mouth at bedtime as needed.    Marland Kitchen levothyroxine (SYNTHROID, LEVOTHROID) 100 MCG tablet Take 100 mcg by mouth daily.    Marland Kitchen lisinopril (PRINIVIL,ZESTRIL) 10 MG tablet Take 10 mg by mouth daily.    . Multiple Vitamins-Calcium (ONE-A-DAY WOMENS PO) Take 1 tablet by mouth daily.    Marland Kitchen QVAR 80 MCG/ACT inhaler     .  simvastatin (ZOCOR) 40 MG tablet Take 1 tablet (40 mg total) by mouth at bedtime. 90 tablet 3  . spironolactone (ALDACTONE) 25 MG tablet Take 12.5 mg by mouth daily.     . temazepam (RESTORIL) 30 MG capsule Take 30 mg by mouth at bedtime.     . vitamin C (ASCORBIC ACID) 500 MG tablet Take 500 mg by mouth daily.     No current facility-administered medications for this visit.   Allergies:  Review of patient's allergies indicates no known allergies.   Social History: The patient  reports that she has never smoked. She has never used smokeless tobacco. She reports that she does not drink alcohol or use illicit drugs.   ROS:  Please see the history of present illness. Otherwise, complete review of systems is positive for arthritis symptoms.  All other systems are reviewed and negative.   Physical Exam: VS:  BP 160/82 mmHg  Pulse 74  Ht 5\' 9"  (1.753 m)  Wt 207 lb (93.895 kg)  BMI 30.55 kg/m2  SpO2 95%, BMI Body mass index is 30.55 kg/(m^2).  Wt Readings from Last 3 Encounters:  02/24/16 207 lb (93.895 kg)  08/12/15 207 lb (93.895 kg)  03/26/15 210 lb 6.4 oz (95.437 kg)    Patient appears comfortable at rest. HEENT: Conjunctiva and lids normal, oropharynx clear. Neck: Supple, no elevated JVP or  carotid bruits, no thyromegaly. Lungs: Clear to auscultation, nonlabored breathing at rest. Cardiac: Regular rate and rhythm, no S3, soft systolic murmur, no pericardial rub. Abdomen: Soft, nontender, bowel sounds present, no guarding or rebound. Extremities: No pitting edema, distal pulses 1-2+.  ECG: I personally reviewed the tracing from 02/10/2015 which showed sinus rhythm with small R' in lead V1 and V2, nonspecific T-wave changes.  Recent Labwork: 03/26/2015: ALT 21; AST 25; BUN 34*; Creatinine, Ser 1.42*; Hemoglobin 12.0; Platelets 267; Potassium 4.2; Sodium 138     Component Value Date/Time   CHOL 176 02/28/2013 1013   TRIG 49 02/28/2013 1013   HDL 70 02/28/2013 1013   CHOLHDL 2.5  02/28/2013 1013   VLDL 10 02/28/2013 1013   LDLCALC 96 02/28/2013 1013    Other Studies Reviewed Today:  Echocardiogram from June 2014 reported LVEF 50-55% with mild lateral hypokinesis, grade 1 diastolic dysfunction, MAC with mild mitral regurgitation.  Assessment and Plan:  1. History of nonischemic cardiomyopathy with improvement in LVEF on medical therapy. She remained symptomatically stable. No changes were made today.  2. Moderate coronary atherosclerosis by prior evaluation, no active angina symptoms. ECG reviewed today shows sinus rhythm with nonspecific T-wave changes and low voltage. Continue observation.  Current medicines were reviewed with the patient today.   Orders Placed This Encounter  Procedures  . EKG 12-Lead    Disposition: FU with me in 6 months.   Signed, Satira Sark, MD, [facility] 02/24/2016 10:14 AM    [place] at [place]. [address], [place], [place] [address] Phone: [phone]; Fax: [phone]

## 2016-02-24 NOTE — Patient Instructions (Signed)
Your physician wants you to follow-up in: 6 months with Dr McDowell You will receive a reminder letter in the mail two months in advance. If you don't receive a letter, please call our office to schedule the follow-up appointment.     Your physician recommends that you continue on your current medications as directed. Please refer to the Current Medication list given to you today.    If you need a refill on your cardiac medications before your next appointment, please call your pharmacy.     Thank you for choosing [facility] !        

## 2016-03-29 ENCOUNTER — Encounter (HOSPITAL_COMMUNITY): Payer: Medicare HMO | Attending: Hematology & Oncology | Admitting: Hematology & Oncology

## 2016-03-29 ENCOUNTER — Encounter (HOSPITAL_COMMUNITY): Payer: Self-pay | Admitting: Hematology & Oncology

## 2016-03-29 VITALS — BP 161/81 | HR 70 | Temp 97.9°F | Resp 18 | Wt 204.8 lb

## 2016-03-29 DIAGNOSIS — Z853 Personal history of malignant neoplasm of breast: Secondary | ICD-10-CM

## 2016-03-29 DIAGNOSIS — Z85850 Personal history of malignant neoplasm of thyroid: Secondary | ICD-10-CM

## 2016-03-29 DIAGNOSIS — C50911 Malignant neoplasm of unspecified site of right female breast: Secondary | ICD-10-CM

## 2016-03-29 DIAGNOSIS — Z8571 Personal history of Hodgkin lymphoma: Secondary | ICD-10-CM | POA: Diagnosis not present

## 2016-03-29 DIAGNOSIS — C73 Malignant neoplasm of thyroid gland: Secondary | ICD-10-CM

## 2016-03-29 DIAGNOSIS — C8190 Hodgkin lymphoma, unspecified, unspecified site: Secondary | ICD-10-CM

## 2016-03-29 DIAGNOSIS — N189 Chronic kidney disease, unspecified: Secondary | ICD-10-CM | POA: Diagnosis not present

## 2016-03-29 NOTE — Progress Notes (Signed)
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 Padilla is here because of a history of breast cancer, Hodgkin disease, thyroid carcinoma.  [name] is unaccompanied.   Her husband passed away on 2023/03/25. He was 80 years-old. She was prepared for this as he had stopped eating or drinking. States that, "God had me prepared" and she is doing fine.  Her grandson from [place] stops by to help her out and mows the yard. She continues to attend church regularly. She follows with PCP Dr. Karie Kirks. She stays active and does her own housework.   She was sent to the skin specialist referred by Dr. Karie Kirks about a lesion on her right lower extremity. Insurance would not cover to remove it. She describes it, "as ugly".   She reports stiffness in her right shoulder. She is able to lift her right arm above her head with some effort.  She continues to get yearly mammograms with her last one in February. She has no complaints.   MEDICAL HISTORY:  Past Medical History  Diagnosis Date  . Nonischemic cardiomyopathy (HCC)     LVEF improved to 50-55% as of 2014  . Hodgkin's disease ([place])   . History of breast cancer 1987    Right-sided, mastectomy with axillary node dissection and Adriamycin   . DVT (deep venous thrombosis) ([place])   . Obesity   . Essential hypertension   . Hypothyroidism   . History of thyroid cancer   . Asthma   .  Osteoporosis   . Peripheral arterial disease ([place])     Stent to the left common iliac in 2006  . Coronary atherosclerosis     Cardiac catheterization 2004 with 60% mid LAD    SURGICAL HISTORY: Past Surgical History  Procedure Laterality Date  . Mastectomy partial / lumpectomy w/ axillary lymphadenectomy Right 1987  . Angioplasty / stenting femoral Left 07/13/2005  . Thyroidectomy    . Bunionectomy Right   . Abdominal hysterectomy    . Colonoscopy    . Appendectomy    . Tonsillectomy    . Cataract extraction w/phaco  05/28/2012    Procedure: CATARACT EXTRACTION PHACO AND INTRAOCULAR LENS PLACEMENT (IOC);  Surgeon: Elta Guadeloupe T. Gershon Crane, MD;  Location: [facility];  Service: Ophthalmology;  Laterality: Right;  CDE 12.86  . Cataract extraction w/phaco  06/04/2012    Procedure: CATARACT EXTRACTION PHACO AND INTRAOCULAR LENS PLACEMENT (IOC);  Surgeon: Elta Guadeloupe T. Gershon Crane, MD;  Location: [facility];  Service: Ophthalmology;  Laterality: Left;  CDE:15.32    SOCIAL HISTORY: Social History   Social History  . Marital Status: Married    Spouse Name: N/A  . Number of Children: N/A  . Years of Education: N/A   Occupational History  . Not on file.   Social History Main Topics  . Smoking status: Never Smoker   . Smokeless tobacco: Never Used  . Alcohol Use: No  . Drug Use: No  . Sexual Activity: No   Other Topics Concern  .  Not on file   Social History Narrative    FAMILY HISTORY: Family History  Problem Relation Age of Onset  . Lung cancer Mother   . Heart attack Father    indicated that her mother is deceased. She indicated that her father is deceased. She indicated that her daughter is deceased.   ALLERGIES:  has No Known Allergies.  MEDICATIONS:  Current Outpatient Prescriptions  Medication Sig Dispense Refill  . aspirin 81 MG tablet Take 81 mg by mouth daily.    . Calcium Carbonate-Vitamin D (CALCIUM 600 + D PO) Take 1 tablet by mouth daily.    . diphenhydramine-acetaminophen  (TYLENOL PM) 25-500 MG TABS Take 1 tablet by mouth at bedtime as needed.    Marland Kitchen levothyroxine (SYNTHROID, LEVOTHROID) 100 MCG tablet Take 100 mcg by mouth daily.    Marland Kitchen lisinopril (PRINIVIL,ZESTRIL) 10 MG tablet Take 10 mg by mouth daily.    . Multiple Vitamins-Calcium (ONE-A-DAY WOMENS PO) Take 1 tablet by mouth daily.    Marland Kitchen QVAR 80 MCG/ACT inhaler     . simvastatin (ZOCOR) 40 MG tablet Take 1 tablet (40 mg total) by mouth at bedtime. 90 tablet 3  . spironolactone (ALDACTONE) 25 MG tablet Take 12.5 mg by mouth daily.     . temazepam (RESTORIL) 30 MG capsule Take 30 mg by mouth at bedtime.     . vitamin C (ASCORBIC ACID) 500 MG tablet Take 500 mg by mouth daily.     No current facility-administered medications for this visit.    Review of Systems  Constitutional: Negative.   HENT: Negative.   Eyes: Negative.   Respiratory: Negative.   Cardiovascular: Negative.   Gastrointestinal: Negative.   Genitourinary: Negative.   Musculoskeletal: Negative.        Right shoulder pain / stiffness with movement  Skin: Negative.   Neurological: Negative.   Endo/Heme/Allergies: Negative.   Psychiatric/Behavioral: Negative.   All other systems reviewed and are negative.  14 point ROS was done and is otherwise as detailed above or in HPI   PHYSICAL EXAMINATION: ECOG PERFORMANCE STATUS: 0 - Asymptomatic  Filed Vitals:   03/29/16 1001  BP: 161/81  Pulse: 70  Temp: 97.9 F (36.6 C)  Resp: 18   Filed Weights   03/29/16 1001  Weight: 204 lb 12.8 oz (92.897 kg)     Physical Exam  Constitutional: She is oriented to person, place, and time and well-developed, well-nourished, and in no distress.  HENT:  Head: Normocephalic and atraumatic.  Nose: Nose normal.  Mouth/Throat: Oropharynx is clear and moist. No oropharyngeal exudate.  Eyes: Conjunctivae and EOM are normal. Pupils are equal, round, and reactive to light. Right eye exhibits no discharge. Left eye exhibits no discharge. No scleral  icterus.  Neck: Normal range of motion. Neck supple. No tracheal deviation present. No thyromegaly present.  Cardiovascular: Normal rate, regular rhythm and normal heart sounds.  Exam reveals no gallop and no friction rub.   No murmur heard. Pulmonary/Chest: Effort normal and breath sounds normal. She has no wheezes. She has no rales.    Abdominal: Soft. Bowel sounds are normal. She exhibits no distension and no mass. There is no tenderness. There is no rebound and no guarding.  Musculoskeletal: Normal range of motion. She exhibits no edema.  Right shoulder grinding/grating palpated with arm raise  Lymphadenopathy:    She has no cervical adenopathy.  Neurological: She is alert and oriented to person, place, and time. She has normal reflexes. No cranial nerve deficit.  Gait normal. Coordination normal.  Skin: Skin is warm and dry. No rash noted.  Psychiatric: Mood, memory, affect and judgment normal.  Nursing note and vitals reviewed.    LABORATORY DATA:  I have reviewed the data as listed Lab Results  Component Value Date   WBC 6.5 03/26/2015   HGB 12.0 03/26/2015   HCT 37.8 03/26/2015   MCV 87.9 03/26/2015   PLT 267 03/26/2015   CMP     Component Value Date/Time   NA 138 03/26/2015 1301   K 4.2 03/26/2015 1301   CL 104 03/26/2015 1301   CO2 25 03/26/2015 1301   GLUCOSE 113* 03/26/2015 1301   BUN 34* 03/26/2015 1301   CREATININE 1.42* 03/26/2015 1301   CREATININE 1.62* 02/28/2013 1013   CALCIUM 7.7* 03/26/2015 1301   PROT 7.6 03/26/2015 1301   ALBUMIN 3.9 03/26/2015 1301   AST 25 03/26/2015 1301   ALT 21 03/26/2015 1301   ALKPHOS 61 03/26/2015 1301   BILITOT 0.6 03/26/2015 1301   GFRNONAA 33* 03/26/2015 1301   GFRAA 39* 03/26/2015 1301     RADIOGRAPHIC STUDIES: I have personally reviewed the radiological images as listed and agreed with the findings in the report. MM DIGITAL SCREENING BILATERAL   Status: Final result       Study Result     Study Result       CLINICAL DATA: Screening.  EXAM: DIGITAL SCREENING BILATERAL MAMMOGRAM WITH 3D TOMO WITH CAD  COMPARISON: Previous exam(s).  ACR Breast Density Category b: There are scattered areas of fibroglandular density.  FINDINGS: There are no findings suspicious for malignancy. Images were processed with CAD.  IMPRESSION: No mammographic evidence of malignancy. A result letter of this screening mammogram will be mailed directly to the patient.  RECOMMENDATION: Screening mammogram in one year. (Code:SM-B-01Y)  BI-RADS CATEGORY 1: Negative.   Electronically Signed  By: Abelardo Diesel M.D.  On: 11/19/2015 12:17     ASSESSMENT & PLAN:  History of Stage II carcinoma of R breast, ER+ History of Hodgkin Lymphoma History of Thyroid Carcinoma CKD  She seems to be doing well without evidence of any recurrent disease.  Dr. Karie Kirks orders her mammograms. Breast exam is benign.  Labs are unremarkable except for CKD which appears to be stable.  She would like to continue with yearly visits and given her complex cancer history this is very reasonable.  She is up to date on her mammograms.  Her next is due February 2018.  She will contact us or Dr. Karie Kirks if the stiffness in her right shoulder worsens.   She will return for follow up in 1 year.  All questions were answered. The patient knows to call the clinic with any problems, questions or concerns.  This note was electronically signed.   This document serves as a record of services personally performed by Ancil Linsey, MD. It was created on her behalf by Arlyce Harman, a trained medical scribe. The creation of this record is based on the scribe's personal observations and the provider's statements to them. This document has been checked and approved by the attending provider.  I have reviewed the above documentation for accuracy and completeness, and I agree with the above. Molli Hazard, MD

## 2016-03-29 NOTE — Patient Instructions (Signed)
[place] at [facility] Discharge Instructions  RECOMMENDATIONS MADE BY THE CONSULTANT AND ANY TEST RESULTS WILL BE SENT TO YOUR REFERRING PHYSICIAN.  Return to Clinic in 1 year. Please call the clinic with any concerns.  Thank you for choosing [place] at [facility] to provide your oncology and hematology care.  To afford each patient quality time with our provider, please arrive at least 15 minutes before your scheduled appointment time.   Beginning January 23rd 2017 lab work for the [facility] will be done in the  Main lab at [facility] on 1st floor. If you have a lab appointment with the [facility] please come in thru the  Main Entrance and check in at the main information desk  You need to re-schedule your appointment should you arrive 10 or more minutes late.  We strive to give you quality time with our providers, and arriving late affects you and other patients whose appointments are after yours.  Also, if you no show three or more times for appointments you may be dismissed from the clinic at the providers discretion.     Again, thank you for choosing [facility].  Our hope is that these requests will decrease the amount of time that you wait before being seen by our physicians.       _____________________________________________________________  Should you have questions after your visit to [facility], please contact our office at [phone] between the hours of 8:30 a.m. and 4:30 p.m.  Voicemails left after 4:30 p.m. will not be returned until the following business day.  For prescription refill requests, have your pharmacy contact our office.         Resources For Cancer Patients and their Caregivers ? American Cancer Society: Can assist with transportation, wigs, general needs, runs Look Good Feel Better.        626 816 1228 ? Cancer Care: Provides financial assistance, online support  groups, medication/co-pay assistance.  1-800-813-HOPE 878-281-1571) ? [place] Assists [place] cancer patients and their families through emotional , educational and financial support.  225 555 2355 ? [place] DSS Where to apply for food stamps, Medicaid and utility assistance. (310)062-9121 ? [facility]: Transportation to medical appointments. 760-679-6921 ? Social Security Administration: May apply for disability if have a Stage IV cancer. (214)638-3050 8482211204 ? [employer], Disability and Transit Services: Assists with nutrition, care and transit needs. [place] Support Programs: @10RELATIVEDAYS @ > Cancer Support Group  2nd Tuesday of the month 1pm-2pm, Journey Room  > Creative Journey  3rd Tuesday of the month 1130am-1pm, Journey Room  > Look Good Feel Better  1st Wednesday of the month 10am-12 noon, Journey Room (Call [place] to register [phone])

## 2016-04-22 ENCOUNTER — Encounter (HOSPITAL_COMMUNITY): Payer: Self-pay | Admitting: Hematology & Oncology

## 2016-08-17 NOTE — Progress Notes (Signed)
Cardiology Office Note  Date: 08/22/2016   ID: [name], DOB 11-04-1929, MRN [id number]  PCP: Robert Bellow, MD  Primary Cardiologist: Rozann Lesches, MD   Chief Complaint  Patient presents with  . Cardiomyopathy    History of Present Illness: [name] is an 82 y.o. female last seen in June. She presents for a follow-up visit today. Tells me that her husband passed away this summer, they had been married almost 1 years. His health had been declining steadily. She seems to be doing reasonably well and has good support from family and friends.  She does not report any angina symptoms and has NYHA class II dyspnea. She still exercises at the [facility] regularly.  Echocardiogram from 2014 is summarized below. He had held off on follow-up testing in light of her clinical stability.  Past Medical History:  Diagnosis Date  . Asthma   . Coronary atherosclerosis    Cardiac catheterization 2004 with 60% mid LAD  . DVT (deep venous thrombosis) ([place])   . Essential hypertension   . History of breast cancer 1987   Right-sided, mastectomy with axillary node dissection and Adriamycin   . History of thyroid cancer   . Hodgkin's disease ([place])   . Hypothyroidism   . Nonischemic cardiomyopathy (HCC)    LVEF improved to 50-55% as of 2014  . Obesity   . Osteoporosis   . Peripheral arterial disease ([place])    Stent to the left common iliac in 2006    Current Outpatient Prescriptions  Medication Sig Dispense Refill  . aspirin 81 MG tablet Take 81 mg by mouth daily.    . Calcium Carbonate-Vitamin D (CALCIUM 600 + D PO) Take 1 tablet by mouth daily.    . diphenhydramine-acetaminophen (TYLENOL PM) 25-500 MG TABS Take 1 tablet by mouth at bedtime as needed.    Marland Kitchen levothyroxine (SYNTHROID, LEVOTHROID) 100 MCG tablet Take 100 mcg by mouth daily.    Marland Kitchen lisinopril (PRINIVIL,ZESTRIL) 10 MG tablet Take 10 mg by mouth daily.    . Multiple Vitamins-Calcium (ONE-A-DAY WOMENS PO)  Take 1 tablet by mouth daily.    Marland Kitchen QVAR 80 MCG/ACT inhaler     . simvastatin (ZOCOR) 40 MG tablet Take 1 tablet (40 mg total) by mouth at bedtime. 90 tablet 3  . spironolactone (ALDACTONE) 25 MG tablet Take 12.5 mg by mouth daily.     . temazepam (RESTORIL) 30 MG capsule Take 30 mg by mouth at bedtime.     . vitamin C (ASCORBIC ACID) 500 MG tablet Take 500 mg by mouth daily.     No current facility-administered medications for this visit.    Allergies:  Patient has no known allergies.   Social History: The patient  reports that she has never smoked. She has never used smokeless tobacco. She reports that she does not drink alcohol or use drugs.   ROS:  Please see the history of present illness. Otherwise, complete review of systems is positive for none.  All other systems are reviewed and negative.   Physical Exam: VS:  BP (!) 142/80   Pulse 78   Ht 5\' 9"  (1.753 m)   Wt 203 lb (92.1 kg)   SpO2 91%   BMI 29.98 kg/m , BMI Body mass index is 29.98 kg/m.  Wt Readings from Last 3 Encounters:  08/22/16 203 lb (92.1 kg)  03/29/16 204 lb 12.8 oz (92.9 kg)  02/24/16 207 lb (93.9 kg)    Patient appears comfortable  at rest. HEENT: Conjunctiva and lids normal, oropharynx clear. Neck: Supple, no elevated JVP or carotid bruits, no thyromegaly. Lungs: Clear to auscultation, nonlabored breathing at rest. Cardiac: Regular rate and rhythm, no S3, soft systolic murmur, no pericardial rub. Abdomen: Soft, nontender, bowel sounds present, no guarding or rebound. Extremities: No pitting edema, distal pulses 1-2+.  ECG: I personally reviewed the tracing from 02/24/2016 which shows sinus rhythm with low voltage and nonspecific T-wave changes.  Recent Labwork:  July 2016: Potassium 4.2, BUN 34, creatinine 1.4, AST 25, ALT 21, hemoglobin 12.0, platelets 267  Other Studies Reviewed Today:  Echocardiogram from June 2014 reported LVEF 50-55% with mild lateral hypokinesis, grade 1 diastolic dysfunction,  MAC with mild mitral regurgitation.  Assessment and Plan:  1. History of nonischemic cardiomyopathy, clinically stable on medical therapy. Last assessment of LVEF was 50-55%. Continue with regular exercise and observation.  2. Essential hypertension, no changes made present regimen. Keep follow-up with Dr. Karie Kirks.  Current medicines were reviewed with the patient today.  Disposition: Follow-up in 6 months.  Signed, Satira Sark, MD, [facility] 08/22/2016 11:03 AM    [place] at [place]. [address], [place], [place] [address] Phone: [phone]; Fax: [phone]

## 2016-08-22 ENCOUNTER — Encounter: Payer: Self-pay | Admitting: Cardiology

## 2016-08-22 ENCOUNTER — Ambulatory Visit (INDEPENDENT_AMBULATORY_CARE_PROVIDER_SITE_OTHER): Payer: Medicare HMO | Admitting: Cardiology

## 2016-08-22 VITALS — BP 142/80 | HR 78 | Ht 69.0 in | Wt 203.0 lb

## 2016-08-22 DIAGNOSIS — I10 Essential (primary) hypertension: Secondary | ICD-10-CM | POA: Diagnosis not present

## 2016-08-22 DIAGNOSIS — I428 Other cardiomyopathies: Secondary | ICD-10-CM | POA: Diagnosis not present

## 2016-08-22 NOTE — Patient Instructions (Signed)

## 2016-10-19 ENCOUNTER — Ambulatory Visit (HOSPITAL_COMMUNITY)
Admission: RE | Admit: 2016-10-19 | Discharge: 2016-10-19 | Disposition: A | Discharge status: Home / Self Care | Payer: Medicare HMO | Source: Ambulatory Visit | Attending: Family Medicine | Admitting: Family Medicine

## 2016-10-19 DIAGNOSIS — Z1231 Encounter for screening mammogram for malignant neoplasm of breast: Secondary | ICD-10-CM | POA: Diagnosis not present

## 2017-02-20 ENCOUNTER — Ambulatory Visit: Payer: Medicare HMO | Admitting: Cardiology

## 2017-04-02 ENCOUNTER — Ambulatory Visit (INDEPENDENT_AMBULATORY_CARE_PROVIDER_SITE_OTHER): Payer: Medicare HMO | Admitting: Cardiology

## 2017-04-02 ENCOUNTER — Encounter: Payer: Self-pay | Admitting: Cardiology

## 2017-04-02 VITALS — BP 126/70 | HR 83 | Ht 69.0 in | Wt 193.0 lb

## 2017-04-02 DIAGNOSIS — I428 Other cardiomyopathies: Secondary | ICD-10-CM | POA: Diagnosis not present

## 2017-04-02 DIAGNOSIS — I10 Essential (primary) hypertension: Secondary | ICD-10-CM | POA: Diagnosis not present

## 2017-04-02 DIAGNOSIS — Z853 Personal history of malignant neoplasm of breast: Secondary | ICD-10-CM | POA: Diagnosis not present

## 2017-04-02 DIAGNOSIS — I2510 Atherosclerotic heart disease of native coronary artery without angina pectoris: Secondary | ICD-10-CM | POA: Diagnosis not present

## 2017-04-02 NOTE — Progress Notes (Signed)
Cardiology Office Note  Date: 04/02/2017   ID: [name], DOB [birth year], MRN [id number]  PCP: Lemmie Evens, MD  Primary Cardiologist: Christine Lesches, MD   Chief Complaint  Patient presents with  . Cardiomyopathy    History of Present Illness: [name] is an 83 y.o. female last seen in November 2017. She presents for a follow-up visit. States that overall she has been doing fairly well, reports NYHA class II dyspnea with most activities. She still lives in her own home, her husband passed away last year.   I reviewed her medications which are outlined below. Cardiac regimen includes aspirin, lisinopril, Zocor, and Aldactone.  I personally reviewed her ECG today which shows sinus rhythm with decreased R wave progression and nonspecific T-wave changes.  Last echocardiogram was in 2014.  Past Medical History:  Diagnosis Date  . Asthma   . Coronary atherosclerosis    Cardiac catheterization 2004 with 60% mid LAD  . DVT (deep venous thrombosis) ([place])   . Essential hypertension   . History of breast cancer 1987   Right-sided, mastectomy with axillary node dissection and Adriamycin   . History of thyroid cancer   . Hodgkin's disease ([place])   . Hypothyroidism   . Nonischemic cardiomyopathy (HCC)    LVEF improved to 50-55% as of 2014  . Obesity   . Osteoporosis   . Peripheral arterial disease ([place])    Stent to the left common iliac in 2006    Past Surgical History:  Procedure Laterality Date  . ABDOMINAL HYSTERECTOMY    . ANGIOPLASTY / STENTING FEMORAL Left 07/13/2005  . APPENDECTOMY    . BUNIONECTOMY Right   . CATARACT EXTRACTION W/PHACO  05/28/2012   Procedure: CATARACT EXTRACTION PHACO AND INTRAOCULAR LENS PLACEMENT (IOC);  Surgeon: Elta Guadeloupe T. Gershon Crane, MD;  Location: [facility];  Service: Ophthalmology;  Laterality: Right;  CDE 12.86  . CATARACT EXTRACTION W/PHACO  06/04/2012   Procedure: CATARACT EXTRACTION PHACO AND INTRAOCULAR LENS PLACEMENT (IOC);   Surgeon: Elta Guadeloupe T. Gershon Crane, MD;  Location: [facility];  Service: Ophthalmology;  Laterality: Left;  CDE:15.32  . COLONOSCOPY    . MASTECTOMY PARTIAL / LUMPECTOMY W/ AXILLARY LYMPHADENECTOMY Right 1987  . THYROIDECTOMY    . TONSILLECTOMY      Current Outpatient Prescriptions  Medication Sig Dispense Refill  . aspirin 81 MG tablet Take 81 mg by mouth daily.    . Calcium Carbonate-Vitamin D (CALCIUM 600 + D PO) Take 1 tablet by mouth daily.    . diphenhydramine-acetaminophen (TYLENOL PM) 25-500 MG TABS Take 1 tablet by mouth at bedtime as needed.    Marland Kitchen levothyroxine (SYNTHROID, LEVOTHROID) 100 MCG tablet Take 100 mcg by mouth daily.    Marland Kitchen lisinopril (PRINIVIL,ZESTRIL) 10 MG tablet Take 10 mg by mouth daily.    . Multiple Vitamins-Calcium (ONE-A-DAY WOMENS PO) Take 1 tablet by mouth daily.    Marland Kitchen QVAR 80 MCG/ACT inhaler     . simvastatin (ZOCOR) 40 MG tablet Take 1 tablet (40 mg total) by mouth at bedtime. 90 tablet 3  . spironolactone (ALDACTONE) 25 MG tablet Take 12.5 mg by mouth daily.     . temazepam (RESTORIL) 30 MG capsule Take 30 mg by mouth at bedtime.     . vitamin C (ASCORBIC ACID) 500 MG tablet Take 500 mg by mouth daily.     No current facility-administered medications for this visit.    Allergies:  Patient has no known allergies.   Social History: The patient  reports that she has never smoked. She has never used smokeless tobacco. She reports that she does not drink alcohol or use drugs.   ROS:  Please see the history of present illness. Otherwise, complete review of systems is positive for occasional arthritic pain right shoulder.  All other systems are reviewed and negative.   Physical Exam: VS:  BP 126/70   Pulse 83   Ht _0  (1.753 m)   Wt 193 lb (87.5 kg)   SpO2 94%   BMI 28.50 kg/m , BMI Body mass index is 28.5 kg/m.  Wt Readings from Last 3 Encounters:  04/02/17 193 lb (87.5 kg)  08/22/16 203 lb (92.1 kg)  03/29/16 204 lb 12.8 oz (92.9 kg)    Elderly woman, appears  comfortable at rest. HEENT: Conjunctiva and lids normal, oropharynx clear. Neck: Supple, no elevated JVP or carotid bruits, no thyromegaly. Lungs: Clear to auscultation, nonlabored breathing at rest. Cardiac: Regular rate and rhythm, no S3, soft systolic murmur, no pericardial rub. Abdomen: Soft, nontender, bowel sounds present, no guarding or rebound. Extremities: No pitting edema, distal pulses 1-2+. Skin: Warm and dry. Musculoskeletal: No kyphosis. Neuropsychiatric: Alert and oriented 3, affect appropriate.  ECG: I personally reviewed the tracing from 02/24/2016 which shows sinus rhythm with low voltage and nonspecific T-wave changes.  Recent Labwork:  July 2017: Potassium 4.2, BUN 34, creatinine 1.4, AST 25, ALT 21, hemoglobin 12.0, platelets 267  Other Studies Reviewed Today:  Echocardiogram from June 2014 reported LVEF 50-55% with mild lateral hypokinesis, grade 1 diastolic dysfunction, MAC with mild mitral regurgitation.  Assessment and Plan:  1. Nonischemic cardiomyopathy, symptomatically stable on medical therapy. Follow-up echocardiogram will be obtained in comparison to study from 2014. Last LVEF improved to the range of 50-55%.  2. Essential hypertension, blood pressure is well controlled today. No changes were made.  3. CAD with moderate LAD disease documented by prior assessment. She does not report any angina symptoms. We are following this conservatively at this point. She remains on aspirin and statin.  4. History of breast cancer status post right-sided mastectomy and prior treatment with Adriamycin. She continues to follow in the oncology clinic.  Current medicines were reviewed with the patient today.   Orders Placed This Encounter  Procedures  . EKG 12-Lead  . ECHOCARDIOGRAM COMPLETE    Disposition: Follow-up in 6 months.  Signed, Satira Sark, MD, [facility] 04/02/2017 2:18 PM    [facility] at [facility] [address]  [place], [place] [address] Phone: [phone]; Fax: [phone]

## 2017-04-02 NOTE — Patient Instructions (Signed)
Your physician wants you to follow-up in: 6 months with Dr Ferne Reus will receive a reminder letter in the mail two months in advance. If you don't receive a letter, please call our office to schedule the follow-up appointment.    Your physician has requested that you have an echocardiogram. Echocardiography is a painless test that uses sound waves to create images of your heart. It provides your doctor with information about the size and shape of your heart and how well your heart's chambers and valves are working. This procedure takes approximately one hour. There are no restrictions for this procedure.    Your physician recommends that you continue on your current medications as directed. Please refer to the Current Medication list given to you today.    If you need a refill on your cardiac medications before your next appointment, please call your pharmacy.    No lab work ordered today.       Thank you for choosing [place] !

## 2017-04-03 ENCOUNTER — Encounter (HOSPITAL_COMMUNITY): Payer: Medicare HMO | Attending: Oncology | Admitting: Oncology

## 2017-04-03 ENCOUNTER — Ambulatory Visit (HOSPITAL_COMMUNITY): Payer: Medicare HMO

## 2017-04-03 ENCOUNTER — Encounter (HOSPITAL_COMMUNITY): Payer: Self-pay | Admitting: Oncology

## 2017-04-03 DIAGNOSIS — C73 Malignant neoplasm of thyroid gland: Secondary | ICD-10-CM | POA: Diagnosis not present

## 2017-04-03 DIAGNOSIS — Z853 Personal history of malignant neoplasm of breast: Secondary | ICD-10-CM | POA: Insufficient documentation

## 2017-04-03 DIAGNOSIS — C8100 Nodular lymphocyte predominant Hodgkin lymphoma, unspecified site: Secondary | ICD-10-CM

## 2017-04-03 DIAGNOSIS — C8190 Hodgkin lymphoma, unspecified, unspecified site: Secondary | ICD-10-CM | POA: Insufficient documentation

## 2017-04-03 HISTORY — DX: Hodgkin lymphoma, unspecified, unspecified site: C81.90

## 2017-04-03 HISTORY — DX: Malignant neoplasm of thyroid gland: C73

## 2017-04-03 NOTE — Progress Notes (Signed)
Lemmie Evens, MD [place] [address]  History of right breast cancer  Nodular lymphocyte predominant Hodgkin lymphoma, unspecified body region [facility])  Thyroid carcinoma ([place])  CURRENT THERAPY: Surveillance  INTERVAL HISTORY: [place] Coates 83 y.o. female returns for followup of a history of Stage II right breast cancer diagnosed in 1987 and managed with right lumpectomy, axillary LN dissection resulting in 1 positive LN, followed by adjuvant XRT and Tamoxifen (treated at [facility]). 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 AND Thyroid carcinoma with history of near total thyroidectomy.  HPI Elements   Location: Right breast  Quality: Invasive carcinoma  Severity: Stage II  Duration: Dx in 1987  Context: S/P right lumpectomy, XRT and Tamoxifen  Timing:   Modifying Factors: Treated at [facility] Signs & Symptoms:     She reports appetite and energy level at 100%.  Her weight is down approximately 10 pounds compared to last year.  She denies any new pain.  She denies any B symptoms.  "I feel real well for being by myself."    She reports chronic sleep problems.  She continues to remain very active.  She relays a story in which she was picking vegetables in the garden.  Review of Systems  Constitutional: Negative.  Negative for chills, fever and weight loss.  HENT: Negative.   Eyes: Negative.   Respiratory: Negative.  Negative for cough.   Cardiovascular: Negative.  Negative for chest pain.  Gastrointestinal: Negative.  Negative for blood in stool, constipation, diarrhea, melena, nausea and vomiting.  Genitourinary: Negative.   Musculoskeletal: Negative.   Skin: Negative.   Neurological: Negative.  Negative for weakness.  Endo/Heme/Allergies: Negative.   Psychiatric/Behavioral: The patient has insomnia.     Past Medical History:    Diagnosis Date  . Asthma   . Coronary atherosclerosis    Cardiac catheterization 2004 with 60% mid LAD  . DVT (deep venous thrombosis) ([place])   . Essential hypertension   . History of breast cancer 1987   Right-sided, mastectomy with axillary node dissection and Adriamycin   . History of thyroid cancer   . Hodgkin lymphoma ([place]) 04/03/2017  . Hodgkin's disease ([place])   . Hypothyroidism   . Nonischemic cardiomyopathy (HCC)    LVEF improved to 50-55% as of 2014  . Obesity   . Osteoporosis   . Peripheral arterial disease ([place])    Stent to the left common iliac in 2006  . Thyroid carcinoma ([place]) 04/03/2017    Past Surgical History:  Procedure Laterality Date  . ABDOMINAL HYSTERECTOMY    . ANGIOPLASTY / STENTING FEMORAL Left 07/13/2005  . APPENDECTOMY    . BUNIONECTOMY Right   . CATARACT EXTRACTION W/PHACO  05/28/2012   Procedure: CATARACT EXTRACTION PHACO AND INTRAOCULAR LENS PLACEMENT (IOC);  Surgeon: Elta Guadeloupe T. Gershon Crane, MD;  Location: [facility];  Service: Ophthalmology;  Laterality: Right;  CDE 12.86  . CATARACT EXTRACTION W/PHACO  06/04/2012   Procedure: CATARACT EXTRACTION PHACO AND INTRAOCULAR LENS PLACEMENT (IOC);  Surgeon: Elta Guadeloupe T. Gershon Crane, MD;  Location: [facility];  Service: Ophthalmology;  Laterality: Left;  CDE:15.32  . COLONOSCOPY    . MASTECTOMY PARTIAL / LUMPECTOMY W/ AXILLARY LYMPHADENECTOMY Right 1987  . THYROIDECTOMY    . TONSILLECTOMY      Family History  Problem Relation Age of Onset  . Lung cancer Mother   . Heart attack Father  Social History   Social History  . Marital status: Married    Spouse name: N/A  . Number of children: N/A  . Years of education: N/A   Social History Main Topics  . Smoking status: Never Smoker  . Smokeless tobacco: Never Used  . Alcohol use No  . Drug use: No  . Sexual activity: No   Other Topics Concern  . None   Social History Narrative  . None     PHYSICAL EXAMINATION  ECOG PERFORMANCE STATUS: 0 -  Asymptomatic  Vitals:   04/03/17 1100  BP: 136/78  Pulse: 74  Resp: 18  Temp: 97.8 F (36.6 C)    GENERAL:alert, no distress, well nourished, well developed, comfortable, cooperative, smiling and unaccompanied SKIN: skin color, texture, turgor are normal, no rashes or significant lesions HEAD: Normocephalic, No masses, lesions, tenderness or abnormalities EYES: normal, EOMI, Conjunctiva are pink and non-injected EARS: External ears normal OROPHARYNX:lips, buccal mucosa, and tongue normal and mucous membranes are moist  NECK: supple, no adenopathy, trachea midline LYMPH:  no palpable lymphadenopathy, no hepatosplenomegaly BREAST:not examined LUNGS: clear to auscultation and percussion HEART: regular rate & rhythm, no murmurs and no gallops ABDOMEN:abdomen soft, non-tender and normal bowel sounds BACK: Back symmetric, no curvature., No CVA tenderness EXTREMITIES:less then 2 second capillary refill, no joint deformities, effusion, or inflammation, no edema, no skin discoloration, no clubbing, no cyanosis  NEURO: alert & oriented x 3 with fluent speech, no focal motor/sensory deficits, gait normal   LABORATORY DATA: CBC    Component Value Date/Time   WBC 6.5 03/26/2015 1301   RBC 4.30 03/26/2015 1301   HGB 12.0 03/26/2015 1301   HCT 37.8 03/26/2015 1301   PLT 267 03/26/2015 1301   MCV 87.9 03/26/2015 1301   MCH 27.9 03/26/2015 1301   MCHC 31.7 03/26/2015 1301   RDW 15.8 (H) 03/26/2015 1301   LYMPHSABS 2.1 03/26/2015 1301   MONOABS 0.5 03/26/2015 1301   EOSABS 0.1 03/26/2015 1301   BASOSABS 0.0 03/26/2015 1301      Chemistry      Component Value Date/Time   NA 138 03/26/2015 1301   K 4.2 03/26/2015 1301   CL 104 03/26/2015 1301   CO2 25 03/26/2015 1301   BUN 34 (H) 03/26/2015 1301   CREATININE 1.42 (H) 03/26/2015 1301   CREATININE 1.62 (H) 02/28/2013 1013      Component Value Date/Time   CALCIUM 7.7 (L) 03/26/2015 1301   ALKPHOS 61 03/26/2015 1301   AST 25  03/26/2015 1301   ALT 21 03/26/2015 1301   BILITOT 0.6 03/26/2015 1301        PENDING LABS:   RADIOGRAPHIC STUDIES:  No results found.   PATHOLOGY:    ASSESSMENT AND PLAN:  History of right breast cancer History of Stage II right breast cancer diagnosed in 1987 and managed with right lumpectomy, axillary LN dissection resulting in 1 positive LN, followed by adjuvant XRT and Tamoxifen (treated at [facility]).  No oncology role for labs today.  I personally reviewed and went over radiographic studies with the patient.  The results are noted within this dictation.  I personally reviewed the images in PACS.  Mammogram in Jan 2018 was BIRADS 1.  She will be due in Jan 2019.  Dr. Karie Kirks orders her mammograms.  Return in 12 months for follow-up.  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  Thyroid carcinoma ([place]) Thyroid carcinoma with  history of near total thyroidectomy.   ORDERS PLACED FOR THIS ENCOUNTER: No orders of the defined types were placed in this encounter.   MEDICATIONS PRESCRIBED THIS ENCOUNTER: No orders of the defined types were placed in this encounter.   THERAPY PLAN:  Annual follow-up, given her complicated cancer history is not unreasonable.  All questions were answered. The patient knows to call the clinic with any problems, questions or concerns. We can certainly see the patient much sooner if necessary.  Patient and plan discussed with Dr. Twana First and she is in agreement with the aforementioned.   This note is electronically signed by: Doy Mince 04/03/2017 11:27 AM

## 2017-04-03 NOTE — Assessment & Plan Note (Signed)
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

## 2017-04-03 NOTE — Assessment & Plan Note (Addendum)
History of Stage II right breast cancer diagnosed in 1987 and managed with right lumpectomy, axillary LN dissection resulting in 1 positive LN, followed by adjuvant XRT and Tamoxifen (treated at [facility]).  No oncology role for labs today.  I personally reviewed and went over radiographic studies with the patient.  The results are noted within this dictation.  I personally reviewed the images in PACS.  Mammogram in Jan 2018 was BIRADS 1.  She will be due in Jan 2019.  Dr. Karie Kirks orders her mammograms.  Return in 12 months for follow-up.

## 2017-04-03 NOTE — Assessment & Plan Note (Signed)
Thyroid carcinoma with history of near total thyroidectomy.

## 2017-04-03 NOTE — Patient Instructions (Signed)
[place] at [facility] Discharge Instructions  RECOMMENDATIONS MADE BY THE CONSULTANT AND ANY TEST RESULTS WILL BE SENT TO YOUR REFERRING PHYSICIAN.  You were seen today by Kirby Crigler PA-C. Return in 12 months for follow up.   Thank you for choosing [place] at [facility] to provide your oncology and hematology care.  To afford each patient quality time with our provider, please arrive at least 15 minutes before your scheduled appointment time.    If you have a lab appointment with the [facility] please come in thru the  Main Entrance and check in at the main information desk  You need to re-schedule your appointment should you arrive 10 or more minutes late.  We strive to give you quality time with our providers, and arriving late affects you and other patients whose appointments are after yours.  Also, if you no show three or more times for appointments you may be dismissed from the clinic at the providers discretion.     Again, thank you for choosing [facility].  Our hope is that these requests will decrease the amount of time that you wait before being seen by our physicians.       _____________________________________________________________  Should you have questions after your visit to [facility], please contact our office at [phone] between the hours of 8:30 a.m. and 4:30 p.m.  Voicemails left after 4:30 p.m. will not be returned until the following business day.  For prescription refill requests, have your pharmacy contact our office.       Resources For Cancer Patients and their Caregivers ? American Cancer Society: Can assist with transportation, wigs, general needs, runs Look Good Feel Better.        403 451 4896 ? Cancer Care: Provides financial assistance, online support groups, medication/co-pay assistance.  1-800-813-HOPE (272) 666-8396) ? [place] Assists  [place] cancer patients and their families through emotional , educational and financial support.  217-029-1440 ? [place] DSS Where to apply for food stamps, Medicaid and utility assistance. (445) 817-2819 ? [facility]: Transportation to medical appointments. 559-828-1566 ? Social Security Administration: May apply for disability if have a Stage IV cancer. 684-301-7962 7621352033 ? [employer], Disability and Transit Services: Assists with nutrition, care and transit needs. [place] Support Programs: @10RELATIVEDAYS @ > Cancer Support Group  2nd Tuesday of the month 1pm-2pm, Journey Room  > Creative Journey  3rd Tuesday of the month 1130am-1pm, Journey Room  > Look Good Feel Better  1st Wednesday of the month 10am-12 noon, Journey Room (Call [place] to register [phone])

## 2017-04-04 ENCOUNTER — Ambulatory Visit (HOSPITAL_COMMUNITY)
Admission: RE | Admit: 2017-04-04 | Discharge: 2017-04-04 | Disposition: A | Discharge status: Home / Self Care | Payer: Medicare HMO | Source: Ambulatory Visit | Attending: Cardiology | Admitting: Cardiology

## 2017-04-04 DIAGNOSIS — I428 Other cardiomyopathies: Secondary | ICD-10-CM | POA: Diagnosis not present

## 2017-04-04 DIAGNOSIS — I340 Nonrheumatic mitral (valve) insufficiency: Secondary | ICD-10-CM | POA: Diagnosis not present

## 2017-04-04 NOTE — Progress Notes (Signed)
*  PRELIMINARY RESULTS* Echocardiogram 2D Echocardiogram has been performed.  [name] 04/04/2017, 11:03 AM

## 2017-04-05 ENCOUNTER — Telehealth: Payer: Self-pay

## 2017-04-05 LAB — ECHOCARDIOGRAM COMPLETE
CHL CUP MV DEC (S): 285
E decel time: 285 msec
EERAT: 11.02
LA vol: 37.2 mL
LAVOLA4C: 33.7 mL
LAVOLIN: 17.8 mL/m2
LDCA: 3.14 cm2
LV E/e' medial: 11.02
LV SIMPSON'S DISK: 33
LV dias vol: 126 mL — AB (ref 46–106)
LV e' LATERAL: 5.11 cm/s
LV sys vol index: 40 mL/m2
LV sys vol: 85 mL — AB (ref 14–42)
LVDIAVOLIN: 60 mL/m2
LVEEAVG: 11.02
LVOTD: 20 mm
MV pk E vel: 56.3 m/s
MVPKAVEL: 99.4 m/s
RV TAPSE: 17.8 mm
Reg peak vel: 211 cm/s
Stroke v: 42 ml
TDI e' lateral: 5.11
TDI e' medial: 4.03
TR max vel: 211 cm/s

## 2017-04-05 MED ORDER — CARVEDILOL 3.125 MG PO TABS: 3.1250 mg | ORAL_TABLET | Freq: Two times a day (BID) | ORAL | 3 refills | Status: DC

## 2017-04-05 NOTE — Telephone Encounter (Signed)
-----   Message from Satira Sark, MD sent at 04/05/2017 10:48 AM EDT ----- Results reviewed. LVEF has decreased compared to 2014 study. Now in the range of 30-35%. I reviewed her current medications at recent office visit. Let's add Coreg 3.125 mg twice daily for now. A copy of this test should be forwarded to Lemmie Evens, MD.

## 2017-04-05 NOTE — Telephone Encounter (Signed)
LMTCB-cc, e-scribed coreg to pharmacy already

## 2017-04-06 NOTE — Telephone Encounter (Signed)
I spoke with patient, she will pick up Coreg

## 2017-04-09 ENCOUNTER — Telehealth: Payer: Self-pay | Admitting: Cardiology

## 2017-04-09 MED ORDER — LISINOPRIL 10 MG PO TABS: 10.0000 mg | ORAL_TABLET | Freq: Every day | ORAL | 2 refills | Status: DC

## 2017-04-09 MED ORDER — CARVEDILOL 3.125 MG PO TABS: 3.1250 mg | ORAL_TABLET | Freq: Two times a day (BID) | ORAL | 3 refills | Status: DC

## 2017-04-09 MED ORDER — SPIRONOLACTONE 25 MG PO TABS
12.5000 mg | ORAL_TABLET | Freq: Every day | ORAL | 2 refills | Status: DC
Start: 2017-04-09 — End: 2018-10-01

## 2017-04-09 MED ORDER — SIMVASTATIN 40 MG PO TABS: 40.0000 mg | ORAL_TABLET | Freq: Every day | ORAL | 2 refills | Status: DC

## 2017-04-09 NOTE — Telephone Encounter (Signed)
Pt has moved pharmacies to [facility]-- needs Rx's sent there please

## 2017-09-10 ENCOUNTER — Other Ambulatory Visit (HOSPITAL_COMMUNITY): Payer: Self-pay | Admitting: Family Medicine

## 2017-09-10 DIAGNOSIS — Z1231 Encounter for screening mammogram for malignant neoplasm of breast: Secondary | ICD-10-CM

## 2017-09-21 ENCOUNTER — Encounter (HOSPITAL_COMMUNITY): Payer: Self-pay | Admitting: *Deleted

## 2017-09-21 ENCOUNTER — Encounter (HOSPITAL_COMMUNITY)
Admission: RE | Disposition: A | Discharge status: Home / Self Care | Payer: Self-pay | Source: Ambulatory Visit | Attending: Ophthalmology

## 2017-09-21 ENCOUNTER — Ambulatory Visit (HOSPITAL_COMMUNITY)
Admission: RE | Admit: 2017-09-21 | Discharge: 2017-09-21 | Disposition: A | Discharge status: Home / Self Care | Payer: Medicare HMO | Source: Ambulatory Visit | Attending: Ophthalmology | Admitting: Ophthalmology

## 2017-09-21 DIAGNOSIS — H26491 Other secondary cataract, right eye: Secondary | ICD-10-CM | POA: Insufficient documentation

## 2017-09-21 DIAGNOSIS — Z79899 Other long term (current) drug therapy: Secondary | ICD-10-CM | POA: Diagnosis not present

## 2017-09-21 DIAGNOSIS — Z7982 Long term (current) use of aspirin: Secondary | ICD-10-CM | POA: Insufficient documentation

## 2017-09-21 DIAGNOSIS — H2640 Unspecified secondary cataract: Secondary | ICD-10-CM | POA: Diagnosis present

## 2017-09-21 DIAGNOSIS — I10 Essential (primary) hypertension: Secondary | ICD-10-CM | POA: Diagnosis not present

## 2017-09-21 HISTORY — PX: YAG LASER APPLICATION: SHX6189

## 2017-09-21 SURGERY — TREATMENT, USING YAG LASER
Anesthesia: LOCAL | Laterality: Right

## 2017-09-21 MED ORDER — CYCLOPENTOLATE-PHENYLEPHRINE 0.2-1 % OP SOLN
1.0000 [drp] | OPHTHALMIC | Status: AC
  Administered 2017-09-21 (×2): 1 [drp] via OPHTHALMIC
  Filled 2017-09-21: qty 2

## 2017-09-21 NOTE — Op Note (Signed)
Christine Dail T. Gershon Crane, MD  Procedure: Yag Capsulotomy  Yag Laser Self Test Completedyes. Procedure: Posterior Capsulotomy, Eye Protection Worn by Staff yes. Laser In Use Sign on Door yes.  Laser: Nd:YAG Spot Size: Fixed Burst Mode: III Power Setting: 3.4 mJ/burst Number of shots: 22 Total energy delivered: 72.36 mJ   The patient tolerated the procedure without difficulty. No complications were encountered.   The patient was discharged home with the instructions to continue all her current glaucoma medications, if any.   Patient instructed to go to office at 1100 for intraocular pressure check.  Patient verbalizes understanding of discharge instructions Yes.  .  Pre-Operative Diagnosis: After-Cataract, obscuring vision, 366.53 OD Post-Operative Diagnosis: After-Cataract, obscuring vision, 366.53 OD Date of Cataract Surgery: Unknown

## 2017-09-21 NOTE — Discharge Instructions (Signed)
[place]  09/21/2017     Instructions    Activity: No Restrictions.   Diet: Resume Diet you were on at home.   Pain Medication: Tylenol if Needed.   CONTACT YOUR DOCTOR IF YOU HAVE PAIN, REDNESS IN YOUR EYE, OR DECREASED VISION.   Follow-up with Rutherford Guys, MD.   Dr. Gershon Crane: [phone]    If you find that you cannot contact your physician, but feel that your signs and   Symptoms warrant a physician's attention, call the Emergency Room at   [phone]    Tylenol for pain

## 2017-09-21 NOTE — H&P (Signed)
The patient was re examined and there is no change in the patients condition since the original H and P. 

## 2017-09-26 ENCOUNTER — Encounter (HOSPITAL_COMMUNITY): Payer: Self-pay | Admitting: Ophthalmology

## 2017-10-02 ENCOUNTER — Encounter (HOSPITAL_COMMUNITY): Payer: Self-pay | Admitting: *Deleted

## 2017-10-02 ENCOUNTER — Ambulatory Visit (HOSPITAL_COMMUNITY)
Admission: RE | Admit: 2017-10-02 | Discharge: 2017-10-02 | Disposition: A | Discharge status: Home / Self Care | Payer: Medicare HMO | Source: Ambulatory Visit | Attending: Ophthalmology | Admitting: Ophthalmology

## 2017-10-02 ENCOUNTER — Encounter (HOSPITAL_COMMUNITY)
Admission: RE | Disposition: A | Discharge status: Home / Self Care | Payer: Self-pay | Source: Ambulatory Visit | Attending: Ophthalmology

## 2017-10-02 DIAGNOSIS — I10 Essential (primary) hypertension: Secondary | ICD-10-CM | POA: Insufficient documentation

## 2017-10-02 DIAGNOSIS — Z79899 Other long term (current) drug therapy: Secondary | ICD-10-CM | POA: Diagnosis not present

## 2017-10-02 DIAGNOSIS — Z7982 Long term (current) use of aspirin: Secondary | ICD-10-CM | POA: Insufficient documentation

## 2017-10-02 DIAGNOSIS — H26492 Other secondary cataract, left eye: Secondary | ICD-10-CM | POA: Insufficient documentation

## 2017-10-02 HISTORY — PX: YAG LASER APPLICATION: SHX6189

## 2017-10-02 SURGERY — TREATMENT, USING YAG LASER
Anesthesia: LOCAL | Laterality: Left

## 2017-10-02 MED ORDER — CYCLOPENTOLATE-PHENYLEPHRINE 0.2-1 % OP SOLN
1.0000 [drp] | OPHTHALMIC | Status: AC
  Administered 2017-10-02 (×3): 1 [drp] via OPHTHALMIC

## 2017-10-02 MED ORDER — CYCLOPENTOLATE-PHENYLEPHRINE 0.2-1 % OP SOLN
OPHTHALMIC | Status: AC
  Filled 2017-10-02: qty 2

## 2017-10-02 NOTE — Discharge Instructions (Signed)
[place]  10/02/2017     Instructions    Activity: No Restrictions.   Diet: Resume Diet you were on at home.   Pain Medication: Tylenol if Needed.   CONTACT YOUR DOCTOR IF YOU HAVE PAIN, REDNESS IN YOUR EYE, OR DECREASED VISION.   Follow-up:today at 1pm with Rutherford Guys, MD.   Dr. Gershon Crane: [phone]    If you find that you cannot contact your physician, but feel that your signs and   Symptoms warrant a physician's attention, call the Emergency Room at   [phone] ext.[phone].

## 2017-10-02 NOTE — Op Note (Signed)
[name] T. Gershon Crane, MD  Procedure: Yag Capsulotomy  Yag Laser Self Test Completedyes. Procedure: Posterior Capsulotomy, Eye Protection Worn by Staff yes. Laser In Use Sign on Door yes.  Laser: Nd:YAG Spot Size: Fixed Burst Mode: III Power Setting: 3.4 mJ/burst Number of shots: 27 Total energy delivered: 87.35 mJ   The patient tolerated the procedure without difficulty. No complications were encountered.   The patient was discharged home with the instructions to continue all her current glaucoma medications, if any.   Patient instructed to go to office at 0100 for intraocular pressure check.  Patient verbalizes understanding of discharge instructions Yes.  .  Pre-Operative Diagnosis: After-Cataract, obscuring vision, 366.53 OS Post-Operative Diagnosis: After-Cataract, obscuring vision, 366.53 OS Date of Cataract Surgery: 2013

## 2017-10-02 NOTE — H&P (Signed)
The patient was re examined and there is no change in the patients condition since the original H and P. 

## 2017-10-03 ENCOUNTER — Encounter (HOSPITAL_COMMUNITY): Payer: Self-pay | Admitting: Ophthalmology

## 2017-10-08 NOTE — Progress Notes (Signed)
Cardiology Office Note  Date: 10/10/2017   ID: [name], DOB 05/25/1930, MRN [id number]  PCP: Lemmie Evens, MD  Primary Cardiologist: Rozann Lesches, MD   Chief Complaint  Patient presents with  . Cardiomyopathy    History of Present Illness: [name] is an 84 y.o. female last seen in July 2018. She presents today for a routine follow-up visit. She states that she has been doing reasonably well, although she is grieving the loss of her dog that died recently. She has not been exercising as much. She states that her children already plan to get her another dog.  Whenever her medications. Cardiac regimen includes aspirin, Coreg, lisinopril, Zocor, and Aldactone. She does not report any orthopnea or increasing leg swelling.  Echocardiogram from July 2018 revealed LVEF 30-35% range with mild diastolic dysfunction. Coreg was added.   Past Medical History:  Diagnosis Date  . Asthma   . Coronary atherosclerosis    Cardiac catheterization 2004 with 60% mid LAD  . DVT (deep venous thrombosis) ([place])   . Essential hypertension   . History of breast cancer 1987   Right-sided, mastectomy with axillary node dissection and Adriamycin   . History of thyroid cancer   . Hodgkin lymphoma ([place]) 04/03/2017  . Hodgkin's disease ([place])   . Hypothyroidism   . Nonischemic cardiomyopathy ([facility])    LVEF improved to 50-55% as of 2014  . Obesity   . Osteoporosis   . Peripheral arterial disease ([place])    Stent to the left common iliac in 2006  . Thyroid carcinoma ([place]) 04/03/2017    Past Surgical History:  Procedure Laterality Date  . ABDOMINAL HYSTERECTOMY    . ANGIOPLASTY / STENTING FEMORAL Left 07/13/2005  . APPENDECTOMY    . BUNIONECTOMY Right   . CATARACT EXTRACTION W/PHACO  05/28/2012   Procedure: CATARACT EXTRACTION PHACO AND INTRAOCULAR LENS PLACEMENT (IOC);  Surgeon: Elta Guadeloupe T. Gershon Crane, MD;  Location: [facility];  Service: Ophthalmology;  Laterality: Right;  CDE 12.86  .  CATARACT EXTRACTION W/PHACO  06/04/2012   Procedure: CATARACT EXTRACTION PHACO AND INTRAOCULAR LENS PLACEMENT (IOC);  Surgeon: Elta Guadeloupe T. Gershon Crane, MD;  Location: [facility];  Service: Ophthalmology;  Laterality: Left;  CDE:15.32  . COLONOSCOPY    . MASTECTOMY PARTIAL / LUMPECTOMY W/ AXILLARY LYMPHADENECTOMY Right 1987  . THYROIDECTOMY    . TONSILLECTOMY    . YAG LASER APPLICATION Right 73/22/0254   Procedure: YAG LASER APPLICATION;  Surgeon: Rutherford Guys, MD;  Location: [facility];  Service: Ophthalmology;  Laterality: Right;  . YAG LASER APPLICATION Left 11/01/621   Procedure: YAG LASER APPLICATION;  Surgeon: Rutherford Guys, MD;  Location: [facility];  Service: Ophthalmology;  Laterality: Left;    Current Outpatient Medications  Medication Sig Dispense Refill  . aspirin 81 MG tablet Take 81 mg by mouth daily.    . Calcium Carbonate-Vitamin D (CALCIUM 600 + D PO) Take 1 tablet by mouth daily.    . carvedilol (COREG) 3.125 MG tablet Take 1 tablet (3.125 mg total) by mouth 2 (two) times daily. 180 tablet 3  . diphenhydramine-acetaminophen (TYLENOL PM) 25-500 MG TABS Take 1 tablet by mouth at bedtime as needed.    Marland Kitchen levothyroxine (SYNTHROID, LEVOTHROID) 100 MCG tablet Take 100 mcg by mouth daily.    Marland Kitchen lisinopril (PRINIVIL,ZESTRIL) 10 MG tablet Take 1 tablet (10 mg total) by mouth daily. 90 tablet 2  . Multiple Vitamins-Calcium (ONE-A-DAY WOMENS PO) Take 1 tablet by mouth daily.    Marland Kitchen QVAR  80 MCG/ACT inhaler     . simvastatin (ZOCOR) 40 MG tablet Take 1 tablet (40 mg total) by mouth at bedtime. 90 tablet 2  . spironolactone (ALDACTONE) 25 MG tablet Take 0.5 tablets (12.5 mg total) by mouth daily. 45 tablet 2  . temazepam (RESTORIL) 30 MG capsule Take 30 mg by mouth at bedtime.     . vitamin C (ASCORBIC ACID) 500 MG tablet Take 500 mg by mouth daily.     No current facility-administered medications for this visit.    Allergies:  Patient has no known allergies.   Social History: The patient  reports that  has  never smoked. she has never used smokeless tobacco. She reports that she does not drink alcohol or use drugs.   ROS:  Please see the history of present illness. Otherwise, complete review of systems is positive for arthritic pains and stiffness.  All other systems are reviewed and negative.   Physical Exam: VS:  BP 124/72   Pulse 78   Ht 5\' 9"  (1.753 m)   Wt 199 lb (90.3 kg)   SpO2 98%   BMI 29.39 kg/m , BMI Body mass index is 29.39 kg/m.  Wt Readings from Last 3 Encounters:  10/10/17 199 lb (90.3 kg)  04/03/17 193 lb 14.4 oz (88 kg)  04/02/17 193 lb (87.5 kg)    General: Elderly woman, appears comfortable at rest. HEENT: Conjunctiva and lids normal, oropharynx clear. Neck: Supple, no elevated JVP or carotid bruits, no thyromegaly. Lungs: Clear to auscultation, nonlabored breathing at rest. Cardiac: Regular rate and rhythm, no S3, soft systolic murmur, no pericardial rub. Abdomen: Soft, nontender, bowel sounds present, no guarding or rebound. Extremities: No pitting edema, distal pulses 2+. Skin: Warm and dry. Musculoskeletal: No kyphosis. Neuropsychiatric: Alert and oriented x3, affect grossly appropriate.  ECG: I personally reviewed the tracing from 04/02/2017 which showed sinus rhythm with decreased R wave progression and nonspecific T-wave changes.  Recent Labwork:  July 2017: Potassium 4.2, BUN 34, creatinine 1.4, AST 25, ALT 21, hemoglobin 12.0, platelets 267  Other Studies Reviewed Today:  Echocardiogram 04/04/2017: Study Conclusions  - Left ventricle: The cavity size was normal. Wall thickness was   normal. Systolic function was moderately to severely reduced. The   estimated ejection fraction was in the range of 30% to 35%.   Doppler parameters are consistent with abnormal left ventricular   relaxation (grade 1 diastolic dysfunction). - Aortic valve: Valve area (VTI): 2.67 cm^2. Valve area (Vmax):   2.83 cm^2. - Mitral valve: Mildly calcified annulus. Mildly  thickened leaflets. There was mild regurgitation. - Technically difficult study.  Assessment and Plan:  1. Nonischemic cardiomyopathy, LVEF 30-35% range by last assessment. She has tolerated the addition of Coreg. Continue with present regimen and we will obtain a follow-up echocardiogram for her next visit.  2. Essential hypertension, blood pressure is well controlled today.  3. History of moderate LAD disease based on previous workup, no active angina symptoms. Plan to continue with conservative management on medical therapy.   Current medicines were reviewed with the patient today.   Orders Placed This Encounter  Procedures  . ECHOCARDIOGRAM COMPLETE    Disposition: Follow-up in the next 6 months.  Signed, Satira Sark, MD, [facility] 10/10/2017 12:12 PM    [place] at [place], [place], [place] [address] Phone: [phone]; Fax: [phone]

## 2017-10-10 ENCOUNTER — Ambulatory Visit: Payer: Medicare HMO | Admitting: Cardiology

## 2017-10-10 ENCOUNTER — Encounter: Payer: Self-pay | Admitting: Cardiology

## 2017-10-10 VITALS — BP 124/72 | HR 78 | Ht 69.0 in | Wt 199.0 lb

## 2017-10-10 DIAGNOSIS — I2510 Atherosclerotic heart disease of native coronary artery without angina pectoris: Secondary | ICD-10-CM

## 2017-10-10 DIAGNOSIS — I10 Essential (primary) hypertension: Secondary | ICD-10-CM | POA: Diagnosis not present

## 2017-10-10 DIAGNOSIS — I428 Other cardiomyopathies: Secondary | ICD-10-CM | POA: Diagnosis not present

## 2017-10-10 NOTE — Patient Instructions (Signed)

## 2017-10-22 ENCOUNTER — Encounter (HOSPITAL_COMMUNITY): Payer: Self-pay

## 2017-10-22 ENCOUNTER — Ambulatory Visit (HOSPITAL_COMMUNITY)
Admission: RE | Admit: 2017-10-22 | Discharge: 2017-10-22 | Disposition: A | Discharge status: Home / Self Care | Payer: Medicare HMO | Source: Ambulatory Visit | Attending: Family Medicine | Admitting: Family Medicine

## 2017-10-22 DIAGNOSIS — Z1231 Encounter for screening mammogram for malignant neoplasm of breast: Secondary | ICD-10-CM | POA: Insufficient documentation

## 2018-02-20 ENCOUNTER — Telehealth: Payer: Self-pay | Admitting: Cardiology

## 2018-02-20 NOTE — Telephone Encounter (Signed)
Echo scheduled at [facility] April 10, 2018  Arrive at 8:15

## 2018-04-09 ENCOUNTER — Encounter (HOSPITAL_COMMUNITY): Payer: Self-pay | Admitting: Internal Medicine

## 2018-04-09 ENCOUNTER — Ambulatory Visit (HOSPITAL_COMMUNITY): Payer: Medicare HMO | Admitting: Adult Health

## 2018-04-09 ENCOUNTER — Inpatient Hospital Stay (HOSPITAL_COMMUNITY): Payer: Medicare HMO | Attending: Hematology | Admitting: Internal Medicine

## 2018-04-09 VITALS — BP 115/70 | HR 80 | Temp 97.5°F | Resp 16 | Wt 199.5 lb

## 2018-04-09 DIAGNOSIS — Z8571 Personal history of Hodgkin lymphoma: Secondary | ICD-10-CM | POA: Insufficient documentation

## 2018-04-09 DIAGNOSIS — G4700 Insomnia, unspecified: Secondary | ICD-10-CM | POA: Diagnosis not present

## 2018-04-09 DIAGNOSIS — Z170 Estrogen receptor positive status [ER+]: Secondary | ICD-10-CM | POA: Diagnosis not present

## 2018-04-09 DIAGNOSIS — I2510 Atherosclerotic heart disease of native coronary artery without angina pectoris: Secondary | ICD-10-CM | POA: Diagnosis not present

## 2018-04-09 DIAGNOSIS — Z923 Personal history of irradiation: Secondary | ICD-10-CM | POA: Diagnosis not present

## 2018-04-09 DIAGNOSIS — Z85850 Personal history of malignant neoplasm of thyroid: Secondary | ICD-10-CM | POA: Insufficient documentation

## 2018-04-09 DIAGNOSIS — Z853 Personal history of malignant neoplasm of breast: Secondary | ICD-10-CM | POA: Diagnosis not present

## 2018-04-09 DIAGNOSIS — I739 Peripheral vascular disease, unspecified: Secondary | ICD-10-CM | POA: Diagnosis not present

## 2018-04-09 DIAGNOSIS — Z79899 Other long term (current) drug therapy: Secondary | ICD-10-CM | POA: Insufficient documentation

## 2018-04-09 DIAGNOSIS — M810 Age-related osteoporosis without current pathological fracture: Secondary | ICD-10-CM | POA: Insufficient documentation

## 2018-04-09 DIAGNOSIS — Z9223 Personal history of estrogen therapy: Secondary | ICD-10-CM | POA: Insufficient documentation

## 2018-04-09 DIAGNOSIS — I10 Essential (primary) hypertension: Secondary | ICD-10-CM | POA: Diagnosis not present

## 2018-04-09 DIAGNOSIS — Z7982 Long term (current) use of aspirin: Secondary | ICD-10-CM | POA: Insufficient documentation

## 2018-04-09 DIAGNOSIS — Z801 Family history of malignant neoplasm of trachea, bronchus and lung: Secondary | ICD-10-CM | POA: Diagnosis not present

## 2018-04-09 DIAGNOSIS — E890 Postprocedural hypothyroidism: Secondary | ICD-10-CM | POA: Insufficient documentation

## 2018-04-09 DIAGNOSIS — I429 Cardiomyopathy, unspecified: Secondary | ICD-10-CM | POA: Diagnosis not present

## 2018-04-09 NOTE — Progress Notes (Signed)
Diagnosis No diagnosis found.  Staging Cancer Staging No matching staging information was found for the patient.  Assessment and Plan:  1. Stage II right breast cancer diagnosed in 1987 and managed with right lumpectomy, axillary LN dissection resulting in 1 positive LN, followed by adjuvant XRT and Tamoxifen (treated at [facility]).  Pt had bilateral screening mammogram done 10/22/2017 that was reviewed and was negative.  She reports she has mammograms ordered by PCP Dr. Karie Kirks.  She should have mammograms done in 09/2018.  She will be seen for follow-up in 03/2019.  She is advised to notify the office if she has any problems prior to her next visit.    2.  Hodgkin lymphoma ([place]).  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  She denies any B symptoms.  No adenopathy palpable on exam.   3.  Thyroid carcinoma ([place]).  Thyroid carcinoma with history of near total thyroidectomy.  She reports she has labs monitored by PCP  4.  HTN.  BP is 115/70.  Follow-up with PCP.    5  Loss of husband.  I expressed sympathy to pt.    6.  Insomnia.  Pt reports she uses sleeping aids per PCP.  Follow-up with PCP for ongoing management.    Interval History:  Historical data obtained from note dated 04/03/2017:  84 yr old female with right breast cancer.  She was previously followed by Dr. Tressie Stalker for Stage II right breast cancer diagnosed in 1987 and managed with right lumpectomy, axillary LN dissection resulting in 1 positive LN, followed by adjuvant XRT and Tamoxifen (treated at [facility]).  Current Status:  Pt is seen today for follow-up.  She reports she has lost her husband.  She reports she continues to have mammograms ordered by PCP.    Problem List Patient Active Problem List   Diagnosis Date Noted  . History of right breast cancer [Z85.3] 04/03/2017  . Hodgkin lymphoma ([place]) [C81.90]  04/03/2017  . Thyroid carcinoma ([place]) [C73] 04/03/2017  . Peripheral arterial disease ([place]) [I73.9] 08/12/2014  . Essential hypertension [I10] 08/12/2014  . CAD (coronary artery disease), native coronary artery [I25.10] 02/16/2014  . Nonischemic cardiomyopathy ([place]) [I42.8] 02/16/2014    Past Medical History Past Medical History:  Diagnosis Date  . Asthma   . Breast cancer ([place])   . Coronary atherosclerosis    Cardiac catheterization 2004 with 60% mid LAD  . DVT (deep venous thrombosis) ([place])   . Essential hypertension   . History of breast cancer 1987   Right-sided, mastectomy with axillary node dissection and Adriamycin   . History of thyroid cancer   . Hodgkin lymphoma ([place]) 04/03/2017  . Hodgkin's disease ([place])   . Hypothyroidism   . Nonischemic cardiomyopathy (H[facility])    LVEF improved to 50-55% as of 2014  . Obesity   . Osteoporosis   . Peripheral arterial disease ([place])    Stent to the left common iliac in 2006  . Thyroid carcinoma ([place]) 04/03/2017    Past Surgical History Past Surgical History:  Procedure Laterality Date  . ABDOMINAL HYSTERECTOMY    . ANGIOPLASTY / STENTING FEMORAL Left 07/13/2005  . APPENDECTOMY    . BUNIONECTOMY Right   . CATARACT EXTRACTION W/PHACO  05/28/2012   Procedure: CATARACT EXTRACTION PHACO AND INTRAOCULAR LENS PLACEMENT (IOC);  Surgeon: Elta Guadeloupe T. Gershon Crane, MD;  Location: [facility];  Service: Ophthalmology;  Laterality: Right;  CDE 12.86  .  CATARACT EXTRACTION W/PHACO  06/04/2012   Procedure: CATARACT EXTRACTION PHACO AND INTRAOCULAR LENS PLACEMENT (IOC);  Surgeon: Elta Guadeloupe T. Gershon Crane, MD;  Location: [facility];  Service: Ophthalmology;  Laterality: Left;  CDE:15.32  . COLONOSCOPY    . MASTECTOMY PARTIAL / LUMPECTOMY W/ AXILLARY LYMPHADENECTOMY Right 1987  . THYROIDECTOMY    . TONSILLECTOMY    . YAG LASER APPLICATION Right 02/40/9735   Procedure: YAG LASER APPLICATION;  Surgeon: Rutherford Guys, MD;  Location: [facility];  Service: Ophthalmology;  Laterality:  Right;  . YAG LASER APPLICATION Left 11/25/9922   Procedure: YAG LASER APPLICATION;  Surgeon: Rutherford Guys, MD;  Location: [facility];  Service: Ophthalmology;  Laterality: Left;    Family History Family History  Problem Relation Age of Onset  . Lung cancer Mother   . Heart attack Father      Social History  reports that she has never smoked. She has never used smokeless tobacco. She reports that she does not drink alcohol or use drugs.  Medications  Current Outpatient Medications:  .  aspirin 81 MG tablet, Take 81 mg by mouth daily., Disp: , Rfl:  .  Calcium Carbonate-Vitamin D (CALCIUM 600 + D PO), Take 1 tablet by mouth daily., Disp: , Rfl:  .  diphenhydramine-acetaminophen (TYLENOL PM) 25-500 MG TABS, Take 1 tablet by mouth at bedtime as needed., Disp: , Rfl:  .  levothyroxine (SYNTHROID, LEVOTHROID) 100 MCG tablet, Take 100 mcg by mouth daily., Disp: , Rfl:  .  lisinopril (PRINIVIL,ZESTRIL) 10 MG tablet, Take 1 tablet (10 mg total) by mouth daily., Disp: 90 tablet, Rfl: 2 .  Multiple Vitamins-Calcium (ONE-A-DAY WOMENS PO), Take 1 tablet by mouth daily., Disp: , Rfl:  .  QVAR 80 MCG/ACT inhaler, , Disp: , Rfl:  .  simvastatin (ZOCOR) 40 MG tablet, Take 1 tablet (40 mg total) by mouth at bedtime., Disp: 90 tablet, Rfl: 2 .  spironolactone (ALDACTONE) 25 MG tablet, Take 0.5 tablets (12.5 mg total) by mouth daily., Disp: 45 tablet, Rfl: 2 .  temazepam (RESTORIL) 30 MG capsule, Take 30 mg by mouth at bedtime. , Disp: , Rfl:  .  vitamin C (ASCORBIC ACID) 500 MG tablet, Take 500 mg by mouth daily., Disp: , Rfl:  .  carvedilol (COREG) 3.125 MG tablet, Take 1 tablet (3.125 mg total) by mouth 2 (two) times daily., Disp: 180 tablet, Rfl: 3  Allergies Patient has no known allergies.  Review of Systems Review of Systems - Oncology ROS negative other than insomnia and respiratory    Physical Exam  Vitals Wt Readings from Last 3 Encounters:  04/09/18 199 lb 8 oz (90.5 kg)  10/10/17 199  lb (90.3 kg)  04/03/17 193 lb 14.4 oz (88 kg)   Temp Readings from Last 3 Encounters:  04/09/18 (!) 97.5 F (36.4 C) (Oral)  10/02/17 97.8 F (36.6 C) (Oral)  09/21/17 97.7 F (36.5 C) (Oral)   BP Readings from Last 3 Encounters:  04/09/18 115/70  10/10/17 124/72  10/02/17 (!) 159/80   Pulse Readings from Last 3 Encounters:  04/09/18 80  10/10/17 78  10/02/17 70   Constitutional: Well-developed, well-nourished, and in no distress.   HENT: Head: Normocephalic and atraumatic.  Mouth/Throat: No oropharyngeal exudate. Mucosa moist. Eyes: Pupils are equal, round, and reactive to light. Conjunctivae are normal. No scleral icterus.  Neck: Normal range of motion. Neck supple. No JVD present.  Cardiovascular: Normal rate, regular rhythm and normal heart sounds.  Exam reveals no gallop and no friction rub.  No murmur heard. Pulmonary/Chest: Effort normal and breath sounds normal. No respiratory distress. No wheezes.No rales.  Abdominal: Soft. Bowel sounds are normal. No distension. There is no tenderness. There is no guarding.  Musculoskeletal: No edema or tenderness.  Lymphadenopathy: No cervical, axillary or supraclavicular adenopathy.  Neurological: Alert and oriented to person, place, and time. No cranial nerve deficit.  Skin: Skin is warm and dry. No rash noted. No erythema. No pallor.  Psychiatric: Affect and judgment normal.  Bilateral breast exam:  Chaperone present.  Right lumpectomy changes noted.  No dominant masses palpable bilaterally.   Labs No visits with results within 3 Day(s) from this visit.  Latest known visit with results is:  Hospital Outpatient Visit on 04/04/2017  Component Date Value Ref Range Status  . LA vol 04/04/2017 37.2  mL Final  . Stroke v 04/04/2017 42  ml Final  . Reg peak vel 04/04/2017 211  cm/s Final  . LV e' LATERAL 04/04/2017 5.11  cm/s Final  . LV E/e' medial 04/04/2017 11.02   Final  . LV E/e'average 04/04/2017 11.02   Final  . LA vol  A4C 04/04/2017 33.7  ml Final  . E decel time 04/04/2017 285  msec Final  . LVOT diameter 04/04/2017 20  mm Final  . LVOT area 04/04/2017 3.14  cm2 Final  . E/e' ratio 04/04/2017 11.02   Final  . MV pk E vel 04/04/2017 56.3  m/s Final  . TR max vel 04/04/2017 211  cm/s Final  . MV pk A vel 04/04/2017 99.4  m/s Final  . LV sys vol 04/04/2017 85* 14 - 42 mL Final  . LV sys vol index 04/04/2017 40.0  mL/m2 Final  . LV dias vol 04/04/2017 126* 46 - 106 mL Final  . LV dias vol index 04/04/2017 60.0  mL/m2 Final  . LA vol index 04/04/2017 17.8  mL/m2 Final  . MV Dec 04/04/2017 285   Final  . Simpson's disk 04/04/2017 33.00   Final  . TDI e' medial 04/04/2017 4.03   Final  . TDI e' lateral 04/04/2017 5.11   Final  . TAPSE 04/04/2017 17.80  mm Final     Pathology No orders of the defined types were placed in this encounter.      Zoila Shutter MD

## 2018-04-09 NOTE — Patient Instructions (Signed)
[facility] Discharge Instructions  You saw Dr. Higgs today.   Thank you for choosing  Cancer Center at [facility] to provide your oncology and hematology care.  To afford each patient quality time with our provider, please arrive at least 15 minutes before your scheduled appointment time.   If you have a lab appointment with the Cancer Center please come in thru the  Main Entrance and check in at the main information desk  You need to re-schedule your appointment should you arrive 10 or more minutes late.  We strive to give you quality time with our providers, and arriving late affects you and other patients whose appointments are after yours.  Also, if you no show three or more times for appointments you may be dismissed from the clinic at the providers discretion.     Again, thank you for choosing [facility].  Our hope is that these requests will decrease the amount of time that you wait before being seen by our physicians.       _____________________________________________________________  Should you have questions after your visit to  Cancer Center, please contact our office at [phone] between the hours of 8:30 a.m. and 4:30 p.m.  Voicemails left after 4:30 p.m. will not be returned until the following business day.  For prescription refill requests, have your pharmacy contact our office.       Resources For Cancer Patients and their Caregivers ? American Cancer Society: Can assist with transportation, wigs, general needs, runs Look Good Feel Better.        1-888-227-6333 ? Cancer Care: Provides financial assistance, online support groups, medication/co-pay assistance.  1-800-813-HOPE (4673) ? [facility] Assists [place] cancer patients and their families through emotional , educational and financial support.  336-427-4357 ? [place] DSS Where to apply for food  stamps, Medicaid and utility assistance. 336-342-1394 ? [facility]: Transportation to medical appointments. 336-347-2287 ? Social Security Administration: May apply for disability if have a Stage IV cancer. 336-342-7796 1-800-772-1213 ? [place] Aging, Disability and Transit Services: Assists with nutrition, care and transit needs. 336-349-2343  Cancer Center Support Programs:   > Cancer Support Group  2nd Tuesday of the month 1pm-2pm, Journey Room   > Creative Journey  3rd Tuesday of the month 1130am-1pm, Journey Room     

## 2018-04-10 ENCOUNTER — Ambulatory Visit (HOSPITAL_COMMUNITY)
Admission: RE | Admit: 2018-04-10 | Discharge: 2018-04-10 | Disposition: A | Discharge status: Home / Self Care | Payer: Medicare HMO | Source: Ambulatory Visit | Attending: Cardiology | Admitting: Cardiology

## 2018-04-10 DIAGNOSIS — I517 Cardiomegaly: Secondary | ICD-10-CM | POA: Diagnosis not present

## 2018-04-10 DIAGNOSIS — I428 Other cardiomyopathies: Secondary | ICD-10-CM | POA: Insufficient documentation

## 2018-04-10 DIAGNOSIS — Z853 Personal history of malignant neoplasm of breast: Secondary | ICD-10-CM | POA: Insufficient documentation

## 2018-04-10 DIAGNOSIS — I340 Nonrheumatic mitral (valve) insufficiency: Secondary | ICD-10-CM | POA: Diagnosis not present

## 2018-04-10 DIAGNOSIS — I2510 Atherosclerotic heart disease of native coronary artery without angina pectoris: Secondary | ICD-10-CM | POA: Insufficient documentation

## 2018-04-10 DIAGNOSIS — Z8571 Personal history of Hodgkin lymphoma: Secondary | ICD-10-CM | POA: Insufficient documentation

## 2018-04-10 DIAGNOSIS — I739 Peripheral vascular disease, unspecified: Secondary | ICD-10-CM | POA: Diagnosis not present

## 2018-04-10 DIAGNOSIS — Z85850 Personal history of malignant neoplasm of thyroid: Secondary | ICD-10-CM | POA: Diagnosis not present

## 2018-04-10 NOTE — Progress Notes (Signed)
*  PRELIMINARY RESULTS* Echocardiogram 2D Echocardiogram has been performed.  Samuel Germany 04/10/2018, 9:35 AM

## 2018-04-12 ENCOUNTER — Telehealth: Payer: Self-pay | Admitting: *Deleted

## 2018-04-12 NOTE — Telephone Encounter (Signed)
Patient informed and copy sent to PCP. 

## 2018-04-12 NOTE — Telephone Encounter (Signed)
-----   Message from Satira Sark, MD sent at 04/12/2018  4:35 PM EDT ----- Results reviewed. LVEF up to approximately 40%, better than at last assessment. Keep scheduled follow-up and current medications. A copy of this test should be forwarded to Lemmie Evens, MD.

## 2018-04-15 ENCOUNTER — Encounter: Payer: Self-pay | Admitting: Cardiology

## 2018-04-15 ENCOUNTER — Ambulatory Visit: Payer: Medicare HMO | Admitting: Cardiology

## 2018-04-15 VITALS — BP 140/82 | HR 70 | Ht 69.0 in | Wt 199.8 lb

## 2018-04-15 DIAGNOSIS — I2510 Atherosclerotic heart disease of native coronary artery without angina pectoris: Secondary | ICD-10-CM

## 2018-04-15 DIAGNOSIS — I428 Other cardiomyopathies: Secondary | ICD-10-CM | POA: Diagnosis not present

## 2018-04-15 DIAGNOSIS — E782 Mixed hyperlipidemia: Secondary | ICD-10-CM | POA: Diagnosis not present

## 2018-04-15 DIAGNOSIS — I10 Essential (primary) hypertension: Secondary | ICD-10-CM | POA: Diagnosis not present

## 2018-04-15 MED ORDER — LISINOPRIL 5 MG PO TABS: 5.0000 mg | ORAL_TABLET | Freq: Every day | ORAL | 3 refills | Status: DC

## 2018-04-15 NOTE — Progress Notes (Signed)
Cardiology Office Note  Date: 04/15/2018   ID: [name], DOB Oct 03, 1929, MRN [id number]  PCP: Christine Evens, MD  Primary Cardiologist: Christine Lesches, MD   Chief Complaint  Patient presents with  . Cardiomyopathy    History of Present Illness: [name] is an 84 y.o. female last seen in January.  She presents for a routine follow-up visit.  Reports no change in stamina, NYHA class II dyspnea, no palpitations or syncope.  She remains functional with ADLs, even gets outside in the very early morning in the garden.  Recent follow-up echocardiogram showed some improvement in LVEF to approximately 40% with diffuse hypokinesis and grade 1 diastolic dysfunction.  We went over the results today.  Current cardiac regimen as outlined below.  She reports no intolerances.  I personally reviewed her ECG today which shows sinus rhythm with nonspecific T wave changes.  Past Medical History:  Diagnosis Date  . Asthma   . Breast cancer ([place])   . Coronary atherosclerosis    Cardiac catheterization 2004 with 60% mid LAD  . DVT (deep venous thrombosis) ([place])   . Essential hypertension   . History of breast cancer 1987   Right-sided, mastectomy with axillary node dissection and Adriamycin   . History of thyroid cancer   . Hodgkin lymphoma ([place]) 04/03/2017  . Hodgkin's disease ([place])   . Hypothyroidism   . Nonischemic cardiomyopathy ([facility])    LVEF improved to 50-55% as of 2014  . Obesity   . Osteoporosis   . Peripheral arterial disease ([place])    Stent to the left common iliac in 2006  . Thyroid carcinoma ([place]) 04/03/2017    Past Surgical History:  Procedure Laterality Date  . ABDOMINAL HYSTERECTOMY    . ANGIOPLASTY / STENTING FEMORAL Left 07/13/2005  . APPENDECTOMY    . BUNIONECTOMY Right   . CATARACT EXTRACTION W/PHACO  05/28/2012   Procedure: CATARACT EXTRACTION PHACO AND INTRAOCULAR LENS PLACEMENT (IOC);  Surgeon: Elta Guadeloupe T. Gershon Crane, MD;  Location: [facility];  Service:  Ophthalmology;  Laterality: Right;  CDE 12.86  . CATARACT EXTRACTION W/PHACO  06/04/2012   Procedure: CATARACT EXTRACTION PHACO AND INTRAOCULAR LENS PLACEMENT (IOC);  Surgeon: Elta Guadeloupe T. Gershon Crane, MD;  Location: [facility];  Service: Ophthalmology;  Laterality: Left;  CDE:15.32  . COLONOSCOPY    . MASTECTOMY PARTIAL / LUMPECTOMY W/ AXILLARY LYMPHADENECTOMY Right 1987  . THYROIDECTOMY    . TONSILLECTOMY    . YAG LASER APPLICATION Right 61/44/3154   Procedure: YAG LASER APPLICATION;  Surgeon: Rutherford Guys, MD;  Location: [facility];  Service: Ophthalmology;  Laterality: Right;  . YAG LASER APPLICATION Left 0/0/8676   Procedure: YAG LASER APPLICATION;  Surgeon: Rutherford Guys, MD;  Location: [facility];  Service: Ophthalmology;  Laterality: Left;    Current Outpatient Medications  Medication Sig Dispense Refill  . aspirin 81 MG tablet Take 81 mg by mouth daily.    . Calcium Carbonate-Vitamin D (CALCIUM 600 + D PO) Take 1 tablet by mouth daily.    . diphenhydramine-acetaminophen (TYLENOL PM) 25-500 MG TABS Take 1 tablet by mouth at bedtime as needed.    Marland Kitchen levothyroxine (SYNTHROID, LEVOTHROID) 100 MCG tablet Take 100 mcg by mouth daily.    Marland Kitchen lisinopril (PRINIVIL,ZESTRIL) 10 MG tablet Take 1 tablet (10 mg total) by mouth daily. 90 tablet 2  . Multiple Vitamins-Calcium (ONE-A-DAY WOMENS PO) Take 1 tablet by mouth daily.    Marland Kitchen QVAR 80 MCG/ACT inhaler     . simvastatin (ZOCOR) 40  MG tablet Take 1 tablet (40 mg total) by mouth at bedtime. 90 tablet 2  . temazepam (RESTORIL) 30 MG capsule Take 30 mg by mouth at bedtime.     . vitamin C (ASCORBIC ACID) 500 MG tablet Take 500 mg by mouth daily.    . carvedilol (COREG) 3.125 MG tablet Take 1 tablet (3.125 mg total) by mouth 2 (two) times daily. 180 tablet 3  . spironolactone (ALDACTONE) 25 MG tablet Take 0.5 tablets (12.5 mg total) by mouth daily. (Patient not taking: Reported on 04/15/2018) 45 tablet 2   No current facility-administered medications for this visit.     Allergies:  Patient has no known allergies.   Social History: The patient  reports that she has never smoked. She has never used smokeless tobacco. She reports that she does not drink alcohol or use drugs.   ROS:  Please see the history of present illness. Otherwise, complete review of systems is positive for none.  All other systems are reviewed and negative.   Physical Exam: VS:  BP 140/82 (BP Location: Left Arm, Patient Position: Sitting)   Pulse 70   Ht 5\' 9"  (1.753 m)   Wt 199 lb 12.8 oz (90.6 kg)   BMI 29.51 kg/m , BMI Body mass index is 29.51 kg/m.  Wt Readings from Last 3 Encounters:  04/15/18 199 lb 12.8 oz (90.6 kg)  04/09/18 199 lb 8 oz (90.5 kg)  10/10/17 199 lb (90.3 kg)    General: Elderly woman, appears comfortable at rest. HEENT: Conjunctiva and lids normal, oropharynx clear. Neck: Supple, no elevated JVP or carotid bruits, no thyromegaly. Lungs: Clear to auscultation, nonlabored breathing at rest. Cardiac: Regular rate and rhythm, no S3, soft systolic murmur. Abdomen: Soft, nontender, bowel sounds present. Extremities: No pitting edema, distal pulses 2+. Skin: Warm and dry. Musculoskeletal: No kyphosis. Neuropsychiatric: Alert and oriented x3, affect grossly appropriate.  ECG: I personally reviewed the tracing from 04/02/2017 which showed sinus rhythm with decreased R wave progression and nonspecific T-wave changes.  Recent Labwork:  July 2017: Potassium 4.2, BUN 34, creatinine 1.4, AST 25, ALT 21, hemoglobin 12.0, platelets 267  Other Studies Reviewed Today:  Echocardiogram 04/10/2018: Study Conclusions  - Left ventricle: The cavity size was normal. Wall thickness was   increased increased in a pattern of mild to moderate LVH.   Systolic function was mildly to moderately reduced. The estimated   ejection fraction was = 40%. Diffuse hypokinesis. Doppler   parameters are consistent with abnormal left ventricular   relaxation (grade 1 diastolic  dysfunction). Doppler parameters   are consistent with high ventricular filling pressure. - Aortic valve: Valve area (VTI): 2.7 cm^2. Valve area (Vmax): 2.51   cm^2. Valve area (Vmean): 2.47 cm^2. - Mitral valve: Mildly calcified annulus. Mildly thickened leaflets   . There was moderate regurgitation. - Left atrium: The atrium was moderately dilated. - Atrial septum: No defect or patent foramen ovale was identified. - Pulmonary arteries: Systolic pressure was mildly increased. PA   peak pressure: 32 mm Hg (S). - Technically adequate study.  Assessment and Plan:  1.  Nonischemic cardiomyopathy, LVEF has improved to approximately 40% on current medical therapy.  She remains symptomatically stable, no fluid overload/change in weight.  Continue with current regimen.  2.  CAD with moderate LAD disease based on previous evaluation, no active angina symptoms.  She remains on aspirin and statin.  3.  Essential hypertension, continue to follow with Dr. Karie Kirks.  4.  Mixed hyperlipidemia, on Zocor.  Current medicines were reviewed with the patient today.   Orders Placed This Encounter  Procedures  . EKG 12-Lead    Disposition: Follow-up in 6 months, sooner if needed.  Signed, Satira Sark, MD, [facility] 04/15/2018 2:16 PM    [facility] at [facility] [address], [place], [place] [address] Phone: [phone]; Fax: [phone]

## 2018-04-15 NOTE — Addendum Note (Signed)
Addended by: Barbarann Ehlers A on: 04/15/2018 02:32 PM   Modules accepted: Orders

## 2018-04-15 NOTE — Patient Instructions (Signed)
Your physician wants you to follow-up in: 6 months with Dr.McDowell You will receive a reminder letter in the mail two months in advance. If you don't receive a letter, please call our office to schedule the follow-up appointment.  Call back with dose of cholesterol medication (if you are taking it)   Your physician recommends that you continue on your current medications as directed. Please refer to the Current Medication list given to you today.    If you need a refill on your cardiac medications before your next appointment, please call your pharmacy.       No labs or tests ordered today.       Thank you for choosing [facility] !

## 2018-07-23 ENCOUNTER — Encounter (HOSPITAL_COMMUNITY): Payer: Self-pay | Admitting: Emergency Medicine

## 2018-07-23 ENCOUNTER — Emergency Department (HOSPITAL_COMMUNITY): Payer: Medicare HMO

## 2018-07-23 ENCOUNTER — Other Ambulatory Visit: Payer: Self-pay

## 2018-07-23 ENCOUNTER — Emergency Department (HOSPITAL_COMMUNITY)
Admission: EM | Admit: 2018-07-23 | Discharge: 2018-07-23 | Disposition: A | Discharge status: Home / Self Care | Payer: Medicare HMO | Attending: Emergency Medicine | Admitting: Emergency Medicine

## 2018-07-23 DIAGNOSIS — E039 Hypothyroidism, unspecified: Secondary | ICD-10-CM | POA: Diagnosis not present

## 2018-07-23 DIAGNOSIS — J441 Chronic obstructive pulmonary disease with (acute) exacerbation: Secondary | ICD-10-CM | POA: Diagnosis not present

## 2018-07-23 DIAGNOSIS — Z853 Personal history of malignant neoplasm of breast: Secondary | ICD-10-CM | POA: Diagnosis not present

## 2018-07-23 DIAGNOSIS — I10 Essential (primary) hypertension: Secondary | ICD-10-CM | POA: Diagnosis not present

## 2018-07-23 DIAGNOSIS — Z79899 Other long term (current) drug therapy: Secondary | ICD-10-CM | POA: Diagnosis not present

## 2018-07-23 DIAGNOSIS — I2510 Atherosclerotic heart disease of native coronary artery without angina pectoris: Secondary | ICD-10-CM | POA: Insufficient documentation

## 2018-07-23 DIAGNOSIS — R0600 Dyspnea, unspecified: Secondary | ICD-10-CM | POA: Diagnosis present

## 2018-07-23 DIAGNOSIS — Z7982 Long term (current) use of aspirin: Secondary | ICD-10-CM | POA: Insufficient documentation

## 2018-07-23 LAB — CBC WITH DIFFERENTIAL/PLATELET
ABS IMMATURE GRANULOCYTES: 0.04 10*3/uL (ref 0.00–0.07)
Basophils Absolute: 0 10*3/uL (ref 0.0–0.1)
Basophils Relative: 1 %
Eosinophils Absolute: 0.1 10*3/uL (ref 0.0–0.5)
Eosinophils Relative: 1 %
HEMATOCRIT: 39.1 % (ref 36.0–46.0)
Hemoglobin: 11.8 g/dL — ABNORMAL LOW (ref 12.0–15.0)
IMMATURE GRANULOCYTES: 1 %
LYMPHS ABS: 2.2 10*3/uL (ref 0.7–4.0)
Lymphocytes Relative: 25 %
MCH: 27.3 pg (ref 26.0–34.0)
MCHC: 30.2 g/dL (ref 30.0–36.0)
MCV: 90.5 fL (ref 80.0–100.0)
MONO ABS: 0.8 10*3/uL (ref 0.1–1.0)
MONOS PCT: 9 %
NEUTROS ABS: 5.5 10*3/uL (ref 1.7–7.7)
NEUTROS PCT: 63 %
Platelets: 185 10*3/uL (ref 150–400)
RBC: 4.32 MIL/uL (ref 3.87–5.11)
RDW: 15.5 % (ref 11.5–15.5)
WBC: 8.7 10*3/uL (ref 4.0–10.5)
nRBC: 0 % (ref 0.0–0.2)

## 2018-07-23 LAB — I-STAT CHEM 8, ED
BUN: 32 mg/dL — AB (ref 8–23)
CALCIUM ION: 0.88 mmol/L — AB (ref 1.15–1.40)
CHLORIDE: 105 mmol/L (ref 98–111)
CREATININE: 1.9 mg/dL — AB (ref 0.44–1.00)
Glucose, Bld: 176 mg/dL — ABNORMAL HIGH (ref 70–99)
HCT: 42 % (ref 36.0–46.0)
Hemoglobin: 14.3 g/dL (ref 12.0–15.0)
Potassium: 4.9 mmol/L (ref 3.5–5.1)
Sodium: 138 mmol/L (ref 135–145)
TCO2: 23 mmol/L (ref 22–32)

## 2018-07-23 LAB — BASIC METABOLIC PANEL
ANION GAP: 10 (ref 5–15)
BUN: 29 mg/dL — ABNORMAL HIGH (ref 8–23)
CHLORIDE: 104 mmol/L (ref 98–111)
CO2: 24 mmol/L (ref 22–32)
CREATININE: 1.73 mg/dL — AB (ref 0.44–1.00)
Calcium: 7.4 mg/dL — ABNORMAL LOW (ref 8.9–10.3)
GFR calc non Af Amer: 26 mL/min — ABNORMAL LOW (ref >60)
GFR, EST AFRICAN AMERICAN: 30 mL/min — AB (ref >60)
Glucose, Bld: 180 mg/dL — ABNORMAL HIGH (ref 70–99)
POTASSIUM: 4.7 mmol/L (ref 3.5–5.1)
Sodium: 138 mmol/L (ref 135–145)

## 2018-07-23 LAB — HEPATIC FUNCTION PANEL
ALBUMIN: 3.8 g/dL (ref 3.5–5.0)
ALT: 21 U/L (ref 0–44)
AST: 28 U/L (ref 15–41)
Alkaline Phosphatase: 64 U/L (ref 38–126)
BILIRUBIN TOTAL: 1 mg/dL (ref 0.3–1.2)
Bilirubin, Direct: 0.1 mg/dL (ref 0.0–0.2)
Indirect Bilirubin: 0.9 mg/dL (ref 0.3–0.9)
TOTAL PROTEIN: 7.9 g/dL (ref 6.5–8.1)

## 2018-07-23 LAB — I-STAT TROPONIN, ED: Troponin i, poc: 0.07 ng/mL (ref 0.00–0.08)

## 2018-07-23 LAB — BRAIN NATRIURETIC PEPTIDE: B NATRIURETIC PEPTIDE 5: 681 pg/mL — AB (ref 0.0–100.0)

## 2018-07-23 MED ORDER — METHYLPREDNISOLONE SODIUM SUCC 125 MG IJ SOLR
125.0000 mg | Freq: Once | INTRAMUSCULAR | Status: AC
  Administered 2018-07-23: 125 mg via INTRAVENOUS
  Filled 2018-07-23: qty 2

## 2018-07-23 MED ORDER — FUROSEMIDE 20 MG PO TABS: 20.0000 mg | ORAL_TABLET | Freq: Every day | ORAL | 0 refills | Status: DC

## 2018-07-23 MED ORDER — PREDNISONE 10 MG PO TABS: 20.0000 mg | ORAL_TABLET | Freq: Every day | ORAL | 0 refills | Status: DC

## 2018-07-23 MED ORDER — IPRATROPIUM-ALBUTEROL 0.5-2.5 (3) MG/3ML IN SOLN
3.0000 mL | Freq: Once | RESPIRATORY_TRACT | Status: AC
  Administered 2018-07-23: 3 mL via RESPIRATORY_TRACT
  Filled 2018-07-23: qty 3

## 2018-07-23 MED ORDER — MAGNESIUM SULFATE 2 GM/50ML IV SOLN
2.0000 g | Freq: Once | INTRAVENOUS | Status: AC
  Administered 2018-07-23: 2 g via INTRAVENOUS
  Filled 2018-07-23: qty 50

## 2018-07-23 MED ORDER — ALBUTEROL SULFATE (2.5 MG/3ML) 0.083% IN NEBU
2.5000 mg | INHALATION_SOLUTION | Freq: Once | RESPIRATORY_TRACT | Status: AC
  Administered 2018-07-23: 2.5 mg via RESPIRATORY_TRACT
  Filled 2018-07-23: qty 3

## 2018-07-23 MED ORDER — FUROSEMIDE 10 MG/ML IJ SOLN
20.0000 mg | Freq: Once | INTRAMUSCULAR | Status: AC
  Administered 2018-07-23: 20 mg via INTRAVENOUS
  Filled 2018-07-23: qty 2

## 2018-07-23 NOTE — ED Provider Notes (Signed)
[facility] EMERGENCY DEPARTMENT Provider Note   CSN: [id number] Arrival date & time: 07/23/18  1347     History   Chief Complaint Chief Complaint  Patient presents with  . Respiratory Distress    HPI [name] is a 84 y.o. female.  Patient complains of shortness of breath.  Patient has a history of asthma.  The history is provided by the patient. No language interpreter was used.  Shortness of Breath  This is a new problem. The current episode started yesterday. The problem has not changed since onset.Associated symptoms include wheezing. Pertinent negatives include no fever, no headaches, no cough, no chest pain, no abdominal pain and no rash. It is unknown what precipitated the problem. Risk factors: Asthma. She has tried ipratropium inhalers for the symptoms. The treatment provided no relief.    Past Medical History:  Diagnosis Date  . Asthma   . Breast cancer ([place])   . Coronary atherosclerosis    Cardiac catheterization 2004 with 60% mid LAD  . DVT (deep venous thrombosis) ([place])   . Essential hypertension   . History of breast cancer 1987   Right-sided, mastectomy with axillary node dissection and Adriamycin   . History of thyroid cancer   . Hodgkin lymphoma ([place]) 04/03/2017  . Hodgkin's disease ([place])   . Hypothyroidism   . Nonischemic cardiomyopathy (HCC)    LVEF improved to 50-55% as of 2014  . Obesity   . Osteoporosis   . Peripheral arterial disease ([place])    Stent to the left common iliac in 2006  . Thyroid carcinoma ([place]) 04/03/2017    Patient Active Problem List   Diagnosis Date Noted  . History of right breast cancer 04/03/2017  . Hodgkin lymphoma ([place]) 04/03/2017  . Thyroid carcinoma ([place]) 04/03/2017  . Peripheral arterial disease ([place]) 08/12/2014  . Essential hypertension 08/12/2014  . CAD (coronary artery disease), native coronary artery 02/16/2014  . Nonischemic cardiomyopathy ([place]) 02/16/2014    Past Surgical History:  Procedure  Laterality Date  . ABDOMINAL HYSTERECTOMY    . ANGIOPLASTY / STENTING FEMORAL Left 07/13/2005  . APPENDECTOMY    . BUNIONECTOMY Right   . CATARACT EXTRACTION W/PHACO  05/28/2012   Procedure: CATARACT EXTRACTION PHACO AND INTRAOCULAR LENS PLACEMENT (IOC);  Surgeon: Elta Guadeloupe T. Gershon Crane, MD;  Location: [facility];  Service: Ophthalmology;  Laterality: Right;  CDE 12.86  . CATARACT EXTRACTION W/PHACO  06/04/2012   Procedure: CATARACT EXTRACTION PHACO AND INTRAOCULAR LENS PLACEMENT (IOC);  Surgeon: Elta Guadeloupe T. Gershon Crane, MD;  Location: [facility];  Service: Ophthalmology;  Laterality: Left;  CDE:15.32  . COLONOSCOPY    . MASTECTOMY PARTIAL / LUMPECTOMY W/ AXILLARY LYMPHADENECTOMY Right 1987  . THYROIDECTOMY    . TONSILLECTOMY    . YAG LASER APPLICATION Right 78/46/9629   Procedure: YAG LASER APPLICATION;  Surgeon: Rutherford Guys, MD;  Location: [facility];  Service: Ophthalmology;  Laterality: Right;  . YAG LASER APPLICATION Left 01/25/8412   Procedure: YAG LASER APPLICATION;  Surgeon: Rutherford Guys, MD;  Location: [facility];  Service: Ophthalmology;  Laterality: Left;     OB History   None      Home Medications    Prior to Admission medications   Medication Sig Start Date End Date Taking? Authorizing Provider  aspirin 81 MG tablet Take 81 mg by mouth daily.    [provider]  Calcium Carbonate-Vitamin D (CALCIUM 600 + D PO) Take 1 tablet by mouth daily.    [provider]  carvedilol (COREG) 3.125  MG tablet Take 1 tablet (3.125 mg total) by mouth 2 (two) times daily. 04/09/17 10/10/17  Satira Sark, MD  diphenhydramine-acetaminophen (TYLENOL PM) 25-500 MG TABS Take 1 tablet by mouth at bedtime as needed.    [provider]  furosemide (LASIX) 20 MG tablet Take 1 tablet (20 mg total) by mouth daily. 07/23/18   Milton Ferguson, MD  levothyroxine (SYNTHROID, LEVOTHROID) 100 MCG tablet Take 100 mcg by mouth daily.    [provider]  lisinopril (PRINIVIL,ZESTRIL) 5 MG tablet Take  1 tablet (5 mg total) by mouth daily. 04/15/18   Satira Sark, MD  Multiple Vitamins-Calcium (ONE-A-DAY WOMENS PO) Take 1 tablet by mouth daily.    [provider]  predniSONE (DELTASONE) 10 MG tablet Take 2 tablets (20 mg total) by mouth daily. 07/23/18   Milton Ferguson, MD  QVAR 80 MCG/ACT inhaler  01/10/16   [provider]  simvastatin (ZOCOR) 40 MG tablet Take 1 tablet (40 mg total) by mouth at bedtime. 04/09/17   Satira Sark, MD  spironolactone (ALDACTONE) 25 MG tablet Take 0.5 tablets (12.5 mg total) by mouth daily. Patient not taking: Reported on 04/15/2018 04/09/17   Satira Sark, MD  temazepam (RESTORIL) 30 MG capsule Take 30 mg by mouth at bedtime.  12/24/14   [provider]  vitamin C (ASCORBIC ACID) 500 MG tablet Take 500 mg by mouth daily.    [provider]    Family History Family History  Problem Relation Age of Onset  . Lung cancer Mother   . Heart attack Father     Social History Social History   Tobacco Use  . Smoking status: Never Smoker  . Smokeless tobacco: Never Used  Substance Use Topics  . Alcohol use: No  . Drug use: No     Allergies   Patient has no known allergies.   Review of Systems Review of Systems  Constitutional: Negative for appetite change, fatigue and fever.  HENT: Negative for congestion, ear discharge and sinus pressure.   Eyes: Negative for discharge.  Respiratory: Positive for shortness of breath and wheezing. Negative for cough.   Cardiovascular: Negative for chest pain.  Gastrointestinal: Negative for abdominal pain and diarrhea.  Genitourinary: Negative for frequency and hematuria.  Musculoskeletal: Negative for back pain.  Skin: Negative for rash.  Neurological: Negative for seizures and headaches.  Psychiatric/Behavioral: Negative for hallucinations.     Physical Exam Updated Vital Signs BP (!) 125/94   Pulse (!) 57   Temp (!) 97.5 F (36.4 C) (Oral)   Resp 19   Ht  5\' 9"  (1.753 m)   Wt 87.5 kg   SpO2 (!) 75%   BMI 28.50 kg/m   Physical Exam   ED Treatments / Results  Labs (all labs ordered are listed, but only abnormal results are displayed) Labs Reviewed  CBC WITH DIFFERENTIAL/PLATELET - Abnormal; Notable for the following components:      Result Value   Hemoglobin 11.8 (*)    All other components within normal limits  BRAIN NATRIURETIC PEPTIDE - Abnormal; Notable for the following components:   B Natriuretic Peptide 681.0 (*)    All other components within normal limits  BASIC METABOLIC PANEL - Abnormal; Notable for the following components:   Glucose, Bld 180 (*)    BUN 29 (*)    Creatinine, Ser 1.73 (*)    Calcium 7.4 (*)    GFR calc non Af Amer 26 (*)    GFR calc  Af Amer 30 (*)    All other components within normal limits  I-STAT CHEM 8, ED - Abnormal; Notable for the following components:   BUN 32 (*)    Creatinine, Ser 1.90 (*)    Glucose, Bld 176 (*)    Calcium, Ion 0.88 (*)    All other components within normal limits  HEPATIC FUNCTION PANEL  I-STAT TROPONIN, ED    EKG None  Radiology Dg Chest Portable 1 View  Result Date: 07/23/2018 CLINICAL DATA:  Respiratory distress EXAM: PORTABLE CHEST 1 VIEW COMPARISON:  11/11/2012 FINDINGS: Cardiac shadow is enlarged. Aortic calcifications are again noted. Postsurgical changes in the left axilla seen. No focal infiltrate or sizable effusion is noted. No bony abnormality is noted. IMPRESSION: No acute abnormality seen. Electronically Signed   By: Inez Catalina M.D.   On: 07/23/2018 14:15    Procedures Procedures (including critical care time)  Medications Ordered in ED Medications  magnesium sulfate IVPB 2 g 50 mL (2 g Intravenous New Bag/Given 07/23/18 1516)  furosemide (LASIX) injection 20 mg (has no administration in time range)  ipratropium-albuterol (DUONEB) 0.5-2.5 (3) MG/3ML nebulizer solution 3 mL (3 mLs Nebulization Given 07/23/18 1413)  albuterol (PROVENTIL) (2.5  MG/3ML) 0.083% nebulizer solution 2.5 mg (2.5 mg Nebulization Given 07/23/18 1413)  methylPREDNISolone sodium succinate (SOLU-MEDROL) 125 mg/2 mL injection 125 mg (125 mg Intravenous Given 07/23/18 1510)     Initial Impression / Assessment and Plan / ED Course  I have reviewed the triage vital signs and the nursing notes.  Pertinent labs & imaging results that were available during my care of the patient were reviewed by me and considered in my medical decision making (see chart for details).     Patient improved with neb treatments.  Suspect exacerbation of asthma.  Patient will be placed on prednisone.  Her BNP was slightly elevated and she has cardiomyopathy so she will be put on a small amount of Lasix.  O2 sats were 95% on room air at discharge  Final Clinical Impressions(s) / ED Diagnoses   Final diagnoses:  COPD exacerbation [facility])    ED Discharge Orders         Ordered    predniSONE (DELTASONE) 10 MG tablet  Daily     07/23/18 1520    furosemide (LASIX) 20 MG tablet  Daily     07/23/18 1520           Milton Ferguson, MD 07/23/18 1525

## 2018-07-23 NOTE — ED Triage Notes (Signed)
Patient brought over by Dr Karie Kirks with complaint of severe respiratory distress starting yesterday. Patient states "I wasn't feeling well Sunday but I still went to workout for two hours." Patient tachypnic and breathing labored at triage. Denies chest pain.

## 2018-07-23 NOTE — Discharge Instructions (Addendum)
Use your inhalers as needed.  Follow-up with Dr. Milon Score later this week for recheck

## 2018-07-23 NOTE — ED Notes (Signed)
Date and time results received: 07/23/18 1405 (use smartphrase ".now" to insert current time)  Test: Troponin Critical Value: 0.07  Name of Provider Notified: Dr Roderic Palau Orders Received? Or Actions Taken?: NA

## 2018-07-26 ENCOUNTER — Other Ambulatory Visit (HOSPITAL_COMMUNITY)
Admission: RE | Admit: 2018-07-26 | Discharge: 2018-07-26 | Disposition: A | Discharge status: Home / Self Care | Payer: Medicare HMO | Source: Ambulatory Visit | Attending: Family Medicine | Admitting: Family Medicine

## 2018-07-26 ENCOUNTER — Ambulatory Visit (HOSPITAL_COMMUNITY)
Admission: RE | Admit: 2018-07-26 | Discharge: 2018-07-26 | Disposition: A | Discharge status: Home / Self Care | Payer: Medicare HMO | Source: Ambulatory Visit | Attending: Family Medicine | Admitting: Family Medicine

## 2018-07-26 ENCOUNTER — Other Ambulatory Visit (HOSPITAL_COMMUNITY): Payer: Self-pay | Admitting: Family Medicine

## 2018-07-26 DIAGNOSIS — R69 Illness, unspecified: Secondary | ICD-10-CM | POA: Insufficient documentation

## 2018-07-26 DIAGNOSIS — I700 Atherosclerosis of aorta: Secondary | ICD-10-CM | POA: Insufficient documentation

## 2018-07-26 DIAGNOSIS — R0603 Acute respiratory distress: Secondary | ICD-10-CM | POA: Insufficient documentation

## 2018-07-26 DIAGNOSIS — I313 Pericardial effusion (noninflammatory): Secondary | ICD-10-CM

## 2018-07-26 DIAGNOSIS — I2510 Atherosclerotic heart disease of native coronary artery without angina pectoris: Secondary | ICD-10-CM

## 2018-07-26 DIAGNOSIS — R918 Other nonspecific abnormal finding of lung field: Secondary | ICD-10-CM

## 2018-07-26 DIAGNOSIS — I214 Non-ST elevation (NSTEMI) myocardial infarction: Secondary | ICD-10-CM | POA: Diagnosis not present

## 2018-07-26 LAB — BASIC METABOLIC PANEL
ANION GAP: 13 (ref 5–15)
BUN: 47 mg/dL — ABNORMAL HIGH (ref 8–23)
CO2: 21 mmol/L — ABNORMAL LOW (ref 22–32)
Calcium: 6.6 mg/dL — ABNORMAL LOW (ref 8.9–10.3)
Chloride: 104 mmol/L (ref 98–111)
Creatinine, Ser: 2.22 mg/dL — ABNORMAL HIGH (ref 0.44–1.00)
GFR calc Af Amer: 22 mL/min — ABNORMAL LOW (ref >60)
GFR, EST NON AFRICAN AMERICAN: 19 mL/min — AB (ref >60)
Glucose, Bld: 132 mg/dL — ABNORMAL HIGH (ref 70–99)
POTASSIUM: 4.5 mmol/L (ref 3.5–5.1)
SODIUM: 138 mmol/L (ref 135–145)

## 2018-07-26 LAB — BRAIN NATRIURETIC PEPTIDE: B NATRIURETIC PEPTIDE 5: 1074 pg/mL — AB (ref 0.0–100.0)

## 2018-07-26 LAB — CBC
HCT: 39.7 % (ref 36.0–46.0)
Hemoglobin: 12.1 g/dL (ref 12.0–15.0)
MCH: 27.2 pg (ref 26.0–34.0)
MCHC: 30.5 g/dL (ref 30.0–36.0)
MCV: 89.2 fL (ref 80.0–100.0)
PLATELETS: 164 10*3/uL (ref 150–400)
RBC: 4.45 MIL/uL (ref 3.87–5.11)
RDW: 15.4 % (ref 11.5–15.5)
WBC: 8.7 10*3/uL (ref 4.0–10.5)
nRBC: 0.3 % — ABNORMAL HIGH (ref 0.0–0.2)

## 2018-07-27 ENCOUNTER — Other Ambulatory Visit: Payer: Self-pay

## 2018-07-27 ENCOUNTER — Emergency Department (HOSPITAL_COMMUNITY): Payer: Medicare HMO

## 2018-07-27 ENCOUNTER — Encounter (HOSPITAL_COMMUNITY): Payer: Self-pay

## 2018-07-27 ENCOUNTER — Inpatient Hospital Stay (HOSPITAL_COMMUNITY)
Admission: EM | Admit: 2018-07-27 | Discharge: 2018-08-01 | DRG: 281 | Disposition: A | Discharge status: Home / Self Care | Payer: Medicare HMO | Attending: Cardiology | Admitting: Cardiology

## 2018-07-27 DIAGNOSIS — E669 Obesity, unspecified: Secondary | ICD-10-CM | POA: Diagnosis not present

## 2018-07-27 DIAGNOSIS — I5042 Chronic combined systolic (congestive) and diastolic (congestive) heart failure: Secondary | ICD-10-CM | POA: Diagnosis not present

## 2018-07-27 DIAGNOSIS — J45909 Unspecified asthma, uncomplicated: Secondary | ICD-10-CM | POA: Diagnosis not present

## 2018-07-27 DIAGNOSIS — Z7952 Long term (current) use of systemic steroids: Secondary | ICD-10-CM

## 2018-07-27 DIAGNOSIS — I739 Peripheral vascular disease, unspecified: Secondary | ICD-10-CM | POA: Diagnosis not present

## 2018-07-27 DIAGNOSIS — I428 Other cardiomyopathies: Secondary | ICD-10-CM | POA: Diagnosis present

## 2018-07-27 DIAGNOSIS — Z6827 Body mass index (BMI) 27.0-27.9, adult: Secondary | ICD-10-CM

## 2018-07-27 DIAGNOSIS — Z86718 Personal history of other venous thrombosis and embolism: Secondary | ICD-10-CM

## 2018-07-27 DIAGNOSIS — I700 Atherosclerosis of aorta: Secondary | ICD-10-CM | POA: Diagnosis present

## 2018-07-27 DIAGNOSIS — Z8571 Personal history of Hodgkin lymphoma: Secondary | ICD-10-CM

## 2018-07-27 DIAGNOSIS — Z9089 Acquired absence of other organs: Secondary | ICD-10-CM

## 2018-07-27 DIAGNOSIS — E785 Hyperlipidemia, unspecified: Secondary | ICD-10-CM | POA: Diagnosis present

## 2018-07-27 DIAGNOSIS — Z85850 Personal history of malignant neoplasm of thyroid: Secondary | ICD-10-CM

## 2018-07-27 DIAGNOSIS — M810 Age-related osteoporosis without current pathological fracture: Secondary | ICD-10-CM | POA: Diagnosis not present

## 2018-07-27 DIAGNOSIS — I130 Hypertensive heart and chronic kidney disease with heart failure and stage 1 through stage 4 chronic kidney disease, or unspecified chronic kidney disease: Secondary | ICD-10-CM | POA: Diagnosis not present

## 2018-07-27 DIAGNOSIS — N179 Acute kidney failure, unspecified: Secondary | ICD-10-CM | POA: Diagnosis not present

## 2018-07-27 DIAGNOSIS — Z90710 Acquired absence of both cervix and uterus: Secondary | ICD-10-CM

## 2018-07-27 DIAGNOSIS — N189 Chronic kidney disease, unspecified: Secondary | ICD-10-CM | POA: Diagnosis not present

## 2018-07-27 DIAGNOSIS — Z853 Personal history of malignant neoplasm of breast: Secondary | ICD-10-CM

## 2018-07-27 DIAGNOSIS — I214 Non-ST elevation (NSTEMI) myocardial infarction: Principal | ICD-10-CM | POA: Diagnosis present

## 2018-07-27 DIAGNOSIS — Z79899 Other long term (current) drug therapy: Secondary | ICD-10-CM

## 2018-07-27 DIAGNOSIS — Z79890 Hormone replacement therapy (postmenopausal): Secondary | ICD-10-CM

## 2018-07-27 DIAGNOSIS — Z7982 Long term (current) use of aspirin: Secondary | ICD-10-CM | POA: Diagnosis not present

## 2018-07-27 DIAGNOSIS — Z8249 Family history of ischemic heart disease and other diseases of the circulatory system: Secondary | ICD-10-CM

## 2018-07-27 DIAGNOSIS — I2721 Secondary pulmonary arterial hypertension: Secondary | ICD-10-CM | POA: Diagnosis not present

## 2018-07-27 DIAGNOSIS — I313 Pericardial effusion (noninflammatory): Secondary | ICD-10-CM | POA: Diagnosis not present

## 2018-07-27 DIAGNOSIS — N183 Chronic kidney disease, stage 3 (moderate): Secondary | ICD-10-CM | POA: Diagnosis present

## 2018-07-27 DIAGNOSIS — I2510 Atherosclerotic heart disease of native coronary artery without angina pectoris: Secondary | ICD-10-CM | POA: Diagnosis present

## 2018-07-27 DIAGNOSIS — I493 Ventricular premature depolarization: Secondary | ICD-10-CM | POA: Diagnosis present

## 2018-07-27 DIAGNOSIS — N3289 Other specified disorders of bladder: Secondary | ICD-10-CM | POA: Diagnosis present

## 2018-07-27 DIAGNOSIS — Z801 Family history of malignant neoplasm of trachea, bronchus and lung: Secondary | ICD-10-CM

## 2018-07-27 DIAGNOSIS — I5030 Unspecified diastolic (congestive) heart failure: Secondary | ICD-10-CM | POA: Diagnosis not present

## 2018-07-27 HISTORY — DX: Non-ST elevation (NSTEMI) myocardial infarction: I21.4

## 2018-07-27 HISTORY — DX: Chronic kidney disease, stage 3 unspecified: N18.30

## 2018-07-27 HISTORY — DX: Chronic kidney disease, stage 3 (moderate): N18.3

## 2018-07-27 HISTORY — DX: Malignant neoplasm of unspecified site of right female breast: C50.911

## 2018-07-27 LAB — CBC WITH DIFFERENTIAL/PLATELET
ABS IMMATURE GRANULOCYTES: 0.04 10*3/uL (ref 0.00–0.07)
BASOS ABS: 0 10*3/uL (ref 0.0–0.1)
BASOS PCT: 0 %
Eosinophils Absolute: 0 10*3/uL (ref 0.0–0.5)
Eosinophils Relative: 0 %
HEMATOCRIT: 37.4 % (ref 36.0–46.0)
Hemoglobin: 11.5 g/dL — ABNORMAL LOW (ref 12.0–15.0)
IMMATURE GRANULOCYTES: 1 %
LYMPHS ABS: 1 10*3/uL (ref 0.7–4.0)
Lymphocytes Relative: 14 %
MCH: 27.4 pg (ref 26.0–34.0)
MCHC: 30.7 g/dL (ref 30.0–36.0)
MCV: 89.3 fL (ref 80.0–100.0)
MONO ABS: 0.5 10*3/uL (ref 0.1–1.0)
MONOS PCT: 7 %
NEUTROS ABS: 5.5 10*3/uL (ref 1.7–7.7)
Neutrophils Relative %: 78 %
Platelets: 162 10*3/uL (ref 150–400)
RBC: 4.19 MIL/uL (ref 3.87–5.11)
RDW: 15.6 % — ABNORMAL HIGH (ref 11.5–15.5)
WBC: 7 10*3/uL (ref 4.0–10.5)
nRBC: 0.4 % — ABNORMAL HIGH (ref 0.0–0.2)

## 2018-07-27 LAB — HEPARIN LEVEL (UNFRACTIONATED): Heparin Unfractionated: <0.1 IU/mL — ABNORMAL LOW (ref 0.30–0.70)

## 2018-07-27 LAB — COMPREHENSIVE METABOLIC PANEL
ALK PHOS: 50 U/L (ref 38–126)
ALT: 26 U/L (ref 0–44)
AST: 35 U/L (ref 15–41)
Albumin: 3.5 g/dL (ref 3.5–5.0)
Anion gap: 12 (ref 5–15)
BUN: 52 mg/dL — AB (ref 8–23)
CALCIUM: 6.1 mg/dL — AB (ref 8.9–10.3)
CO2: 23 mmol/L (ref 22–32)
CREATININE: 2.16 mg/dL — AB (ref 0.44–1.00)
Chloride: 104 mmol/L (ref 98–111)
GFR calc Af Amer: 23 mL/min — ABNORMAL LOW (ref >60)
GFR, EST NON AFRICAN AMERICAN: 20 mL/min — AB (ref >60)
Glucose, Bld: 131 mg/dL — ABNORMAL HIGH (ref 70–99)
Potassium: 4.7 mmol/L (ref 3.5–5.1)
Sodium: 139 mmol/L (ref 135–145)
Total Bilirubin: 0.6 mg/dL (ref 0.3–1.2)
Total Protein: 7.4 g/dL (ref 6.5–8.1)

## 2018-07-27 LAB — TROPONIN I: TROPONIN I: 11.85 ng/mL — AB (ref <0.03)

## 2018-07-27 LAB — BRAIN NATRIURETIC PEPTIDE: B Natriuretic Peptide: 899 pg/mL — ABNORMAL HIGH (ref 0.0–100.0)

## 2018-07-27 MED ORDER — TEMAZEPAM 15 MG PO CAPS
30.0000 mg | ORAL_CAPSULE | Freq: Every evening | ORAL | Status: AC | PRN
  Administered 2018-07-27: 30 mg via ORAL
  Filled 2018-07-27: qty 2

## 2018-07-27 MED ORDER — HEPARIN BOLUS VIA INFUSION
3000.0000 [IU] | Freq: Once | INTRAVENOUS | Status: AC
  Administered 2018-07-27: 3000 [IU] via INTRAVENOUS

## 2018-07-27 MED ORDER — ASPIRIN 81 MG PO CHEW
324.0000 mg | CHEWABLE_TABLET | Freq: Once | ORAL | Status: AC
  Administered 2018-07-27: 243 mg via ORAL
  Filled 2018-07-27: qty 4

## 2018-07-27 MED ORDER — IPRATROPIUM-ALBUTEROL 0.5-2.5 (3) MG/3ML IN SOLN
3.0000 mL | Freq: Once | RESPIRATORY_TRACT | Status: AC
  Administered 2018-07-27: 3 mL via RESPIRATORY_TRACT
  Filled 2018-07-27: qty 3

## 2018-07-27 MED ORDER — HEPARIN (PORCINE) IN NACL 100-0.45 UNIT/ML-% IJ SOLN
900.0000 [IU]/h | INTRAMUSCULAR | Status: DC
  Administered 2018-07-27: 1000 [IU]/h via INTRAVENOUS
  Administered 2018-07-28: 1150 [IU]/h via INTRAVENOUS
  Administered 2018-07-30: 850 [IU]/h via INTRAVENOUS
  Filled 2018-07-27 (×3): qty 250

## 2018-07-27 NOTE — ED Notes (Signed)
Have paged respiratory  

## 2018-07-27 NOTE — ED Provider Notes (Signed)
Emergency Department Provider Note   I have reviewed the triage vital signs and the nursing notes.   HISTORY  Chief Complaint Shortness of Breath   HPI [name] is a 84 y.o. female with PMH of DVT, HTN, PAD, Asthma, CHF (EF 40%), and thyroid carcinoma resents to the emergency department for evaluation of shortness of breath.  She was seen in the emergency department on 10/29 with similar symptoms.  She states she is feeling somewhat better at discharge but has continued to have shortness of breath over the past several days.  Denies productive cough or fever.  She has not had any chest pain/tightness.  No heart palpitations.  Did have some vomiting after returning home from the emergency department on 10/29.  Patient states that her daughter showed up to check on her and saw that she was having worsening shortness of breath and seemed very weak so insisted she return to the emergency department.  She did see her PCP yesterday who ordered a noncontrast CT scan and lab work. She has been taking her lasix as prescribed.   Past Medical History:  Diagnosis Date  . Asthma   . Breast cancer ([place])   . Coronary atherosclerosis    Cardiac catheterization 2004 with 60% mid LAD  . DVT (deep venous thrombosis) ([place])   . Essential hypertension   . History of breast cancer 1987   Right-sided, mastectomy with axillary node dissection and Adriamycin   . History of thyroid cancer   . Hodgkin lymphoma ([place]) 04/03/2017  . Hodgkin's disease ([place])   . Hypothyroidism   . Nonischemic cardiomyopathy (HCC)    LVEF improved to 50-55% as of 2014  . Obesity   . Osteoporosis   . Peripheral arterial disease ([place])    Stent to the left common iliac in 2006  . Thyroid carcinoma ([place]) 04/03/2017    Patient Active Problem List   Diagnosis Date Noted  . NSTEMI (non-ST elevated myocardial infarction) ([place]) 07/27/2018  . History of right breast cancer 04/03/2017  . Hodgkin lymphoma ([place]) 04/03/2017    . Thyroid carcinoma ([place]) 04/03/2017  . Peripheral arterial disease ([place]) 08/12/2014  . Essential hypertension 08/12/2014  . CAD (coronary artery disease), native coronary artery 02/16/2014  . Nonischemic cardiomyopathy ([place]) 02/16/2014    Past Surgical History:  Procedure Laterality Date  . ABDOMINAL HYSTERECTOMY    . ANGIOPLASTY / STENTING FEMORAL Left 07/13/2005  . APPENDECTOMY    . BUNIONECTOMY Right   . CATARACT EXTRACTION W/PHACO  05/28/2012   Procedure: CATARACT EXTRACTION PHACO AND INTRAOCULAR LENS PLACEMENT (IOC);  Surgeon: Elta Guadeloupe T. Gershon Crane, MD;  Location: [facility];  Service: Ophthalmology;  Laterality: Right;  CDE 12.86  . CATARACT EXTRACTION W/PHACO  06/04/2012   Procedure: CATARACT EXTRACTION PHACO AND INTRAOCULAR LENS PLACEMENT (IOC);  Surgeon: Elta Guadeloupe T. Gershon Crane, MD;  Location: [facility];  Service: Ophthalmology;  Laterality: Left;  CDE:15.32  . COLONOSCOPY    . MASTECTOMY PARTIAL / LUMPECTOMY W/ AXILLARY LYMPHADENECTOMY Right 1987  . THYROIDECTOMY    . TONSILLECTOMY    . YAG LASER APPLICATION Right 57/32/2025   Procedure: YAG LASER APPLICATION;  Surgeon: Rutherford Guys, MD;  Location: [facility];  Service: Ophthalmology;  Laterality: Right;  . YAG LASER APPLICATION Left 12/25/7060   Procedure: YAG LASER APPLICATION;  Surgeon: Rutherford Guys, MD;  Location: [facility];  Service: Ophthalmology;  Laterality: Left;   Allergies Patient has no known allergies.  Family History  Problem Relation Age of Onset  . Lung  cancer Mother   . Heart attack Father     Social History Social History   Tobacco Use  . Smoking status: Never Smoker  . Smokeless tobacco: Never Used  Substance Use Topics  . Alcohol use: No  . Drug use: No    Review of Systems  Constitutional: No fever/chills. Positive fatigue.  Eyes: No visual changes. ENT: No sore throat. Cardiovascular: Denies chest pain. Respiratory: Positive shortness of breath. Gastrointestinal: No abdominal pain.  No nausea, no vomiting.  No  diarrhea.  No constipation. Genitourinary: Negative for dysuria. Musculoskeletal: Negative for back pain. Skin: Negative for rash. Neurological: Negative for headaches, focal weakness or numbness.  10-point ROS otherwise negative.  ____________________________________________   PHYSICAL EXAM:  VITAL SIGNS: ED Triage Vitals  Enc Vitals Group     BP 07/27/18 1813 116/76     Pulse Rate 07/27/18 1813 86     Resp 07/27/18 1813 (!) 26     Temp 07/27/18 1813 98.7 F (37.1 C)     Temp Source 07/27/18 1813 Oral     SpO2 07/27/18 1813 92 %     Weight 07/27/18 1810 192 lb 14.4 oz (87.5 kg)     Pain Score 07/27/18 1810 0   Constitutional: Alert and oriented. Well appearing and in no acute distress. Eyes: Conjunctivae are normal.  Head: Atraumatic. Nose: No congestion/rhinnorhea. Mouth/Throat: Mucous membranes are moist.  Neck: No stridor.   Cardiovascular: Normal rate, regular rhythm. Good peripheral circulation. Grossly normal heart sounds.   Respiratory: Increased respiratory effort.  No retractions. Lungs with end-expiratory wheezing at the bases.  Gastrointestinal: Soft and nontender. No distention.  Musculoskeletal: No lower extremity tenderness nor edema. No gross deformities of extremities. Neurologic:  Normal speech and language. No gross focal neurologic deficits are appreciated.  Skin:  Skin is warm, dry and intact. No rash noted.  ____________________________________________   LABS (all labs ordered are listed, but only abnormal results are displayed)  Labs Reviewed  COMPREHENSIVE METABOLIC PANEL - Abnormal; Notable for the following components:      Result Value   Glucose, Bld 131 (*)    BUN 52 (*)    Creatinine, Ser 2.16 (*)    Calcium 6.1 (*)    GFR calc non Af Amer 20 (*)    GFR calc Af Amer 23 (*)    All other components within normal limits  BRAIN NATRIURETIC PEPTIDE - Abnormal; Notable for the following components:   B Natriuretic Peptide 899.0 (*)    All  other components within normal limits  TROPONIN I - Abnormal; Notable for the following components:   Troponin I 11.85 (*)    All other components within normal limits  CBC WITH DIFFERENTIAL/PLATELET - Abnormal; Notable for the following components:   Hemoglobin 11.5 (*)    RDW 15.6 (*)    nRBC 0.4 (*)    All other components within normal limits  URINALYSIS, ROUTINE W REFLEX MICROSCOPIC - Abnormal; Notable for the following components:   APPearance HAZY (*)    Hgb urine dipstick LARGE (*)    Leukocytes, UA TRACE (*)    Bacteria, UA RARE (*)    All other components within normal limits  HEPARIN LEVEL (UNFRACTIONATED) - Abnormal; Notable for the following components:   Heparin Unfractionated <0.10 (*)    All other components within normal limits  CBC - Abnormal; Notable for the following components:   Hemoglobin 11.1 (*)    nRBC 0.4 (*)    All other components within  normal limits  TROPONIN I - Abnormal; Notable for the following components:   Troponin I 11.13 (*)    All other components within normal limits  LIPID PANEL - Abnormal; Notable for the following components:   LDL Cholesterol 109 (*)    All other components within normal limits  HEPARIN LEVEL (UNFRACTIONATED)   ____________________________________________  EKG   EKG Interpretation  Date/Time:  Saturday July 27 2018 18:28:40 EDT Ventricular Rate:  85 PR Interval:    QRS Duration: 102 QT Interval:  360 QTC Calculation: 428 R Axis:   -62 Text Interpretation:  Sinus rhythm Consider left atrial enlargement LAD, consider left anterior fascicular block Borderline low voltage, extremity leads No STEMI.  Confirmed by Nanda Quinton [phone]) on 07/27/2018 6:38:35 PM       ____________________________________________  RADIOLOGY  Dg Chest 2 View  Result Date: 07/27/2018 CLINICAL DATA:  Weakness and shortness of breath. EXAM: CHEST - 2 VIEW COMPARISON:  CT chest from yesterday. FINDINGS: Stable cardiomegaly. Normal  pulmonary vascularity. Atherosclerotic calcification of the aortic arch. No focal consolidation, pleural effusion, or pneumothorax. No acute osseous abnormality. Unchanged surgical clips in the right axilla. IMPRESSION: No active cardiopulmonary disease. Electronically Signed   By: Titus Dubin M.D.   On: 07/27/2018 19:24    ____________________________________________   PROCEDURES  Procedure(s) performed:   Procedures  CRITICAL CARE Performed by: Margette Fast Total critical care time: 35 minutes Critical care time was exclusive of separately billable procedures and treating other patients. Critical care was necessary to treat or prevent imminent or life-threatening deterioration. Critical care was time spent personally by me on the following activities: development of treatment plan with patient and/or surrogate as well as nursing, discussions with consultants, evaluation of patient's response to treatment, examination of patient, obtaining history from patient or surrogate, ordering and performing treatments and interventions, ordering and review of laboratory studies, ordering and review of radiographic studies, pulse oximetry and re-evaluation of patient's condition.  Nanda Quinton, MD Emergency Medicine  ____________________________________________   INITIAL IMPRESSION / ASSESSMENT AND PLAN / ED COURSE  Pertinent labs & imaging results that were available during my care of the patient were reviewed by me and considered in my medical decision making (see chart for details).  Patient presents to the emergency department for evaluation of worsening shortness of breath and fatigue.  She is not having any chest pain.  She does have history of asthma and systolic congestive heart failure.  She does have CAD history as well.  Labs obtained which showed slightly worsening kidney function and troponin of 11.  I-STAT troponin from 10/29 was 0.07.  KG with no acute changes.  Chest x-ray  reviewed with no acute findings.  Patient is feeling slightly better after nebulizer treatment here.   I discussed the case with cardiology Dr. Rhae Hammock at [facility]. He accepts the patient to [facility] and I have placed temporary hold orders at his request. Will give ASA and Heparin.   Discussed patient's case with Cardiology to request admission. Patient and family (if present) updated with plan. Care transferred to Cardiology service.  I reviewed all nursing notes, vitals, pertinent old records, EKGs, labs, imaging (as available).  ____________________________________________  FINAL CLINICAL IMPRESSION(S) / ED DIAGNOSES  Final diagnoses:  NSTEMI (non-ST elevated myocardial infarction) ([place])     MEDICATIONS GIVEN DURING THIS VISIT:  Medications  heparin ADULT infusion 100 units/mL (25000 units/276mL sodium chloride 0.45%) (1,000 Units/hr Intravenous Transfusing/Transfer 07/28/18 0804)  aspirin chewable tablet 81 mg (has  no administration in time range)  atorvastatin (LIPITOR) tablet 80 mg (has no administration in time range)  ipratropium-albuterol (DUONEB) 0.5-2.5 (3) MG/3ML nebulizer solution 3 mL (3 mLs Nebulization Given 07/27/18 1830)  aspirin chewable tablet 324 mg (243 mg Oral Given 07/27/18 2029)  heparin bolus via infusion 3,000 Units (3,000 Units Intravenous Bolus from Bag 07/27/18 2111)  temazepam (RESTORIL) capsule 30 mg (30 mg Oral Given 07/27/18 2333)    Note:  This document was prepared using Dragon voice recognition software and may include unintentional dictation errors.  Nanda Quinton, MD Emergency Medicine    Talullah Abate, [facility], MD 07/28/18 [phone]

## 2018-07-27 NOTE — ED Notes (Signed)
Date and time results received: 07/27/18 2003 (use smartphrase ".now" to insert current time)  Test:Calcium/Troponin Critical Value: Calcium 6.1/Troponin 11.85 Name of Provider Notified: Dr Laverta [place]  Orders Received? Or Actions Taken?:NA

## 2018-07-27 NOTE — ED Triage Notes (Signed)
Pt seen on 07/23/18 in ED for resp problems. Pt reports she has been SOB since Tuesday . Daughter visited pt today and she was SOB after walking. Denies cough

## 2018-07-27 NOTE — ED Notes (Addendum)
CONTACT INFO:  Chyanne Kohut (Son): [phone]  Candie Chroman (family friend): [phone]  [place] [facility]): [phone]

## 2018-07-27 NOTE — Progress Notes (Signed)
ANTICOAGULATION CONSULT NOTE - Initial Consult  Pharmacy Consult for heparin dosing Indication:  ACS/NSTEMI  No Known Allergies  Patient Measurements: Weight: 192 lb 14.4 oz (87.5 kg) Heparin Dosing Weight: 87.5kg   Vital Signs: Temp: 98.7 F (37.1 C) (11/02 1813) Temp Source: Oral (11/02 1813) BP: 128/78 (11/02 2030) Pulse Rate: 83 (11/02 2030)  Labs: Recent Labs    07/26/18 1543 07/27/18 1904  HGB 12.1 11.5*  HCT 39.7 37.4  PLT 164 162  CREATININE 2.22* 2.16*  TROPONINI  --  11.85*    Estimated Creatinine Clearance: 22.5 mL/min (A) (by C-G formula based on SCr of 2.16 mg/dL (H)).   Medical History: Past Medical History:  Diagnosis Date  . Asthma   . Breast cancer ([place])   . Coronary atherosclerosis    Cardiac catheterization 2004 with 60% mid LAD  . DVT (deep venous thrombosis) ([place])   . Essential hypertension   . History of breast cancer 1987   Right-sided, mastectomy with axillary node dissection and Adriamycin   . History of thyroid cancer   . Hodgkin lymphoma ([place]) 04/03/2017  . Hodgkin's disease ([place])   . Hypothyroidism   . Nonischemic cardiomyopathy (HCC)    LVEF improved to 50-55% as of 2014  . Obesity   . Osteoporosis   . Peripheral arterial disease ([place])    Stent to the left common iliac in 2006  . Thyroid carcinoma ([place]) 04/03/2017    Assessment: Pharmacy consulted to dose heparin for this 18 yof with ACS/NSTEMI.  Troponin I is elevated at 11.85.  She doesn't appear to have been on any  anti-coagulant prior to admission.  Goal of Therapy:  Heparin level 0.3-0.7 units/ml Monitor platelets by anticoagulation protocol: Yes   Plan:  Give 3000 units bolus x 1 Start heparin infusion at 1000 units/hr Check anti-Xa level in 6-8 hours and daily while on heparin Continue to monitor H&H and platelets  Despina Pole 07/27/2018,8:42 PM

## 2018-07-28 ENCOUNTER — Inpatient Hospital Stay (HOSPITAL_COMMUNITY): Payer: Medicare HMO

## 2018-07-28 ENCOUNTER — Telehealth: Payer: Self-pay | Admitting: Physician Assistant

## 2018-07-28 ENCOUNTER — Other Ambulatory Visit (HOSPITAL_COMMUNITY): Payer: Medicare HMO

## 2018-07-28 DIAGNOSIS — I214 Non-ST elevation (NSTEMI) myocardial infarction: Principal | ICD-10-CM

## 2018-07-28 LAB — LIPID PANEL
Cholesterol: 181 mg/dL (ref 0–200)
HDL: 58 mg/dL (ref >40)
LDL Cholesterol: 109 mg/dL — ABNORMAL HIGH (ref 0–99)
TRIGLYCERIDES: 68 mg/dL (ref <150)
Total CHOL/HDL Ratio: 3.1 RATIO
VLDL: 14 mg/dL (ref 0–40)

## 2018-07-28 LAB — URINALYSIS, ROUTINE W REFLEX MICROSCOPIC
BILIRUBIN URINE: NEGATIVE
GLUCOSE, UA: NEGATIVE mg/dL
Ketones, ur: NEGATIVE mg/dL
NITRITE: NEGATIVE
PH: 5 (ref 5.0–8.0)
Protein, ur: NEGATIVE mg/dL
SPECIFIC GRAVITY, URINE: 1.013 (ref 1.005–1.030)

## 2018-07-28 LAB — CBC
HCT: 36.2 % (ref 36.0–46.0)
Hemoglobin: 11.1 g/dL — ABNORMAL LOW (ref 12.0–15.0)
MCH: 28 pg (ref 26.0–34.0)
MCHC: 30.7 g/dL (ref 30.0–36.0)
MCV: 91.4 fL (ref 80.0–100.0)
NRBC: 0.4 % — AB (ref 0.0–0.2)
PLATELETS: 150 10*3/uL (ref 150–400)
RBC: 3.96 MIL/uL (ref 3.87–5.11)
RDW: 15.4 % (ref 11.5–15.5)
WBC: 7.8 10*3/uL (ref 4.0–10.5)

## 2018-07-28 LAB — TROPONIN I
Troponin I: 11.13 ng/mL (ref <0.03)
Troponin I: 10.03 ng/mL (ref <0.03)

## 2018-07-28 LAB — TSH: TSH: 3.202 u[IU]/mL (ref 0.350–4.500)

## 2018-07-28 LAB — HEPARIN LEVEL (UNFRACTIONATED)
HEPARIN UNFRACTIONATED: 0.66 [IU]/mL (ref 0.30–0.70)
HEPARIN UNFRACTIONATED: 0.2 [IU]/mL — AB (ref 0.30–0.70)

## 2018-07-28 MED ORDER — PREDNISONE 5 MG PO TABS
5.0000 mg | ORAL_TABLET | Freq: Every day | ORAL | Status: AC
  Administered 2018-07-31 – 2018-08-01 (×2): 5 mg via ORAL
  Filled 2018-07-28 (×2): qty 1

## 2018-07-28 MED ORDER — CARVEDILOL 3.125 MG PO TABS: 3.1250 mg | ORAL_TABLET | Freq: Two times a day (BID) | ORAL | Status: DC

## 2018-07-28 MED ORDER — LEVOTHYROXINE SODIUM 100 MCG PO TABS
100.0000 ug | ORAL_TABLET | Freq: Every day | ORAL | Status: DC
  Administered 2018-07-28 – 2018-08-01 (×5): 100 ug via ORAL
  Filled 2018-07-28 (×5): qty 1

## 2018-07-28 MED ORDER — SODIUM CHLORIDE 0.9% FLUSH
3.0000 mL | Freq: Two times a day (BID) | INTRAVENOUS | Status: DC
  Administered 2018-07-29 – 2018-08-01 (×6): 3 mL via INTRAVENOUS

## 2018-07-28 MED ORDER — ACETAMINOPHEN 325 MG PO TABS: 650.0000 mg | ORAL_TABLET | ORAL | Status: DC | PRN

## 2018-07-28 MED ORDER — SODIUM CHLORIDE 0.9 % IV SOLN: 250.0000 mL | INTRAVENOUS | Status: DC | PRN

## 2018-07-28 MED ORDER — ASPIRIN 81 MG PO CHEW
81.0000 mg | CHEWABLE_TABLET | Freq: Every day | ORAL | Status: DC
  Administered 2018-07-28 – 2018-07-30 (×3): 81 mg via ORAL
  Filled 2018-07-28 (×4): qty 1

## 2018-07-28 MED ORDER — CARVEDILOL 3.125 MG PO TABS
3.1250 mg | ORAL_TABLET | Freq: Two times a day (BID) | ORAL | Status: DC
  Administered 2018-07-28 – 2018-08-01 (×9): 3.125 mg via ORAL
  Filled 2018-07-28 (×9): qty 1

## 2018-07-28 MED ORDER — ATORVASTATIN CALCIUM 80 MG PO TABS
80.0000 mg | ORAL_TABLET | Freq: Every day | ORAL | Status: DC
  Administered 2018-07-28 – 2018-08-01 (×5): 80 mg via ORAL
  Filled 2018-07-28 (×5): qty 1

## 2018-07-28 MED ORDER — PREDNISONE 10 MG PO TABS
10.0000 mg | ORAL_TABLET | Freq: Every day | ORAL | Status: AC
  Administered 2018-07-29 – 2018-07-30 (×2): 10 mg via ORAL
  Filled 2018-07-28 (×2): qty 1

## 2018-07-28 MED ORDER — SODIUM CHLORIDE 0.9% FLUSH: 3.0000 mL | INTRAVENOUS | Status: DC | PRN

## 2018-07-28 MED ORDER — NITROGLYCERIN 0.4 MG SL SUBL: 0.4000 mg | SUBLINGUAL_TABLET | SUBLINGUAL | Status: DC | PRN

## 2018-07-28 MED ORDER — BUDESONIDE 0.25 MG/2ML IN SUSP
0.2500 mg | Freq: Two times a day (BID) | RESPIRATORY_TRACT | Status: DC
  Administered 2018-07-28 – 2018-08-01 (×8): 0.25 mg via RESPIRATORY_TRACT
  Filled 2018-07-28 (×10): qty 2

## 2018-07-28 MED ORDER — TEMAZEPAM 7.5 MG PO CAPS
30.0000 mg | ORAL_CAPSULE | Freq: Every day | ORAL | Status: DC
  Administered 2018-07-28 – 2018-07-31 (×4): 30 mg via ORAL
  Filled 2018-07-28 (×4): qty 4

## 2018-07-28 MED ORDER — HEPARIN BOLUS VIA INFUSION
2000.0000 [IU] | Freq: Once | INTRAVENOUS | Status: AC
  Administered 2018-07-28: 2000 [IU] via INTRAVENOUS
  Filled 2018-07-28: qty 2000

## 2018-07-28 MED ORDER — ONDANSETRON HCL 4 MG/2ML IJ SOLN: 4.0000 mg | Freq: Four times a day (QID) | INTRAMUSCULAR | Status: DC | PRN

## 2018-07-28 NOTE — H&P (Addendum)
Cardiology Consultation:   Patient ID: [name]; [id number]; [birth year]   Admit date: 07/27/2018 Date of Consult: 07/28/2018  Primary Care Provider: Lemmie Evens, MD Primary Cardiologist: Rozann Lesches, MD Primary Electrophysiologist:  None  Chief Complaint: SOB  Patient Profile:   [name] is a 84 y.o. female with a hx of nonobstructive CAD by cath 2004 (60% LAD), presumed NICM EF 40%, asthma, breast CA (mastectomy/Adriamycin), Hodgkin's disease, obesity, PAD s/p stent to LCIA 2006, thyroid carcinoma, CKD stage III by labs (unknown to patient) who is being seen today for the evaluation of NSTEMI at the request of Dr. Laverta Baltimore.  History of Present Illness:   Records are somewhat challenging to follow in the chart as there are numerous studies all scanned under the date 05/2015 but of varying timepoints out of order. Remote cath done 07/2003 showed 60% mLAD with negative IVUS, original report scanned under CARDIAC CATHETERIZATION in CV Proc 2016 with negative disease elsewhere, EF 50-55%. Last ischemic eval was by nuc 2011 showing attenuation artifact inferiorly without significant ischemia demonstrated, EF 33%, reported to be low risk scan. EF has varied over the years - echo in 2010 showed EF 50-55%, 01/2011 showed EF 35%, 2013 showed 40-45%, 2018 30-35%, 03/2018 EF 40% with diffuse HK, grade 1 DD, mild-mod LVH, PASP 44mmHg mildly increased.  She is very active and walked 2 miles on Oct 26th as usual. She exercised on Monday Oct 28th as normal and walked the dog and felt fine. She also worked out in her garden. On Tues Oct 29th she was with a friend who noted she appeared SOB so took her to primary care office. Based on how she looked it was advised she go to hospital by EMS but rescue squad indicated there would be a delay so she says Dr. Karie Kirks put her in his car and drove her there himself. CXR nonacute, troponin 0.07, BNP elevated. She was felt to have asthma  exacerbation and given prednisone as well as dose of Lasix for elevated BNP and DC home with close OP f/u. She felt fine the following 2 days but when she went back to primary care office she was noted to still be SOB with walking and was advised to return to the ER. Labs show BNP [phone], troponin 11.85-11.13, BUN 52/Cr 2.16 (prior baseline unclear, 1.7-1.9 recently - previous values 1.4-1.6 in 2016), LDL 109, UA with large Hgb and trace leuks. CT chest (noncontrast) shows multiple abnormalities with ground glass nodules, 5 mm left mainstem bronchus endobronchial soft tissue nodule (this suggested benign), small pericardial effusion, enlarged heart, coronary atherosclerosis, diminutive caliber and coarse dystrophic calcifications of the left brachiocephalic vein, likely due to chronic obstruction, enlargement of main pulm trunk usually a/w pulm HTN, irregular contour of L kidney, thickening of gastric wall along the greater curvature of the stomach which could represent artifact from stomach distention/undigested food matter but cannot exclude gastric mass suggest UGI endoscopy when stable. She denies any orthopnea, LEE, PND, weight gain (instead reporting 6lb weight loss which was a surprise to her). She has not had any CP at all. Tele shows NSR with occ PVCs/couplets. BP is intermittently soft with nadir 84/54. In the [facility] ER she was given ASA, heparin, Duoneb. She did not receive any diuretics but states she feels significantly better - but states she has felt improved lately while lying down rather than sitting up or moving. Denies any fevers, chills, nausea, vomiting, diaphoresis, GI or urinary bleeding sx.  Past Medical History:  Diagnosis Date  . Asthma   . Breast cancer ([place])   . Coronary atherosclerosis    Cardiac catheterization 2004 with 60% mid LAD  . DVT (deep venous thrombosis) ([place])   . Essential hypertension   . History of breast cancer 1987   Right-sided, mastectomy with axillary node  dissection and Adriamycin   . History of thyroid cancer   . Hodgkin lymphoma ([place]) 04/03/2017  . Hodgkin's disease ([place])   . Hypothyroidism   . Nonischemic cardiomyopathy ([facility])    LVEF improved to 50-55% as of 2014  . Obesity   . Osteoporosis   . Peripheral arterial disease ([place])    Stent to the left common iliac in 2006  . Thyroid carcinoma ([place]) 04/03/2017    Past Surgical History:  Procedure Laterality Date  . ABDOMINAL HYSTERECTOMY    . ANGIOPLASTY / STENTING FEMORAL Left 07/13/2005  . APPENDECTOMY    . BUNIONECTOMY Right   . CATARACT EXTRACTION W/PHACO  05/28/2012   Procedure: CATARACT EXTRACTION PHACO AND INTRAOCULAR LENS PLACEMENT (IOC);  Surgeon: Elta Guadeloupe T. Gershon Crane, MD;  Location: [facility];  Service: Ophthalmology;  Laterality: Right;  CDE 12.86  . CATARACT EXTRACTION W/PHACO  06/04/2012   Procedure: CATARACT EXTRACTION PHACO AND INTRAOCULAR LENS PLACEMENT (IOC);  Surgeon: Elta Guadeloupe T. Gershon Crane, MD;  Location: [facility];  Service: Ophthalmology;  Laterality: Left;  CDE:15.32  . COLONOSCOPY    . MASTECTOMY PARTIAL / LUMPECTOMY W/ AXILLARY LYMPHADENECTOMY Right 1987  . THYROIDECTOMY    . TONSILLECTOMY    . YAG LASER APPLICATION Right 81/19/1478   Procedure: YAG LASER APPLICATION;  Surgeon: Rutherford Guys, MD;  Location: [facility];  Service: Ophthalmology;  Laterality: Right;  . YAG LASER APPLICATION Left 11/04/5619   Procedure: YAG LASER APPLICATION;  Surgeon: Rutherford Guys, MD;  Location: [facility];  Service: Ophthalmology;  Laterality: Left;     Inpatient Medications: Scheduled Meds: . aspirin  81 mg Oral Daily  . atorvastatin  80 mg Oral q1800   Continuous Infusions: . heparin 1,000 Units/hr (07/28/18 0600)   PRN Meds:   Home Meds: Prior to Admission medications   Medication Sig Start Date End Date Taking? Authorizing Provider  aspirin 81 MG tablet Take 81 mg by mouth daily.   Yes [provider]  Calcium Carbonate-Vitamin D (CALCIUM 600 + D PO) Take 1 tablet by mouth daily.    Yes [provider]  carvedilol (COREG) 3.125 MG tablet Take 1 tablet (3.125 mg total) by mouth 2 (two) times daily. 04/09/17  Yes Satira Sark, MD  diphenhydramine-acetaminophen (TYLENOL PM) 25-500 MG TABS Take 1 tablet by mouth at bedtime as needed (sleep).    Yes [provider]  FLOVENT HFA 44 MCG/ACT inhaler Inhale 2 puffs into the lungs 2 (two) times daily. 07/11/18  Yes [provider]  furosemide (LASIX) 20 MG tablet Take 1 tablet (20 mg total) by mouth daily. 07/23/18  Yes Milton Ferguson, MD  levothyroxine (SYNTHROID, LEVOTHROID) 100 MCG tablet Take 100 mcg by mouth daily.   Yes [provider]  lisinopril (PRINIVIL,ZESTRIL) 5 MG tablet Take 1 tablet (5 mg total) by mouth daily. 04/15/18  Yes Satira Sark, MD  Multiple Vitamins-Calcium (ONE-A-DAY WOMENS PO) Take 1 tablet by mouth daily.   Yes [provider]  predniSONE (DELTASONE) 10 MG tablet Take 2 tablets (20 mg total) by mouth daily. 07/23/18  Yes Milton Ferguson, MD  simvastatin (ZOCOR) 40 MG tablet Take 1 tablet (40 mg total) by  mouth at bedtime. 04/09/17  Yes Satira Sark, MD  spironolactone (ALDACTONE) 25 MG tablet Take 0.5 tablets (12.5 mg total) by mouth daily. 04/09/17  Yes Satira Sark, MD  temazepam (RESTORIL) 30 MG capsule Take 30 mg by mouth at bedtime.  12/24/14  Yes [provider]  vitamin C (ASCORBIC ACID) 500 MG tablet Take 500 mg by mouth daily.   Yes [provider]    Allergies:   No Known Allergies  Social History:   Social History   Socioeconomic History  . Marital status: Widowed    Spouse name: Not on file  . Number of children: Not on file  . Years of education: Not on file  . Highest education level: Not on file  Occupational History  . Not on file  Social Needs  . Financial resource strain: Not on file  . Food insecurity:    Worry: Not on file    Inability: Not on file  . Transportation needs:    Medical: Not on  file    Non-medical: Not on file  Tobacco Use  . Smoking status: Never Smoker  . Smokeless tobacco: Never Used  Substance and Sexual Activity  . Alcohol use: No  . Drug use: No  . Sexual activity: Never    Birth control/protection: Surgical  Lifestyle  . Physical activity:    Days per week: Not on file    Minutes per session: Not on file  . Stress: Not on file  Relationships  . Social connections:    Talks on phone: Not on file    Gets together: Not on file    Attends religious service: Not on file    Active member of club or organization: Not on file    Attends meetings of clubs or organizations: Not on file    Relationship status: Not on file  . Intimate partner violence:    Fear of current or ex partner: Not on file    Emotionally abused: Not on file    Physically abused: Not on file    Forced sexual activity: Not on file  Other Topics Concern  . Not on file  Social History Narrative  . Not on file    Family History:   The patient's family history includes Heart attack in her father; Lung cancer in her mother.  ROS:  Please see the history of present illness.  All other ROS reviewed and negative.     Physical Exam/Data:   Vitals:   07/28/18 0700 07/28/18 0730 07/28/18 0800 07/28/18 0935  BP: 98/60 (!) 103/50 100/60 120/70  Pulse: (!) 25 62 64 77  Resp: (!) 21 13 19  (!) 23  Temp:    98 F (36.7 C)  TempSrc:    Oral  SpO2: 94% 96% 96% 100%  Weight:    84.5 kg  Height:    5\' 9"  (1.753 m)    Intake/Output Summary (Last 24 hours) at 07/28/2018 1202 Last data filed at 07/28/2018 0600 Gross per 24 hour  Intake 90 ml  Output -  Net 90 ml   Filed Weights   07/27/18 1810 07/27/18 2030 07/28/18 0935  Weight: 87.5 kg 87.5 kg 84.5 kg   Body mass index is 27.5 kg/m.  General: Well developed, well nourished, in no acute distress. Head: Normocephalic, atraumatic, sclera non-icteric, no xanthomas, nares are without discharge.  Neck: Negative for carotid bruits.  JVD not elevated. Lungs: Mildly decreased BS throughout but no wheezes, rales, or rhonchi. Breathing  is unlabored. Heart: RRR with S1 S2. No murmurs, rubs, or gallops appreciated. Abdomen: Soft, non-tender, non-distended with normoactive bowel sounds. No hepatomegaly. No rebound/guarding. No obvious abdominal masses. Msk:  Strength and tone appear normal for age. Extremities: No clubbing or cyanosis. No edema.  Distal pedal pulses are 2+ and equal bilaterally. Neuro: Alert and oriented X 3. No facial asymmetry. No focal deficit. Moves all extremities spontaneously. Psych:  Responds to questions appropriately with a normal affect.  EKG:  The EKG was personally reviewed and demonstrates NSR possible LAE, possible LAFB nonspecific STT changes. Comparatively speaking, 07/23/18 did show dynamic ST-T changes different than this admission's changes  Relevant CV Studies: As above  Laboratory Data:  Chemistry Recent Labs  Lab 07/23/18 1404 07/23/18 1411 07/26/18 1543 07/27/18 1904  NA 138 138 138 139  K 4.7 4.9 4.5 4.7  CL 104 105 104 104  CO2 24  --  21* 23  GLUCOSE 180* 176* 132* 131*  BUN 29* 32* 47* 52*  CREATININE 1.73* 1.90* 2.22* 2.16*  CALCIUM 7.4*  --  6.6* 6.1*  GFRNONAA 26*  --  19* 20*  GFRAA 30*  --  22* 23*  ANIONGAP 10  --  13 12    Recent Labs  Lab 07/23/18 1404 07/27/18 1904  PROT 7.9 7.4  ALBUMIN 3.8 3.5  AST 28 35  ALT 21 26  ALKPHOS 64 50  BILITOT 1.0 0.6   Hematology Recent Labs  Lab 07/26/18 1543 07/27/18 1904 07/28/18 0526  WBC 8.7 7.0 7.8  RBC 4.45 4.19 3.96  HGB 12.1 11.5* 11.1*  HCT 39.7 37.4 36.2  MCV 89.2 89.3 91.4  MCH 27.2 27.4 28.0  MCHC 30.5 30.7 30.7  RDW 15.4 15.6* 15.4  PLT 164 162 150   Cardiac Enzymes Recent Labs  Lab 07/27/18 1904 07/27/18 2323  TROPONINI 11.85* 11.13*    Recent Labs  Lab 07/23/18 1352  TROPIPOC 0.07    BNP Recent Labs  Lab 07/23/18 1404 07/26/18 1543 07/27/18 1904  BNP 681.0* 1,074.0* 899.0*     DDimer No results for input(s): DDIMER in the last 168 hours.  Radiology/Studies:  Dg Chest 2 View  Result Date: 07/27/2018 CLINICAL DATA:  Weakness and shortness of breath. EXAM: CHEST - 2 VIEW COMPARISON:  CT chest from yesterday. FINDINGS: Stable cardiomegaly. Normal pulmonary vascularity. Atherosclerotic calcification of the aortic arch. No focal consolidation, pleural effusion, or pneumothorax. No acute osseous abnormality. Unchanged surgical clips in the right axilla. IMPRESSION: No active cardiopulmonary disease. Electronically Signed   By: Titus Dubin M.D.   On: 07/27/2018 19:24   Ct Chest Wo Contrast  Result Date: 07/26/2018 CLINICAL DATA:  Shortness of breath labored breathing. EXAM: CT CHEST WITHOUT CONTRAST TECHNIQUE: Multidetector CT imaging of the chest was performed following the standard protocol without IV contrast. COMPARISON:  Chest radiograph 07/23/2018, chest CT 05/13/2002 FINDINGS: Cardiovascular: Enlarged heart. Small pericardial effusion measuring 5 mm in greatest thickness. Calcific atherosclerotic disease of the coronary arteries and aorta. Tortuosity of the aorta without aneurysmal dilation. Enlargement of the main pulmonary artery, usually associated with pulmonary arterial hypertension. Diminutive caliber and coarse dystrophic calcifications along the left brachiocephalic vein, likely due to chronic occlusion. Mediastinum/Nodes: No enlarged mediastinal or axillary lymph nodes. The trachea, and esophagus demonstrate no significant findings. Probable prior left thyroidectomy. Lungs/Pleura: Mild hyperinflation of the lungs. Left upper lobe ground-glass nodule measures 8 mm, image 23/160, sequence 4 and image 100/151, sequence 6. Additional smaller ground-glass nodule in the right upper lobe  medially, image 33/160 and 68/151. Mild nodular thickening of the pleura in bilateral lung apices. 5 mm posterior left mainstem bronchus soft tissue nodule, stable from the prior chest  CT dated 05/13/2002. Upper Abdomen: Irregular contour of the left kidney, incompletely visualized due to lack of IV contrast. Mild thickening of the left adrenal gland, stable. Apparent thickening of the gastric wall along the greater curvature, incompletely visualized. Musculoskeletal: No chest wall mass or suspicious bone lesions identified. Post right breast lumpectomy and axillary lymph node dissection. IMPRESSION: Ground-glass nodules in the left upper lobe, the larger measuring 8 mm. Non-contrast chest CT at 3-6 months is recommended. If the nodules are stable at time of repeat CT, then future CT at 18-24 months (from today's scan) is considered optional for low-risk patients, but is recommended for high-risk patients. This recommendation follows the consensus statement: Guidelines for Management of Incidental Pulmonary Nodules Detected on CT Images: From the Fleischner Society 2017; Radiology 2017; 284:228-243. 5 mm left mainstem bronchus endobronchial soft tissue nodule, which is stable to the prior CT dated 05/13/2002. The long-term stability suggests benign or indolent etiology. Enlarged heart with small pericardial effusion. Calcific atherosclerotic disease of the coronary arteries and aorta. Diminutive caliber and coarse dystrophic calcifications of the left brachiocephalic vein, likely due to chronic obstruction. Enlargement of the main pulmonary trunk, usually associated with pulmonary arterial hypertension. Irregular contour of the left kidney, incompletely visualized due to lack of IV contrast. Apparent thickening of the gastric wall along the greater curvature of the stomach. This may represent artifact from incompletely distended stomach/undigested food matter. Gastric wall mass cannot be entirely excluded however. Correlation to upper GI endoscopy may be considered when clinically feasible. Aortic Atherosclerosis (ICD10-I70.0). Electronically Signed   By: Fidela Salisbury M.D.   On: 07/26/2018  18:17    Assessment and Plan:   1. Shortness of breath and probable NSTEMI with prior nonobstructive CAD (suspect now obstructive) - troponins have clearly ruled in. Nannette Zill continue ASA and heparin. Continue BB with hold parameters for BP (not actively wheezing). Chalsey Leeth change statin to atorvastatin given renal dysfunction and CAD. Per discussion with Dr. Curt Bears, need to see where renal function and HF symptomology go before committing to cath. Plan to hold home diuretics, obtain renal US and f/u labs in AM. I sent message to AM team inbox to have team round early to help dispo plan for cath or need for hydration. Mahari Vankirk obtain a f/u troponin.  2. Prior h/o NICM with elevated BNP/chronic combined CHF - clinically patient does not appear to be in significant HF but has elevated BNP. Given rise in BUN/Cr, Luberta Grabinski hold ACEI and diuretics and trend renal function. Check echo. Follow I/O's and daily weights. Pt received Lasix on 10/29 and BUN/Cr bumped with -6lb loss per her report. She is lying at 20 degrees without any orthopnea.  23. Acute kidney injury superimposed on CKD stage III - patient denies prior knowledge of dx. Hold ACEI, diuretic, spiro and trend. Plan renal US. May need renal input prior to cath if remains elevated above baseline. She has been intermittently hypotensive so hopeful this Robi Dewolfe improve with better renal perfusion once BP meds held.  4. Abnormalities noted on CT scan with ground glass nodules and thickening of gastric wall - Haydon Dorris review further eval with MD, Michell Giuliano certainly need OP workup.  5. Asthma - patient is not actively wheezing. Delonda Coley quickly taper prednisone over next few days as diagnosis likely more cardiac. Continue home Flovent.  6. Hyperlipidemia -  LDL 109. If the patient is tolerating statin at time of follow-up appointment, would consider rechecking liver function/lipid panel in 6-8 weeks.  For questions or updates, please contact [place] Please consult  [email or web address] for contact info under Cardiology/STEMI.    Signed, Charlie Pitter, PA-C  07/28/2018 12:02 PM   I have seen and examined this patient with Melina Copa.  Agree with above, note added to reflect my findings.  On exam, RRR, no murmurs, lungs clear. Patient with long story of SOB. Found to have elevated troponin on this hospital stay. Troponin 11. Currently chest pain free.   She Onie Hayashi need a LHC this hospitalization, but at this point, her creatinine is elevated. [facility] wait to see labs tomorrow for a decision on catheterization. She does not appear to be clinically in HF, but BNP is elevated. Filipe Greathouse hold ACEI, aldactone, diuresis.  Torie Priebe M. Leeloo Silverthorne MD 07/28/2018 1:48 PM

## 2018-07-28 NOTE — Progress Notes (Signed)
CRITICAL VALUE ALERT  Critical Value: Troponin 10.03  Date & Time Notied:  07/28/2018 @ Time 13.55  Provider Notified: Idolina Primer, PA  Orders Received/Actions taken: Continue same order status, no new order received.  Kennyth Lose, RN

## 2018-07-28 NOTE — Progress Notes (Addendum)
ANTICOAGULATION CONSULT NOTE - [place] for heparin dosing Indication:  ACS/NSTEMI  No Known Allergies  Patient Measurements: Height: 5\' 9"  (175.3 cm) Weight: 192 lb 14.4 oz (87.5 kg) IBW/kg (Calculated) : 66.2 Heparin Dosing Weight: HEPARIN DW (KG): 84.2   Vital Signs: BP: 100/60 (11/03 0800) Pulse Rate: 64 (11/03 0800)  Labs: Recent Labs    07/26/18 1543 07/27/18 1904 07/27/18 2323 07/28/18 0526  HGB 12.1 11.5*  --  11.1*  HCT 39.7 37.4  --  36.2  PLT 164 162  --  150  HEPARINUNFRC  --  <0.10*  --  0.66  CREATININE 2.22* 2.16*  --   --   TROPONINI  --  11.85* 11.13*  --     Estimated Creatinine Clearance: 22.5 mL/min (A) (by C-G formula based on SCr of 2.16 mg/dL (H)).    Assessment: Pharmacy consulted to dose heparin for this 62 yof with ACS/NSTEMI.  Troponin I is elevated at 11.85.  She doesn't appear to have been on any  anti-coagulants prior to admission. Heparin level this morning is within therapeutic goal range at 0.66 IU/mL.  Goal of Therapy:  Heparin level 0.3-0.7 units/ml Monitor platelets by anticoagulation protocol: Yes   Plan:  Continue heparin infusion at 1000 units/hr Check anti-Xa level in 6-8 hours and daily while on heparin Continue to monitor H&H and platelets  Despina Pole 07/28/2018,8:50 AM

## 2018-07-28 NOTE — Progress Notes (Addendum)
ANTICOAGULATION CONSULT NOTE  Pharmacy Consult for heparin dosing Indication:  ACS/NSTEMI  No Known Allergies  Patient Measurements: Height: 5\' 9"  (175.3 cm) Weight: 186 lb 3.2 oz (84.5 kg) IBW/kg (Calculated) : 66.2 Heparin Dosing Weight: 87.5kg   Vital Signs: Temp: 98 F (36.7 C) (11/03 0935) Temp Source: Oral (11/03 0935) BP: 123/88 (11/03 1349) Pulse Rate: 67 (11/03 1349)  Labs: Recent Labs    07/26/18 1543 07/27/18 1904 07/27/18 2323 07/28/18 0526 07/28/18 1350  HGB 12.1 11.5*  --  11.1*  --   HCT 39.7 37.4  --  36.2  --   PLT 164 162  --  150  --   HEPARINUNFRC  --  <0.10*  --  0.66 0.20*  CREATININE 2.22* 2.16*  --   --   --   TROPONINI  --  11.85* 11.13*  --   --     Estimated Creatinine Clearance: 22.1 mL/min (A) (by C-G formula based on SCr of 2.16 mg/dL (H)).   Medical History: Past Medical History:  Diagnosis Date  . Asthma   . Breast cancer ([place])   . Coronary atherosclerosis    Cardiac catheterization 2004 with 60% mid LAD  . DVT (deep venous thrombosis) ([place])   . Essential hypertension   . History of breast cancer 1987   Right-sided, mastectomy with axillary node dissection and Adriamycin   . History of thyroid cancer   . Hodgkin lymphoma ([place]) 04/03/2017  . Hodgkin's disease ([place])   . Hypothyroidism   . Nonischemic cardiomyopathy ([facility])    LVEF improved to 50-55% as of 2014  . Obesity   . Osteoporosis   . Peripheral arterial disease ([place])    Stent to the left common iliac in 2006  . Thyroid carcinoma ([place]) 04/03/2017    Assessment: 3 yof with ACS/NSTEMI. Pharmacy dosing heparin  -heparin level down to 0.2 on 100 units/hr  Goal of Therapy:  Heparin level 0.3-0.7 units/ml Monitor platelets by anticoagulation protocol: Yes   Plan -heparin bolus 1000 units then increase to 1150units/hr -Heparin level in 8 hours and daily wth CBC daily  Hildred Laser, PharmD Clinical Pharmacist **Pharmacist phone directory can now be found on  [email or web address] ([email or web address]).  Listed under [place].

## 2018-07-28 NOTE — Telephone Encounter (Signed)
Opened in error

## 2018-07-28 NOTE — Progress Notes (Signed)
Pt's living situation is home alone. I called her son, Mr. Brexlee Heberlein, who is her healthcare power attorney to give him an update and requested him to fax the Pt's living will or an authorization showing him as a healthcare power attorney. He will send to [address] fax number. Her son lives in [place]. Pt requests MD to talk with her son because she cannot make any decision about any plan of care and invasive procedure by herself.  Mr. Lyvia Mondesir 's number is [phone].   Kennyth Lose, RN

## 2018-07-29 ENCOUNTER — Encounter (HOSPITAL_COMMUNITY): Payer: Self-pay | Admitting: General Practice

## 2018-07-29 ENCOUNTER — Inpatient Hospital Stay (HOSPITAL_COMMUNITY): Payer: Medicare HMO

## 2018-07-29 DIAGNOSIS — I5030 Unspecified diastolic (congestive) heart failure: Secondary | ICD-10-CM

## 2018-07-29 LAB — MAGNESIUM: Magnesium: 1.9 mg/dL (ref 1.7–2.4)

## 2018-07-29 LAB — HEPARIN LEVEL (UNFRACTIONATED)
Heparin Unfractionated: 0.35 IU/mL (ref 0.30–0.70)
Heparin Unfractionated: 0.79 IU/mL — ABNORMAL HIGH (ref 0.30–0.70)
Heparin Unfractionated: 0.89 IU/mL — ABNORMAL HIGH (ref 0.30–0.70)

## 2018-07-29 LAB — BASIC METABOLIC PANEL
Anion gap: 10 (ref 5–15)
BUN: 46 mg/dL — AB (ref 8–23)
CALCIUM: 5.8 mg/dL — AB (ref 8.9–10.3)
CO2: 20 mmol/L — AB (ref 22–32)
CREATININE: 1.83 mg/dL — AB (ref 0.44–1.00)
Chloride: 108 mmol/L (ref 98–111)
GFR calc Af Amer: 28 mL/min — ABNORMAL LOW (ref >60)
GFR calc non Af Amer: 24 mL/min — ABNORMAL LOW (ref >60)
Glucose, Bld: 82 mg/dL (ref 70–99)
Potassium: 4.2 mmol/L (ref 3.5–5.1)
Sodium: 138 mmol/L (ref 135–145)

## 2018-07-29 LAB — ECHOCARDIOGRAM COMPLETE
HEIGHTINCHES: 69 in
WEIGHTICAEL: 3015.89 [oz_av]

## 2018-07-29 LAB — PHOSPHORUS: Phosphorus: 7.5 mg/dL — ABNORMAL HIGH (ref 2.5–4.6)

## 2018-07-29 LAB — ALBUMIN: Albumin: 2.9 g/dL — ABNORMAL LOW (ref 3.5–5.0)

## 2018-07-29 MED ORDER — CALCIUM GLUCONATE-NACL 1-0.675 GM/50ML-% IV SOLN
1.0000 g | Freq: Once | INTRAVENOUS | Status: AC
  Administered 2018-07-29: 1000 mg via INTRAVENOUS
  Filled 2018-07-29: qty 50

## 2018-07-29 NOTE — Progress Notes (Signed)
  Echocardiogram 2D Echocardiogram has been performed.  [name] F 07/29/2018, 3:08 PM

## 2018-07-29 NOTE — Progress Notes (Signed)
CRITICAL VALUE ALERT  Critical Value: Calcium 5.8  Date & Time Notied:  07/29/18 @ 6am  Provider Notified: Dr. Dorene Ar  Orders Received/Actions taken: Orders received

## 2018-07-29 NOTE — Progress Notes (Signed)
ANTICOAGULATION CONSULT NOTE  Pharmacy Consult for heparin dosing Indication:  ACS/NSTEMI  No Known Allergies  Patient Measurements: Height: 5\' 9"  (175.3 cm) Weight: 186 lb 3.2 oz (84.5 kg) IBW/kg (Calculated) : 66.2 Heparin Dosing Weight: 87.5kg   Vital Signs: Temp: 97.6 F (36.4 C) (11/04 0018) Temp Source: Oral (11/04 0018) BP: 86/69 (11/04 0018) Pulse Rate: 65 (11/04 0018)  Labs: Recent Labs    07/26/18 1543  07/27/18 1904 07/27/18 2323 07/28/18 0526 07/28/18 1350 07/28/18 2346  HGB 12.1  --  11.5*  --  11.1*  --   --   HCT 39.7  --  37.4  --  36.2  --   --   PLT 164  --  162  --  150  --   --   HEPARINUNFRC  --    < > <0.10*  --  0.66 0.20* 0.89*  CREATININE 2.22*  --  2.16*  --   --   --   --   TROPONINI  --   --  11.85* 11.13*  --  10.03*  --    < > = values in this interval not displayed.    Estimated Creatinine Clearance: 22.1 mL/min (A) (by C-G formula based on SCr of 2.16 mg/dL (H)).   Assessment: 48 yof with ACS/NSTEMI. Pharmacy dosing heparin  -heparin level now elevated at 0.89 units/ml  Goal of Therapy:  Heparin level 0.3-0.7 units/ml Monitor platelets by anticoagulation protocol: Yes   Plan -Decrease heparin to 1000 units/hr -Check heparin level in 6-8 hours  Thanks for allowing pharmacy to be a part of this patient's care.  Excell Seltzer, PharmD Clinical Pharmacist

## 2018-07-29 NOTE — Progress Notes (Addendum)
Progress Note  Patient Name: [name] Date of Encounter: 07/29/2018  Primary Cardiologist: Rozann Lesches, MD  Subjective   Feeling better this morning. Breathing has improved.   Inpatient Medications    Scheduled Meds: . aspirin  81 mg Oral Daily  . atorvastatin  80 mg Oral q1800  . budesonide  0.25 mg Nebulization BID  . carvedilol  3.125 mg Oral BID  . levothyroxine  100 mcg Oral Q0600  . predniSONE  10 mg Oral Q breakfast   Followed by  . [START ON 07/31/2018] predniSONE  5 mg Oral Q breakfast  . sodium chloride flush  3 mL Intravenous Q12H  . temazepam  30 mg Oral QHS   Continuous Infusions: . sodium chloride    . heparin 850 Units/hr (07/29/18 0825)   PRN Meds: sodium chloride, acetaminophen, nitroGLYCERIN, ondansetron (ZOFRAN) IV, sodium chloride flush   Vital Signs    Vitals:   07/29/18 0018 07/29/18 0446 07/29/18 0840 07/29/18 0919  BP: (!) 86/69 (!) 103/56 105/62 104/67  Pulse: 65 61 (!) 59 64  Resp: 19 (!) 26 20   Temp: 97.6 F (36.4 C) 97.6 F (36.4 C) 97.9 F (36.6 C)   TempSrc: Oral Oral Oral   SpO2: 100% 100% 100%   Weight:  85.5 kg    Height:        Intake/Output Summary (Last 24 hours) at 07/29/2018 1102 Last data filed at 07/29/2018 0451 Gross per 24 hour  Intake 601.97 ml  Output 500 ml  Net 101.97 ml   Filed Weights   07/27/18 2030 07/28/18 0935 07/29/18 0446  Weight: 87.5 kg 84.5 kg 85.5 kg    Telemetry    SR with PVCs - Personally Reviewed  ECG    N/a - Personally Reviewed  Physical Exam   General: Well developed, well nourished, female appearing in no acute distress. Head: Normocephalic, atraumatic.  Neck: Supple without bruits, JVD. Lungs:  Resp regular and unlabored, CTA. Heart: RRR, S1, S2, no murmur; no rub. Abdomen: Soft, non-tender, non-distended with normoactive bowel sounds. Extremities: No clubbing, cyanosis, edema. Distal pedal pulses are 2+ bilaterally. Neuro: Alert and oriented X 3. Moves all  extremities spontaneously. Psych: Normal affect.  Labs    Chemistry Recent Labs  Lab 07/23/18 1404  07/26/18 1543 07/27/18 1904 07/29/18 0429  NA 138   < > 138 139 138  K 4.7   < > 4.5 4.7 4.2  CL 104   < > 104 104 108  CO2 24  --  21* 23 20*  GLUCOSE 180*   < > 132* 131* 82  BUN 29*   < > 47* 52* 46*  CREATININE 1.73*   < > 2.22* 2.16* 1.83*  CALCIUM 7.4*  --  6.6* 6.1* 5.8*  PROT 7.9  --   --  7.4  --   ALBUMIN 3.8  --   --  3.5  --   AST 28  --   --  35  --   ALT 21  --   --  26  --   ALKPHOS 64  --   --  50  --   BILITOT 1.0  --   --  0.6  --   GFRNONAA 26*  --  19* 20* 24*  GFRAA 30*  --  22* 23* 28*  ANIONGAP 10  --  13 12 10    < > = values in this interval not displayed.     Hematology Recent Labs  Lab 07/26/18 1543 07/27/18 1904 07/28/18 0526  WBC 8.7 7.0 7.8  RBC 4.45 4.19 3.96  HGB 12.1 11.5* 11.1*  HCT 39.7 37.4 36.2  MCV 89.2 89.3 91.4  MCH 27.2 27.4 28.0  MCHC 30.5 30.7 30.7  RDW 15.4 15.6* 15.4  PLT 164 162 150    Cardiac Enzymes Recent Labs  Lab 07/27/18 1904 07/27/18 2323 07/28/18 1350  TROPONINI 11.85* 11.13* 10.03*    Recent Labs  Lab 07/23/18 1352  TROPIPOC 0.07     BNP Recent Labs  Lab 07/23/18 1404 07/26/18 1543 07/27/18 1904  BNP 681.0* 1,074.0* 899.0*     DDimer No results for input(s): DDIMER in the last 168 hours.    Radiology    Dg Chest 2 View  Result Date: 07/27/2018 CLINICAL DATA:  Weakness and shortness of breath. EXAM: CHEST - 2 VIEW COMPARISON:  CT chest from yesterday. FINDINGS: Stable cardiomegaly. Normal pulmonary vascularity. Atherosclerotic calcification of the aortic arch. No focal consolidation, pleural effusion, or pneumothorax. No acute osseous abnormality. Unchanged surgical clips in the right axilla. IMPRESSION: No active cardiopulmonary disease. Electronically Signed   By: Titus Dubin M.D.   On: 07/27/2018 19:24   US Renal  Result Date: 07/29/2018 CLINICAL DATA:  84 year old female with  acute kidney injury superimposed on chronic kidney disease EXAM: RENAL / URINARY TRACT ULTRASOUND COMPLETE COMPARISON:  None. FINDINGS: Right Kidney: Renal measurements: 8.9 x 3.9 x 4.7 cm = volume: 86 mL. Increased parenchymal echogenicity. There is an echogenic focus in the lower pole with posterior acoustic shadowing which measures approximately 1.6 cm. The architecture of this abnormality is not well evaluated secondary to the shadowing artifact, however it appears somewhat rounded and central within the kidney in the region of the collecting system. No evidence of hydronephrosis. Left Kidney: Renal measurements: 8.7 x 4.6 x 4.0 cm = volume: 84 mL. Increased parenchymal echogenicity. No mass or hydronephrosis visualized. Bladder: Appears normal for degree of bladder distention. IMPRESSION: 1. Large (1.6 cm) right inferior pelvic calcification/stone versus peripherally calcified central renal mass. Recommend further evaluation with CT scan of the abdomen and pelvis. 2. Echogenic kidneys bilaterally consistent with underlying medical renal disease. Electronically Signed   By: Jacqulynn Cadet M.D.   On: 07/29/2018 08:22    Cardiac Studies   N/a  Patient Profile     84 y.o. female with a hx of nonobstructive CAD by cath 2004 (60% LAD), presumed NICM EF 40%, asthma, breast CA (mastectomy/Adriamycin), Hodgkin's disease, obesity, PAD s/p stent to LCIA 2006, thyroid carcinoma, CKD stage III by labs (unknown to patient) who was seen for the evaluation of NSTEMI.   Assessment & Plan    1. Shortness of breath and probable NSTEMI with prior nonobstructive CAD (suspect now obstructive) - troponins have clearly ruled in. Will continue ASA, heparin, and BB. Statin was switched to atorvastatin. Will need cardiac catheterization during this hospitalization but Cr remains elevated and Ca++ is low today. Will defer cath today.   2. Prior h/o NICM with elevated BNP/chronic combined CHF - clinically patient does  not appear to be in significant HF but has elevated BNP. Given rise in BUN/Cr, will hold ACEI and diuretics and trend renal function. Check echo.   -- Follow I/O's and daily weights. Breathing is much improved.  3. Acute kidney injury superimposed on CKD stage III - patient denies prior knowledge of dx. Hold ACEI, diuretic, spiro and trend. May need renal input prior to cath if remains elevated above baseline and Ca++  remains low. Had a renal US noting large 1.6cm right inferior pelvic calcification vs renal mass. Recommendation for follow up with CT scan of abd/pelvis. Discuss with MD regarding timing given her Cr.  -- BMET in the am  4. Abnormalities noted on CT scan with ground glass nodules and thickening of gastric wall - will review further eval with MD, will certainly need OP workup.  5. Asthma - no active wheezing noted. Quickly taper prednisone over next few days as diagnosis likely more cardiac. Continue home Flovent.  6. Hyperlipidemia - LDL 109. If the patient is tolerating statin at time of follow-up appointment, would consider rechecking liver function/lipid panel in 6-8 weeks.  7. Hypocalcemia: 7.4>>5.8 since admission. Denies any hx of the same. Recheck Ionized Ca score today. Replace. May need renal input if no improvement.  -- check mag, Albumin, phosphorus  Signed, Reino Bellis, NP  07/29/2018, 11:02 AM  Pager # [phone]    Patient seen and examined. Agree with assessment and plan.   Troy Sine, MD, [facility] 07/29/2018 11:46 AM   For questions or updates, please contact [place] Please consult [email or web address] for contact info under Cardiology/STEMI.   Patient seen and examined. Agree with assessment and plan. No recurrent chest pain. Ca 5.8; ionized Ca sent to re-assess. Will check albumin, Phos, Mg. With abnormal [place] consider renal evaluation. Defer cath today.     Troy Sine, MD, [facility] 07/29/2018 11:40 AM

## 2018-07-29 NOTE — Progress Notes (Signed)
ANTICOAGULATION CONSULT NOTE  Pharmacy Consult for heparin dosing Indication:  ACS/NSTEMI  No Known Allergies  Patient Measurements: Height: 5\' 9"  (175.3 cm) Weight: 188 lb 7.9 oz (85.5 kg) IBW/kg (Calculated) : 66.2 Heparin Dosing Weight: 87.5kg   Vital Signs: Temp: 98.1 F (36.7 C) (11/04 1627) Temp Source: Oral (11/04 1627) BP: 128/82 (11/04 1627) Pulse Rate: 69 (11/04 1627)  Labs: Recent Labs    07/27/18 1904 07/27/18 2323 07/28/18 0526 07/28/18 1350 07/28/18 2346 07/29/18 0429 07/29/18 0722 07/29/18 1608  HGB 11.5*  --  11.1*  --   --   --   --   --   HCT 37.4  --  36.2  --   --   --   --   --   PLT 162  --  150  --   --   --   --   --   HEPARINUNFRC <0.10*  --  0.66 0.20* 0.89*  --  0.79* 0.35  CREATININE 2.16*  --   --   --   --  1.83*  --   --   TROPONINI 11.85* 11.13*  --  10.03*  --   --   --   --     Estimated Creatinine Clearance: 26.2 mL/min (A) (by C-G formula based on SCr of 1.83 mg/dL (H)).   Assessment: 60 yof with ACS/NSTEMI - no anticoag PTA. Pharmacy dosing heparin.  Heparin level came back therapeutic at 0.35. No s/sx of bleeding. No infusion issues.   Goal of Therapy:  Heparin level 0.3-0.7 units/ml Monitor platelets by anticoagulation protocol: Yes   Plan -Continue heparin at 850 units/hr -Will check HL in am unless plan for cardiac cath prior -Monitor daily HL, CBC, and for s/sx of bleeding  Thanks for allowing pharmacy to be a part of this patient's care.  Alanda Slim, PharmD, [facility] Clinical Pharmacist Please see AMION for all Pharmacists' Contact Phone Numbers 07/29/2018, 6:21 PM

## 2018-07-29 NOTE — Progress Notes (Signed)
Ca+ level 5.8, I ordered stat ionized calcium

## 2018-07-29 NOTE — Progress Notes (Signed)
ANTICOAGULATION CONSULT NOTE  Pharmacy Consult for heparin dosing Indication:  ACS/NSTEMI  No Known Allergies  Patient Measurements: Height: 5\' 9"  (175.3 cm) Weight: 188 lb 7.9 oz (85.5 kg) IBW/kg (Calculated) : 66.2 Heparin Dosing Weight: 87.5kg   Vital Signs: Temp: 97.6 F (36.4 C) (11/04 0446) Temp Source: Oral (11/04 0446) BP: 103/56 (11/04 0446) Pulse Rate: 61 (11/04 0446)  Labs: Recent Labs    07/26/18 1543  07/27/18 1904 07/27/18 2323 07/28/18 0526 07/28/18 1350 07/28/18 2346 07/29/18 0429 07/29/18 0722  HGB 12.1  --  11.5*  --  11.1*  --   --   --   --   HCT 39.7  --  37.4  --  36.2  --   --   --   --   PLT 164  --  162  --  150  --   --   --   --   HEPARINUNFRC  --    < > <0.10*  --  0.66 0.20* 0.89*  --  0.79*  CREATININE 2.22*  --  2.16*  --   --   --   --  1.83*  --   TROPONINI  --   --  11.85* 11.13*  --  10.03*  --   --   --    < > = values in this interval not displayed.    Estimated Creatinine Clearance: 26.2 mL/min (A) (by C-G formula based on SCr of 1.83 mg/dL (H)).   Assessment: 48 yof with ACS/NSTEMI - no anticoag PTA. Pharmacy dosing heparin.  Heparin level came back supratherapeutic at 0.79, on 1000 units/hr. Hgb 11.1, plt 150 on last check on 11/3. No s/sx of bleeding. No infusion issues. Heparin infusion is running in L forearm; heparin level drawn from opposite side appropriately.   Goal of Therapy:  Heparin level 0.3-0.7 units/ml Monitor platelets by anticoagulation protocol: Yes   Plan -Decrease heparin to 850 units/hr -Will check HL in 8 hr unless plan for cardiac cath prior -Monitor daily HL, CBC, and for s/sx of bleeding  Thanks for allowing pharmacy to be a part of this patient's care.  Doylene Canard, PharmD Clinical Pharmacist  Pager: [phone] Phone: [phone]

## 2018-07-30 ENCOUNTER — Encounter (HOSPITAL_COMMUNITY)
Admission: EM | Disposition: A | Discharge status: Home / Self Care | Payer: Self-pay | Source: Home / Self Care | Attending: Cardiology

## 2018-07-30 DIAGNOSIS — I2510 Atherosclerotic heart disease of native coronary artery without angina pectoris: Secondary | ICD-10-CM

## 2018-07-30 DIAGNOSIS — N189 Chronic kidney disease, unspecified: Secondary | ICD-10-CM

## 2018-07-30 DIAGNOSIS — N179 Acute kidney failure, unspecified: Secondary | ICD-10-CM

## 2018-07-30 HISTORY — PX: LEFT HEART CATH AND CORONARY ANGIOGRAPHY: CATH118249

## 2018-07-30 LAB — BASIC METABOLIC PANEL
Anion gap: 8 (ref 5–15)
BUN: 37 mg/dL — AB (ref 8–23)
CHLORIDE: 108 mmol/L (ref 98–111)
CO2: 24 mmol/L (ref 22–32)
CREATININE: 1.62 mg/dL — AB (ref 0.44–1.00)
Calcium: 6.4 mg/dL — CL (ref 8.9–10.3)
GFR calc Af Amer: 32 mL/min — ABNORMAL LOW (ref >60)
GFR calc non Af Amer: 28 mL/min — ABNORMAL LOW (ref >60)
Glucose, Bld: 94 mg/dL (ref 70–99)
POTASSIUM: 4.2 mmol/L (ref 3.5–5.1)
Sodium: 140 mmol/L (ref 135–145)

## 2018-07-30 LAB — CBC
HEMATOCRIT: 34 % — AB (ref 36.0–46.0)
Hemoglobin: 10.5 g/dL — ABNORMAL LOW (ref 12.0–15.0)
MCH: 27.6 pg (ref 26.0–34.0)
MCHC: 30.9 g/dL (ref 30.0–36.0)
MCV: 89.2 fL (ref 80.0–100.0)
Platelets: 175 10*3/uL (ref 150–400)
RBC: 3.81 MIL/uL — AB (ref 3.87–5.11)
RDW: 15.1 % (ref 11.5–15.5)
WBC: 6.6 10*3/uL (ref 4.0–10.5)
nRBC: 0.3 % — ABNORMAL HIGH (ref 0.0–0.2)

## 2018-07-30 LAB — HEPARIN LEVEL (UNFRACTIONATED): HEPARIN UNFRACTIONATED: 0.32 [IU]/mL (ref 0.30–0.70)

## 2018-07-30 LAB — PARATHYROID HORMONE, INTACT (NO CA): PTH: 17 pg/mL (ref 15–65)

## 2018-07-30 LAB — CALCIUM, IONIZED: Calcium, Ionized, Serum: 3.1 mg/dL — ABNORMAL LOW (ref 4.5–5.6)

## 2018-07-30 SURGERY — LEFT HEART CATH AND CORONARY ANGIOGRAPHY
Anesthesia: LOCAL

## 2018-07-30 MED ORDER — ACETAMINOPHEN 325 MG PO TABS: 650.0000 mg | ORAL_TABLET | ORAL | Status: DC | PRN

## 2018-07-30 MED ORDER — SODIUM CHLORIDE 0.9% FLUSH
3.0000 mL | Freq: Two times a day (BID) | INTRAVENOUS | Status: DC
  Administered 2018-07-30: 3 mL via INTRAVENOUS

## 2018-07-30 MED ORDER — HEPARIN SODIUM (PORCINE) 1000 UNIT/ML IJ SOLN
INTRAMUSCULAR | Status: DC | PRN
  Administered 2018-07-30: 4000 [IU] via INTRAVENOUS

## 2018-07-30 MED ORDER — VERAPAMIL HCL 2.5 MG/ML IV SOLN
INTRAVENOUS | Status: AC
  Filled 2018-07-30: qty 2

## 2018-07-30 MED ORDER — HEPARIN (PORCINE) IN NACL 1000-0.9 UT/500ML-% IV SOLN
INTRAVENOUS | Status: AC
  Filled 2018-07-30: qty 1000

## 2018-07-30 MED ORDER — CALCIUM CARBONATE-VITAMIN D 500-200 MG-UNIT PO TABS
1.0000 | ORAL_TABLET | Freq: Three times a day (TID) | ORAL | Status: DC
  Administered 2018-07-30 – 2018-08-01 (×7): 1 via ORAL
  Filled 2018-07-30 (×8): qty 1

## 2018-07-30 MED ORDER — ASPIRIN 81 MG PO CHEW
81.0000 mg | CHEWABLE_TABLET | Freq: Every day | ORAL | Status: DC
  Administered 2018-07-31 – 2018-08-01 (×2): 81 mg via ORAL
  Filled 2018-07-30 (×2): qty 1

## 2018-07-30 MED ORDER — SODIUM CHLORIDE 0.9% FLUSH: 3.0000 mL | INTRAVENOUS | Status: DC | PRN

## 2018-07-30 MED ORDER — SODIUM CHLORIDE 0.9% FLUSH
3.0000 mL | Freq: Two times a day (BID) | INTRAVENOUS | Status: DC
  Administered 2018-07-31 – 2018-08-01 (×2): 3 mL via INTRAVENOUS

## 2018-07-30 MED ORDER — SODIUM CHLORIDE 0.9 % IV SOLN: 250.0000 mL | INTRAVENOUS | Status: DC | PRN

## 2018-07-30 MED ORDER — IOHEXOL 350 MG/ML SOLN
INTRAVENOUS | Status: DC | PRN
  Administered 2018-07-30: 60 mL via INTRA_ARTERIAL

## 2018-07-30 MED ORDER — FENTANYL CITRATE (PF) 100 MCG/2ML IJ SOLN
INTRAMUSCULAR | Status: AC
  Filled 2018-07-30: qty 2

## 2018-07-30 MED ORDER — LIDOCAINE HCL (PF) 1 % IJ SOLN
INTRAMUSCULAR | Status: DC | PRN
  Administered 2018-07-30: 2 mL via INTRADERMAL

## 2018-07-30 MED ORDER — HEPARIN SODIUM (PORCINE) 5000 UNIT/ML IJ SOLN
5000.0000 [IU] | Freq: Three times a day (TID) | INTRAMUSCULAR | Status: DC
  Administered 2018-07-30 – 2018-08-01 (×6): 5000 [IU] via SUBCUTANEOUS
  Filled 2018-07-30 (×6): qty 1

## 2018-07-30 MED ORDER — VERAPAMIL HCL 2.5 MG/ML IV SOLN
INTRAVENOUS | Status: DC | PRN
  Administered 2018-07-30: 10 mL via INTRA_ARTERIAL

## 2018-07-30 MED ORDER — SODIUM CHLORIDE 0.9 % IV SOLN
INTRAVENOUS | Status: DC
  Administered 2018-07-30: 14:00:00 via INTRAVENOUS

## 2018-07-30 MED ORDER — HEPARIN (PORCINE) IN NACL 1000-0.9 UT/500ML-% IV SOLN
INTRAVENOUS | Status: DC | PRN
  Administered 2018-07-30 (×2): 500 mL

## 2018-07-30 MED ORDER — MIDAZOLAM HCL 2 MG/2ML IJ SOLN
INTRAMUSCULAR | Status: DC | PRN
  Administered 2018-07-30: 0.5 mg via INTRAVENOUS

## 2018-07-30 MED ORDER — MIDAZOLAM HCL 2 MG/2ML IJ SOLN
INTRAMUSCULAR | Status: AC
  Filled 2018-07-30: qty 2

## 2018-07-30 MED ORDER — ONDANSETRON HCL 4 MG/2ML IJ SOLN: 4.0000 mg | Freq: Four times a day (QID) | INTRAMUSCULAR | Status: DC | PRN

## 2018-07-30 MED ORDER — FENTANYL CITRATE (PF) 100 MCG/2ML IJ SOLN
INTRAMUSCULAR | Status: DC | PRN
  Administered 2018-07-30 (×2): 25 ug via INTRAVENOUS

## 2018-07-30 MED ORDER — SODIUM CHLORIDE 0.9 % IV SOLN: INTRAVENOUS | Status: AC

## 2018-07-30 MED ORDER — LIDOCAINE HCL (PF) 1 % IJ SOLN
INTRAMUSCULAR | Status: AC
  Filled 2018-07-30: qty 30

## 2018-07-30 SURGICAL SUPPLY — 11 items
CATH 5FR JL3.5 JR4 ANG PIG MP (CATHETERS) ×2 IMPLANT
DEVICE RAD COMP TR BAND LRG (VASCULAR PRODUCTS) ×2 IMPLANT
GLIDESHEATH SLEND A-KIT 6F 22G (SHEATH) ×2 IMPLANT
GUIDEWIRE INQWIRE 1.5J.035X260 (WIRE) ×1 IMPLANT
INQWIRE 1.5J .035X260CM (WIRE) ×2
KIT HEART LEFT (KITS) ×2 IMPLANT
PACK CARDIAC CATHETERIZATION (CUSTOM PROCEDURE TRAY) ×2 IMPLANT
SHEATH PROBE COVER 6X72 (BAG) ×2 IMPLANT
TRANSDUCER W/STOPCOCK (MISCELLANEOUS) ×2 IMPLANT
TUBING CIL FLEX 10 FLL-RA (TUBING) ×2 IMPLANT
WIRE HI TORQ VERSACORE-J 145CM (WIRE) ×2 IMPLANT

## 2018-07-30 NOTE — Progress Notes (Addendum)
Progress Note  Patient Name: [name] Date of Encounter: 07/30/2018  Primary Cardiologist: Rozann Lesches, MD  Subjective   No chest pain  Inpatient Medications    Scheduled Meds: . aspirin  81 mg Oral Daily  . atorvastatin  80 mg Oral q1800  . budesonide  0.25 mg Nebulization BID  . carvedilol  3.125 mg Oral BID  . levothyroxine  100 mcg Oral Q0600  . [START ON 07/31/2018] predniSONE  5 mg Oral Q breakfast  . sodium chloride flush  3 mL Intravenous Q12H  . temazepam  30 mg Oral QHS   Continuous Infusions: . sodium chloride    . heparin 900 Units/hr (07/30/18 0912)   PRN Meds: sodium chloride, acetaminophen, nitroGLYCERIN, ondansetron (ZOFRAN) IV, sodium chloride flush   Vital Signs    Vitals:   07/30/18 0142 07/30/18 0424 07/30/18 0834 07/30/18 0842  BP:  (!) 101/53 119/76   Pulse: (!) 58 (!) 57 69   Resp: 11 16    Temp:  (!) 97.4 F (36.3 C)    TempSrc:  Oral    SpO2: 98% 100%  99%  Weight: 85.8 kg     Height:        Intake/Output Summary (Last 24 hours) at 07/30/2018 1256 Last data filed at 07/30/2018 0800 Gross per 24 hour  Intake 1285.67 ml  Output 650 ml  Net 635.67 ml   Filed Weights   07/28/18 0935 07/29/18 0446 07/30/18 0142  Weight: 84.5 kg 85.5 kg 85.8 kg    Telemetry    SR with PVCs - Personally Reviewed  ECG    N/a - Personally Reviewed  Physical Exam   General: Well developed, well nourished, female in no acute distress Head: Eyes PERRLA, No xanthomas.   Normocephalic and atraumatic Lungs: Clear bilaterally to auscultation. Heart: HRRR S1 S2, without MRG.  Pulses are 2+ & equal. No JVD. Abdomen: Bowel sounds are present, abdomen soft and non-tender without masses or  hernias noted. Msk: Normal strength and tone for age. Extremities: No clubbing, cyanosis or edema.    Skin:  No rashes or lesions noted. Neuro: Alert and oriented X 3. Psych:  Good affect, responds appropriately  General: Well developed, well  nourished, female appearing in no acute distress. Head: Normocephalic, atraumatic.  Neck: Supple without bruits, JVD. Lungs:  Resp regular and unlabored, CTA. Heart: RRR, S1, S2, no murmur; no rub. Abdomen: Soft, non-tender, non-distended with normoactive bowel sounds. Extremities: No clubbing, cyanosis, edema. Distal pedal pulses are 2+ bilaterally. Neuro: Alert and oriented X 3. Moves all extremities spontaneously. Psych: Normal affect.  Labs    Chemistry Recent Labs  Lab 07/23/18 1404  07/27/18 1904 07/29/18 0429 07/29/18 0722 07/30/18 0606  NA 138   < > 139 138  --  140  K 4.7   < > 4.7 4.2  --  4.2  CL 104   < > 104 108  --  108  CO2 24   < > 23 20*  --  24  GLUCOSE 180*   < > 131* 82  --  94  BUN 29*   < > 52* 46*  --  37*  CREATININE 1.73*   < > 2.16* 1.83*  --  1.62*  CALCIUM 7.4*   < > 6.1* 5.8*  --  6.4*  PROT 7.9  --  7.4  --   --   --   ALBUMIN 3.8  --  3.5  --  2.9*  --  AST 28  --  35  --   --   --   ALT 21  --  26  --   --   --   ALKPHOS 64  --  50  --   --   --   BILITOT 1.0  --  0.6  --   --   --   GFRNONAA 26*   < > 20* 24*  --  28*  GFRAA 30*   < > 23* 28*  --  32*  ANIONGAP 10   < > 12 10  --  8   < > = values in this interval not displayed.     Hematology Recent Labs  Lab 07/27/18 1904 07/28/18 0526 07/30/18 0309  WBC 7.0 7.8 6.6  RBC 4.19 3.96 3.81*  HGB 11.5* 11.1* 10.5*  HCT 37.4 36.2 34.0*  MCV 89.3 91.4 89.2  MCH 27.4 28.0 27.6  MCHC 30.7 30.7 30.9  RDW 15.6* 15.4 15.1  PLT 162 150 175    Cardiac Enzymes Recent Labs  Lab 07/27/18 1904 07/27/18 2323 07/28/18 1350  TROPONINI 11.85* 11.13* 10.03*    No results for input(s): TROPIPOC in the last 168 hours.   BNP Recent Labs  Lab 07/23/18 1404 07/26/18 1543 07/27/18 1904  BNP 681.0* 1,074.0* 899.0*      Radiology    US Renal  Result Date: 07/29/2018 CLINICAL DATA:  84 year old female with acute kidney injury superimposed on chronic kidney disease EXAM: RENAL /  URINARY TRACT ULTRASOUND COMPLETE COMPARISON:  None. FINDINGS: Right Kidney: Renal measurements: 8.9 x 3.9 x 4.7 cm = volume: 86 mL. Increased parenchymal echogenicity. There is an echogenic focus in the lower pole with posterior acoustic shadowing which measures approximately 1.6 cm. The architecture of this abnormality is not well evaluated secondary to the shadowing artifact, however it appears somewhat rounded and central within the kidney in the region of the collecting system. No evidence of hydronephrosis. Left Kidney: Renal measurements: 8.7 x 4.6 x 4.0 cm = volume: 84 mL. Increased parenchymal echogenicity. No mass or hydronephrosis visualized. Bladder: Appears normal for degree of bladder distention. IMPRESSION: 1. Large (1.6 cm) right inferior pelvic calcification/stone versus peripherally calcified central renal mass. Recommend further evaluation with CT scan of the abdomen and pelvis. 2. Echogenic kidneys bilaterally consistent with underlying medical renal disease. Electronically Signed   By: Jacqulynn Cadet M.D.   On: 07/29/2018 08:22    Cardiac Studies   CATH: Ordered  Patient Profile     84 y.o. female with a hx of nonobstructive CAD by cath 2004 (60% LAD), presumed NICM EF 40%, asthma, breast CA (mastectomy/Adriamycin), Hodgkin's disease, obesity, PAD s/p stent to LCIA 2006, thyroid carcinoma, CKD stage III by labs (unknown to patient) who was seen for the evaluation of NSTEMI.   Assessment & Plan    1. Shortness of breath and probable NSTEMI with prior nonobstructive CAD (suspect now obstructive) -  -Peak troponin was 11.85 -On aspirin, heparin, high-dose statin and low-dose beta-blocker. -Systolic blood pressure 850 at times so no dose change. - Volume status is good, creatinine improved and calcium improved - Feel she is okay for cath today  2. Prior h/o NICM with elevated BNP/chronic combined CHF -  -Elevated BNP without significant volume overload on exam. - She was  87.5 kg on admission and is now 85.8. -Diuretics are on hold.  Not on ACE/ARB/Entresto at this time -she has had some IV fluids, but not a great deal -Continue to  follow daily weights and intake/output -Respiratory status stable  3. Acute kidney injury superimposed on CKD stage III  -Prior to admission she was on a low dose of Lasix 20 mg a day, lisinopril 5 mg a day, and Aldactone 12.5 mg a day - These meds are all on hold and her blood pressure is stable to low. -Creatinine has improved from admission, continue to follow  4. Abnormalities noted on CT scan with ground glass nodules and thickening of gastric wall plus pelvic mass-  - This will need follow-up and outpatient work-up, need to address cardiac issues first  5. Asthma -  - No active wheezing -She was on a Flovent inhaler at home, is currently on Pulmicort nebs twice daily - She got 1 dose of prednisone 10 mg and is to get 5 mg daily for 3 days   6. Hyperlipidemia -  -Prior to admission was on Zocor 40 mg a day -Currently on Lipitor 80 mg a day. -Recheck lipids and liver functions in 8 weeks  7. Hypocalcemia:  - Ionized calcium was checked 11/4 and was low at 3.1, no old values in the system. - PTH was borderline low at 17 - Her calcium level was low at 7.7 in 2016, 7.4 on admission and trended down from there to a nadir of 5.8. - She got 1 amp of calcium gluconate and her calcium level improved. -She was on calcium 600+ D daily prior to admission, restart this at 3 times daily.  This will help reduce her phosphorus level as well as improve her calcium -Magnesium was 1.9, phosphorus was elevated at 7.5 and albumin was low at 2.9. -Follow-up with PCP after discharge, may need further treatment  Signed, Rosaria Ferries, PA-C  07/30/2018, 12:56 PM  Pager # [phone]   For questions or updates, please contact [place] Please consult [email or web address] for contact info under Cardiology/STEMI.   Patient seen and  examined. Agree with assessment and plan. Echo shows LVEF at 35-40% with inferolateral akinesis and pseudo-dyskinesis of the posterior wall. Feels well without chest pain. Cr improved at 1.62.  OK for cath today with limited dye load, if possible. Troy Sine, MD, [facility] 07/30/2018 1:53 PM

## 2018-07-30 NOTE — Progress Notes (Addendum)
ANTICOAGULATION CONSULT NOTE  Pharmacy Consult for heparin dosing Indication:  ACS/NSTEMI  No Known Allergies  Patient Measurements: Height: 5\' 9"  (175.3 cm) Weight: 189 lb 2.5 oz (85.8 kg) IBW/kg (Calculated) : 66.2 Heparin Dosing Weight: 87.5kg   Vital Signs: Temp: 97.4 F (36.3 C) (11/05 0424) Temp Source: Oral (11/05 0424) BP: 101/53 (11/05 0424) Pulse Rate: 57 (11/05 0424)  Labs: Recent Labs    07/27/18 1904 07/27/18 2323 07/28/18 0526 07/28/18 1350  07/29/18 0429 07/29/18 0722 07/29/18 1608 07/30/18 0309 07/30/18 0606  HGB 11.5*  --  11.1*  --   --   --   --   --  10.5*  --   HCT 37.4  --  36.2  --   --   --   --   --  34.0*  --   PLT 162  --  150  --   --   --   --   --  175  --   HEPARINUNFRC <0.10*  --  0.66 0.20*   < >  --  0.79* 0.35 0.32  --   CREATININE 2.16*  --   --   --   --  1.83*  --   --   --  1.62*  TROPONINI 11.85* 11.13*  --  10.03*  --   --   --   --   --   --    < > = values in this interval not displayed.    Estimated Creatinine Clearance: 29.7 mL/min (A) (by C-G formula based on SCr of 1.62 mg/dL (H)).   Assessment: [name] with ACS/NSTEMI - no anticoag PTA. Pharmacy dosing heparin.  Heparin level came back therapeutic this morning at 0.32 (on lower end of goal range), on 850 units/hr. Hgb 10.5, plt 175. No s/sx of bleeding. No infusion issues. Heparin infusion is running in L forearm; heparin level drawn from opposite side appropriately.   Goal of Therapy:  Heparin level 0.3-0.7 units/ml Monitor platelets by anticoagulation protocol: Yes   Plan -Increase heparin infusion to 900 units/hr to keep in goal range with improving Scr trend -Monitor daily HL, CBC, and for s/sx of bleeding -Will follow up if plan for cath   Thanks for allowing pharmacy to be a part of this patient's care.  Doylene Canard, PharmD Clinical Pharmacist  Pager: [phone] Phone: [phone]

## 2018-07-30 NOTE — Care Management Note (Signed)
Case Management Note Marvetta Gibbons RN, BSN Transitions of Care Unit 4E- RN Case Manager [phone]  Patient Details  Name: [name] MRN: [id number] Date of Birth: September 21, 1930  Subjective/Objective:   Pt admitted with NSTEMI,  AKI                 Action/Plan: PTA pt lived at home, was independent with ADLs, CM to follow for transition of care needs   Expected Discharge Date:                  Expected Discharge Plan:     In-House Referral:     Discharge planning Services  CM Consult  Post Acute Care Choice:    Choice offered to:     DME Arranged:    DME Agency:     HH Arranged:    [place] Agency:     Status of Service:  In process, will continue to follow  If discussed at Long Length of Stay Meetings, dates discussed:    Discharge Disposition:   Additional Comments:  Dawayne Patricia, RN 07/30/2018, 2:16 PM

## 2018-07-30 NOTE — Progress Notes (Signed)
CRITICAL VALUE ALERT  Critical Value: Calcium 6.4   Date & Time Notied:  07/30/2018 0745  Provider Notified: Reino Bellis  Orders Received/Actions taken: None at this time

## 2018-07-31 ENCOUNTER — Encounter (HOSPITAL_COMMUNITY): Payer: Self-pay | Admitting: Interventional Cardiology

## 2018-07-31 DIAGNOSIS — N179 Acute kidney failure, unspecified: Secondary | ICD-10-CM

## 2018-07-31 DIAGNOSIS — N189 Chronic kidney disease, unspecified: Secondary | ICD-10-CM

## 2018-07-31 DIAGNOSIS — I428 Other cardiomyopathies: Secondary | ICD-10-CM

## 2018-07-31 LAB — CBC
HEMATOCRIT: 34.8 % — AB (ref 36.0–46.0)
Hemoglobin: 10.5 g/dL — ABNORMAL LOW (ref 12.0–15.0)
MCH: 27.1 pg (ref 26.0–34.0)
MCHC: 30.2 g/dL (ref 30.0–36.0)
MCV: 89.9 fL (ref 80.0–100.0)
Platelets: 188 10*3/uL (ref 150–400)
RBC: 3.87 MIL/uL (ref 3.87–5.11)
RDW: 15.2 % (ref 11.5–15.5)
WBC: 7.9 10*3/uL (ref 4.0–10.5)
nRBC: 0 % (ref 0.0–0.2)

## 2018-07-31 LAB — BASIC METABOLIC PANEL
Anion gap: 7 (ref 5–15)
BUN: 29 mg/dL — ABNORMAL HIGH (ref 8–23)
CHLORIDE: 107 mmol/L (ref 98–111)
CO2: 25 mmol/L (ref 22–32)
CREATININE: 1.2 mg/dL — AB (ref 0.44–1.00)
Calcium: 6.7 mg/dL — ABNORMAL LOW (ref 8.9–10.3)
GFR calc non Af Amer: 40 mL/min — ABNORMAL LOW (ref >60)
GFR, EST AFRICAN AMERICAN: 46 mL/min — AB (ref >60)
Glucose, Bld: 95 mg/dL (ref 70–99)
Potassium: 4.1 mmol/L (ref 3.5–5.1)
Sodium: 139 mmol/L (ref 135–145)

## 2018-07-31 MED ORDER — CLOPIDOGREL BISULFATE 75 MG PO TABS
75.0000 mg | ORAL_TABLET | Freq: Every day | ORAL | Status: DC
  Administered 2018-07-31 – 2018-08-01 (×2): 75 mg via ORAL
  Filled 2018-07-31 (×2): qty 1

## 2018-07-31 MED ORDER — LOSARTAN POTASSIUM 25 MG PO TABS
12.5000 mg | ORAL_TABLET | Freq: Every day | ORAL | Status: DC
  Administered 2018-07-31 – 2018-08-01 (×2): 12.5 mg via ORAL
  Filled 2018-07-31 (×2): qty 1

## 2018-07-31 NOTE — Progress Notes (Signed)
RN gave pt pulm neb. RT will continue to monitor pt.

## 2018-07-31 NOTE — Progress Notes (Addendum)
Progress Note  Patient Name: [name] Date of Encounter: 07/31/2018  Primary Cardiologist: Rozann Lesches, MD  Subjective   Denies chest pain or SOB. Cath site unremarkable   Inpatient Medications    Scheduled Meds: . aspirin  81 mg Oral Daily  . atorvastatin  80 mg Oral q1800  . budesonide  0.25 mg Nebulization BID  . calcium-vitamin D  1 tablet Oral TID  . carvedilol  3.125 mg Oral BID  . heparin  5,000 Units Subcutaneous Q8H  . levothyroxine  100 mcg Oral Q0600  . predniSONE  5 mg Oral Q breakfast  . sodium chloride flush  3 mL Intravenous Q12H  . sodium chloride flush  3 mL Intravenous Q12H  . temazepam  30 mg Oral QHS   Continuous Infusions: . sodium chloride    . sodium chloride     PRN Meds: sodium chloride, sodium chloride, acetaminophen, nitroGLYCERIN, ondansetron (ZOFRAN) IV, sodium chloride flush, sodium chloride flush   Vital Signs    Vitals:   07/30/18 2128 07/30/18 2320 07/31/18 0338 07/31/18 0820  BP:  (!) 97/51 133/72 116/69  Pulse:  61 70 61  Resp:  _0 Temp:  97.7 F (36.5 C) (!) 97.5 F (36.4 C) (!) 97.5 F (36.4 C)  TempSrc:  Oral Oral Oral  SpO2: 99% 96% 93% 100%  Weight:   86.1 kg   Height:        Intake/Output Summary (Last 24 hours) at 07/31/2018 1027 Last data filed at 07/31/2018 0400 Gross per 24 hour  Intake 1384.67 ml  Output -  Net 1384.67 ml   Filed Weights   07/29/18 0446 07/30/18 0142 07/31/18 0338  Weight: 85.5 kg 85.8 kg 86.1 kg    Physical Exam   General: Pleasant, well developed, well nourished, NAD Skin: Warm, dry, intact  Head: Normocephalic, atraumatic, sclera non-icteric, no xanthomas, clear, moist mucus membranes. Neck: Negative for carotid bruits. No JVD Lungs:Clear to ausculation bilaterally. No wheezes, rales, or rhonchi. Breathing is unlabored. Cardiovascular: RRR with S1 S2. No murmurs, rubs, gallops, or LV heave appreciated. Abdomen: Soft, non-tender, non-distended with  normoactive bowel sounds. No obvious abdominal masses. MSK: Strength and tone appear normal for age. 5/5 in all extremities Extremities: No edema. No clubbing or cyanosis. DP/PT pulses 2+ bilaterally Neuro: Alert and oriented. No focal deficits. No facial asymmetry. MAE spontaneously. Psych: Responds to questions appropriately with normal affect.    Labs    Chemistry Recent Labs  Lab 07/27/18 1904 07/29/18 0429 07/29/18 0722 07/30/18 0606 07/31/18 0326  NA 139 138  --  140 139  K 4.7 4.2  --  4.2 4.1  CL 104 108  --  108 107  CO2 23 20*  --  24 25  GLUCOSE 131* 82  --  94 95  BUN 52* 46*  --  37* 29*  CREATININE 2.16* 1.83*  --  1.62* 1.20*  CALCIUM 6.1* 5.8*  --  6.4* 6.7*  PROT 7.4  --   --   --   --   ALBUMIN 3.5  --  2.9*  --   --   AST 35  --   --   --   --   ALT 26  --   --   --   --   ALKPHOS 50  --   --   --   --   BILITOT 0.6  --   --   --   --  GFRNONAA 20* 24*  --  28* 40*  GFRAA 23* 28*  --  32* 46*  ANIONGAP 12 10  --  8 7     Hematology Recent Labs  Lab 07/28/18 0526 07/30/18 0309 07/31/18 0326  WBC 7.8 6.6 7.9  RBC 3.96 3.81* 3.87  HGB 11.1* 10.5* 10.5*  HCT 36.2 34.0* 34.8*  MCV 91.4 89.2 89.9  MCH 28.0 27.6 27.1  MCHC 30.7 30.9 30.2  RDW 15.4 15.1 15.2  PLT 150 175 188    Cardiac Enzymes Recent Labs  Lab 07/27/18 1904 07/27/18 2323 07/28/18 1350  TROPONINI 11.85* 11.13* 10.03*   No results for input(s): TROPIPOC in the last 168 hours.   BNP Recent Labs  Lab 07/26/18 1543 07/27/18 1904  BNP 1,074.0* 899.0*     DDimer No results for input(s): DDIMER in the last 168 hours.   Radiology    No results found.  Telemetry    07/31/18 NSR - Personally Reviewed  ECG    No new tracing as of 07/31/18 - Personally Reviewed  Cardiac Studies   Left heart catheterization: 07/30/18:   Left dominant coronary anatomy.  Normal left main, short.  Nonobstructive proximal LAD 30% and somewhat hazy mid to distal LAD relatively focal  50% stenosis.  Dominant circumflex with large widely patent obtuse marginals.  Circumflex PDA is relatively small and could be a site of SACD although no definite diagnostic features  LVEDP is normal.  Ventriculography was not performed.  Myocardial infarction with no obstructive coronary artery disease (MINOCA)  RECOMMENDATIONS:   Consider dual antiplatelet therapy (aspirin and Plavix)  Beta-blocker therapy if appropriate.  IV heparin has been discontinued and subcutaneous heparin DVT prophylaxis started.  Recommend uninterrupted dual antiplatelet therapy with Aspirin 52m daily and Clopidogrel 792mdaily for a minimum of 6 months (stable ischemic heart disease - Class I recommendation).  Echocardiogram 07/29/18: Study Conclusions  - Left ventricle: The cavity size was normal. Wall thickness was   increased in a pattern of mild LVH. Systolic function was   moderately reduced. The estimated ejection fraction was in the   range of 35% to 40%. Basal inferior and lateral severe   hypokinesis and pseudo-dyskinesis (enhanced diaphragmatic motion)   of the posterior wall. Doppler parameters are consistent with   abnormal left ventricular relaxation (grade 1 diastolic   dysfunction). The E/e&' ratio is >15, suggesting elevated LV   filling pressure. Ejection fraction (MOD, 2-plane): 37%. - Mitral valve: Moderate MAC with thickened leaflets. There was   mild to moderate regurgitation. - Left atrium: The atrium was normal in size. - Right atrium: The atrium was normal in size. - Tricuspid valve: There was mild regurgitation. - Pulmonary arteries: PA peak pressure: 35 mm Hg (S). - Inferior vena cava: The vessel was normal in size. The   respirophasic diameter changes were in the normal range (>= 50%),   consistent with normal central venous pressure.  Impressions:  - Compared to a prior study in 03/2018, the LVEF is lower at 35-40%   with inferolateral akinesis and  pseudo-dyskinesis of the   posterior wall.  Patient Profile     [age].o. female with a hx of nonobstructive CAD by cath 2004 (60% LAD), presumed NICM EF 40%, asthma, breast CA (mastectomy/Adriamycin), Hodgkin's disease, obesity, PAD s/p stent to LCIA 2006, thyroid carcinoma, CKD stage III by labs(unknown to patient)who was seen for the evaluation ofNSTEMI.   Assessment & Plan    1.NSTEMI with prior nonobstructive CAD (suspect  now obstructive): -Cath performed 07/30/18 which revealed nonobstructive proximal LAD 30%, mid to distal LAD relatively focal 50% stenosis, circumflex PDA is relatively small and could be a site of SACD although no definite diagnostic features -Recommendations for uninterrupted dual antiplatelet therapy with Aspirin 40m daily and Clopidogrel 737mdaily for a minimum of 6 months per cath report -Peak troponin, 11.85>>11.13>>10.03 -Cath site unremarkable, right groin -Continue ASA, statin, carvedilol -Will add Plavix given the recommendations per cath note  -Hold off on adding home dose SpArlyce Harmanor now until after BP stabilization  -Will add losartan in anticipation of starting Entresto in the OP setting   2. Prior h/oNICM with elevated BNP/chronic combined CHF: -Elevated BNP on admission without significant volume overload on exam -Weight, 189lb today>>192lb on admission  -I&O, net positive 2.2L since admission  -Will change home lisinopril to losartan with plans for possible Entresto initiation  -Hold diuretic in the setting of renal dysfunction with the addition of losartan   3. Acute kidney injury superimposed on CKD stage III: -Creatinine, 1.20 today>>1.62 yesterday  -Renal [place] right inferior pelvic calcification/stone versus peripherally calcified central renal mass. Recommend further evaluation with CT scan of the abdomen and pelvis.   -Prior to admission she was on a low dose of Lasix 20 mg a day, lisinopril 5 mg a day and Aldactone 12.5 mg a  day  4. Abnormalities noted on CT scan with ground glass nodules and thickening of gastric wall plus pelvic mass: -This will need follow-up and outpatient work-up, need to address cardiac issues first -Right inferior pelvic calcification/stone versus peripherally calcified central renal mass>>>recommend further evaluation with CT scan of the abdomen and pelvis. -Ground-glass nodules in the left upper lobe, the larger measuring 23m22mith non-contrast chest CT recommended at 3-6 months  -Will discuss recommendations with patient, pt made aware   5. Asthma: -Stable on Pulmicort nebs twice daily -Prednisone 10 mg initially>>5 mg daily for 3 days  6. Hyperlipidemia: -Continue Lipitor  -Recheck lipids and liver functions in 8 weeks  7. Hypocalcemia:  -Calcium was 3.1 on admission with no prior values in the system. -PTH borderline low at 17 -Received 1 amp of calcium gluconate with improvement  -Magnesium was 1.9, phosphorus was elevated at 7.5 and albumin was low at 2.9. -Follow-up with PCP after discharge, may need further treatment   Signed, JilKathyrn Drown-C HeaNew Britainger: [id number]/02/2018, 10:27 AM      Patient seen and examined. Agree with assessment and plan. I reviewed cath images with Dr. SmiTamala Juliand  cath data reviewed with patient. No obvious culprit to explain troponin. ? Pruning of small distal LCX without definitive SCAD.  Medical therapy with DAPT.  Anticipate DC tomorrow.   ThoTroy SineD, FACDigestive Health Center Of Indiana Pc/02/2018 2:54 PM  For questions or updates, please contact   Please consult [email or web address] for contact info under Cardiology/STEMI.

## 2018-08-01 ENCOUNTER — Inpatient Hospital Stay (HOSPITAL_COMMUNITY): Payer: Medicare HMO

## 2018-08-01 LAB — BASIC METABOLIC PANEL
Anion gap: 8 (ref 5–15)
BUN: 25 mg/dL — AB (ref 8–23)
CO2: 26 mmol/L (ref 22–32)
CREATININE: 1.52 mg/dL — AB (ref 0.44–1.00)
Calcium: 7.3 mg/dL — ABNORMAL LOW (ref 8.9–10.3)
Chloride: 105 mmol/L (ref 98–111)
GFR calc Af Amer: 35 mL/min — ABNORMAL LOW (ref >60)
GFR calc non Af Amer: 30 mL/min — ABNORMAL LOW (ref >60)
GLUCOSE: 94 mg/dL (ref 70–99)
Potassium: 4.7 mmol/L (ref 3.5–5.1)
Sodium: 139 mmol/L (ref 135–145)

## 2018-08-01 LAB — CBC
HCT: 33.9 % — ABNORMAL LOW (ref 36.0–46.0)
Hemoglobin: 10.4 g/dL — ABNORMAL LOW (ref 12.0–15.0)
MCH: 27.9 pg (ref 26.0–34.0)
MCHC: 30.7 g/dL (ref 30.0–36.0)
MCV: 90.9 fL (ref 80.0–100.0)
NRBC: 0.3 % — AB (ref 0.0–0.2)
PLATELETS: 172 10*3/uL (ref 150–400)
RBC: 3.73 MIL/uL — AB (ref 3.87–5.11)
RDW: 15.4 % (ref 11.5–15.5)
WBC: 7.5 10*3/uL (ref 4.0–10.5)

## 2018-08-01 LAB — PHOSPHORUS: PHOSPHORUS: 5.1 mg/dL — AB (ref 2.5–4.6)

## 2018-08-01 MED ORDER — ATORVASTATIN CALCIUM 80 MG PO TABS: 80.0000 mg | ORAL_TABLET | Freq: Every day | ORAL | 1 refills | Status: DC

## 2018-08-01 MED ORDER — CALCIUM CARBONATE-VITAMIN D 500-200 MG-UNIT PO TABS: 1.0000 | ORAL_TABLET | Freq: Three times a day (TID) | ORAL | 1 refills | Status: AC

## 2018-08-01 MED ORDER — LOSARTAN POTASSIUM 25 MG PO TABS: 12.5000 mg | ORAL_TABLET | Freq: Every day | ORAL | 1 refills | Status: DC

## 2018-08-01 MED ORDER — SPIRONOLACTONE 12.5 MG HALF TABLET
12.5000 mg | ORAL_TABLET | Freq: Every day | ORAL | Status: DC
  Administered 2018-08-01: 12.5 mg via ORAL
  Filled 2018-08-01: qty 1

## 2018-08-01 MED ORDER — IOHEXOL 300 MG/ML  SOLN
100.0000 mL | Freq: Once | INTRAMUSCULAR | Status: AC | PRN
  Administered 2018-08-01: 100 mL via INTRAVENOUS

## 2018-08-01 MED ORDER — NITROGLYCERIN 0.4 MG SL SUBL: 0.4000 mg | SUBLINGUAL_TABLET | SUBLINGUAL | 1 refills | Status: AC | PRN

## 2018-08-01 MED ORDER — CLOPIDOGREL BISULFATE 75 MG PO TABS: 75.0000 mg | ORAL_TABLET | Freq: Every day | ORAL | 2 refills | Status: DC

## 2018-08-01 MED FILL — NITROGLYCERIN 0.4 MG TAB SL: 0.4 | 25 days supply | Qty: 25 | Fill #0 | Status: TO

## 2018-08-01 MED FILL — CLOPIDOGREL 75 MG TABLET: 75 | 30 days supply | Qty: 30 | Fill #0 | Status: TO

## 2018-08-01 MED FILL — ATORVASTATIN CALCIUM 80 MG: 80 | 30 days supply | Qty: 30 | Fill #0 | Status: TO

## 2018-08-01 MED FILL — LOSARTAN POTASSIUM 25 MG TA: 25 | 30 days supply | Qty: 15 | Fill #0 | Status: TO

## 2018-08-01 NOTE — Progress Notes (Signed)
Patient given contrast media from CT scan and instructed to drink per CT instructions. Patient verbalized understanding.   1430: Patient drank first bottle 1530: Patient drank second bottle

## 2018-08-01 NOTE — Discharge Summary (Signed)
Discharge Summary    Patient ID: [name],  MRN: [id number], DOB/AGE: [birth year] 84 y.o.  Admit date: 07/27/2018 Discharge date: 08/01/2018  Primary Care Provider: Lemmie Evens Primary Cardiologist: Dr. Domenic Polite  Discharge Diagnoses    Principal Problem:   NSTEMI (non-ST elevated myocardial infarction) [facility]) Active Problems:   CAD (coronary artery disease), native coronary artery   Nonischemic cardiomyopathy ([place])   Peripheral arterial disease ([place])   Acute kidney injury superimposed on chronic kidney disease ([place])  Allergies No Known Allergies  Diagnostic Studies/Procedures    Cath: 07/30/18   Left dominant coronary anatomy.  Normal left main, short.  Nonobstructive proximal LAD 30% and somewhat hazy mid to distal LAD relatively focal 50% stenosis.  Dominant circumflex with large widely patent obtuse marginals.  Circumflex PDA is relatively small and could be a site of SACD although no definite diagnostic features  LVEDP is normal.  Ventriculography was not performed.  Myocardial infarction with no obstructive coronary artery disease ([facility])  RECOMMENDATIONS:   Consider dual antiplatelet therapy (aspirin and Plavix)  Beta-blocker therapy if appropriate.  IV heparin has been discontinued and subcutaneous heparin DVT prophylaxis started.  Recommend uninterrupted dual antiplatelet therapy with Aspirin 58m daily and Clopidogrel 767mdaily for a minimum of 6 months (stable ischemic heart disease - Class I recommendation).  TTE: 07/29/18  Study Conclusions  - Left ventricle: The cavity size was normal. Wall thickness was   increased in a pattern of mild LVH. Systolic function was   moderately reduced. The estimated ejection fraction was in the   range of 35% to 40%. Basal inferior and lateral severe   hypokinesis and pseudo-dyskinesis (enhanced diaphragmatic motion)   of the posterior wall. Doppler parameters are consistent with   abnormal  left ventricular relaxation (grade 1 diastolic   dysfunction). The E/e&' ratio is >15, suggesting elevated LV   filling pressure. Ejection fraction (MOD, 2-plane): 37%. - Mitral valve: Moderate MAC with thickened leaflets. There was   mild to moderate regurgitation. - Left atrium: The atrium was normal in size. - Right atrium: The atrium was normal in size. - Tricuspid valve: There was mild regurgitation. - Pulmonary arteries: PA peak pressure: 35 mm Hg (S). - Inferior vena cava: The vessel was normal in size. The   respirophasic diameter changes were in the normal range (>= 50%),   consistent with normal central venous pressure.  Impressions:  - Compared to a prior study in 03/2018, the LVEF is lower at 35-40%   with inferolateral akinesis and pseudo-dyskinesis of the   posterior wall. _____________   History of Present Illness     84 y.o. female with a hx of nonobstructive CAD by cath 2004 (60% LAD), presumed NICM EF 40%, asthma, breast CA (mastectomy/Adriamycin), Hodgkin's disease, obesity, PAD s/p stent to LCIA 2006, thyroid carcinoma, CKD stage III by labs (unknown to patient) who presented to the ED on 07/28/18 for shortness of breath.   Records were somewhat challenging to follow in the chart as there were numerous studies all scanned under the date 05/2015 but of varying timepoints out of order. Remote cath done 07/2003 showed 60% mLAD with negative IVUS, original report scanned under CARDIAC CATHETERIZATION in CV Proc 2016 with negative disease elsewhere, EF 50-55%. Last ischemic eval was by nuc 2011 showing attenuation artifact inferiorly without significant ischemia demonstrated, EF 33%, reported to be low risk scan. EF has varied over the years - echo in 2010 showed EF 50-55%, 01/2011 showed EF 35%,  2013 showed 40-45%, 2018 30-35%, 03/2018 EF 40% with diffuse HK, grade 1 DD, mild-mod LVH, PASP 87mHg mildly increased.  She is very active and walked 2 miles on Oct 26th as usual.  She exercised on Monday Oct 28th as normal and walked the dog and felt fine. She also worked out in her garden. On Tues Oct 29th she was with a friend who noted she appeared SOB so took her to primary care office. Based on how she looked it was advised she go to hospital by EMS but rescue squad indicated there would be a delay so she said Dr. KKarie Kirksput her in his car and drove her there himself. CXR nonacute, troponin 0.07, BNP elevated. She was felt to have asthma exacerbation and given prednisone as well as dose of Lasix for elevated BNP and DC home with close OP f/u. She felt fine the following 2 days but when she went back to primary care office she was noted to still be SOB with walking and was advised to return to the ER. Labs showed a BNP [id number] troponin 11.85-11.13, BUN 52/Cr 2.16 (prior baseline unclear, 1.7-1.9 recently - previous values 1.4-1.6 in 2016), LDL 109, UA with large Hgb and trace leuks. CT chest (noncontrast) showed multiple abnormalities with ground glass nodules, 5 mm left mainstem bronchus endobronchial soft tissue nodule (this suggested benign), small pericardial effusion, enlarged heart, coronary atherosclerosis, diminutive caliber and coarse dystrophic calcifications of the left brachiocephalic vein, likely due to chronic obstruction, enlargement of main pulm trunk usually a/w pulm HTN, irregular contour of L kidney, thickening of gastric wall along the greater curvature of the stomach which could represent artifact from stomach distention/undigested food matter but could not exclude gastric mass suggest UGI endoscopy when stable. She denied any orthopnea, LEE, PND, weight gain (instead reporting 6lb weight loss which was a surprise to her). She had not had any CP at all. Tele showed NSR with occ PVCs/couplets. BP is intermittently soft 84/54. [facility] heparin, Duoneb. She did not receive any diuretics but stated she felt significantly better - but stated she  had felt improved lately while lying down rather than sitting up or moving. [facility]   [facility] Course    1.NSTEMI with prior nonobstructive CAD (suspect now obstructive): -Cath performed 07/30/18 which revealed nonobstructive proximal LAD 30%,mid to distal LAD relatively focal 50% stenosis, circumflex PDA is relatively small and could be a site of SACD although no definite diagnostic features -Recommendations foruninterrupted DAPT with Aspirin 870mdaily and Clopidogrel 7552mailyfor a minimum of 6 monthsper cath report -Peak troponin,11.85>>11.13>>10.03>>denies chest pain  -Cath site unremarkable, right groin -Continue ASA, statin, carvedilol -Plavix added 07/31/18 given the recommendations per cath note  -Add Spiro 12.5 today given stable BP  -Losartan added 07/31/18 in anticipation of starting Entresto in the OP setting  2. Prior h/oNICM with elevated BNP/chronic combined CHF: -Elevated BNPon admissionwithout significant volume overload on exam -Weight, 189lb today>>192lb on admission  -I&O, net positive 3.2 since admission  -Will change home lisinopril to losartan with plans for possible Entresto initiation  -Will add home dose lasix at d/c   3. Acute kidney injury superimposed on CKD stage III: -Creatinine, 1.20 07/31/18>>1.62 07/30/18>>will obtain BMET prior to discharge  -Renal US Koreampleted withright inferior pelvic calcification/stone versus peripherally calcified central renal mass. Recommend further evaluation with CT scan of the abdomen and pelvis. -Prior to admission she was  on a low dose of Lasix 20 mg a day, lisinopril 5 mg a day and Aldactone 12.5 mg a day  4. Abnormalities noted on CT scan with ground glass nodules and thickening of gastric wallplus pelvic mass with hx of prior CA: -This will need follow-upandoutpatient work-up -Right inferior pelvic calcification/stone versus peripherally calcified  central renal mass>>>recommend further evaluation with follow up  CT scan of the abdomen and pelvis. Will order abdominal CT with contrast prior to discharge.  -Ground-glass nodules in the left upper lobe, the larger measuring 67mwith non-contrast chest CTrecommendedat 3-6 months -Discussed findings and recommendations  with patient 07/31/18 -Pt has followed with Dr. KDelton Coombesfor oncology issues in the past. Has an appointment on 04/10/2019 at 1015am. This will be arranged for sooner in regards to follow up.  5. Asthma: -Stable onPulmicort nebs twice daily -Prednisone 10 mginitially>>5 mg daily for 3 days  6. Hyperlipidemia: -Continue Lipitor  -Recheck lipids and liver functions in 8 weeks  7. Hypocalcemia: -Calcium was 3.1 on admission withno priorvalues in the system. -PTHborderline low at 17 -Received1 amp of calcium gluconatewith improvement -Magnesium was 1.9, phosphorus was elevated at 7.5 and albumin was low at 2.9. -Follow-up with PCP after discharge, may need further treatment -Will d/c on [place] with D 500-2043m Darriel M Espaillat was seen by Dr. KeClaiborne Billingsnd determined stable for discharge home. Follow up in the office has been arranged. Medications are listed below.   _____________  Discharge Vitals Blood pressure 112/64, pulse 77, temperature (!) 97.5 F (36.4 C), temperature source Oral, resp. rate 18, height _0  (1.753 m), weight 87 kg, SpO2 99 %.  Filed Weights   07/30/18 0142 07/31/18 0338 08/01/18 0322  Weight: 85.8 kg 86.1 kg 87 kg    Labs & Radiologic Studies    CBC Recent Labs    07/31/18 0326 08/01/18 0303  WBC 7.9 7.5  HGB 10.5* 10.4*  HCT 34.8* 33.9*  MCV 89.9 90.9  PLT 188 17607 Basic Metabolic Panel Recent Labs    07/30/18 0606 07/31/18 0326 08/01/18 0303  NA 140 139  --   K 4.2 4.1  --   CL 108 107  --   CO2 24 25  --   GLUCOSE 94 95  --   BUN 37* 29*  --   CREATININE 1.62* 1.20*  --   CALCIUM 6.4* 6.7*  --     PHOS  --   --  5.1*   Liver Function Tests No results for input(s): AST, ALT, ALKPHOS, BILITOT, PROT, ALBUMIN in the last 72 hours. No results for input(s): LIPASE, AMYLASE in the last 72 hours. Cardiac Enzymes No results for input(s): CKTOTAL, CKMB, CKMBINDEX, TROPONINI in the last 72 hours. BNP Invalid input(s): POCBNP D-Dimer No results for input(s): DDIMER in the last 72 hours. Hemoglobin A1C No results for input(s): HGBA1C in the last 72 hours. Fasting Lipid Panel No results for input(s): CHOL, HDL, LDLCALC, TRIG, CHOLHDL, LDLDIRECT in the last 72 hours. Thyroid Function Tests No results for input(s): TSH, T4TOTAL, T3FREE, THYROIDAB in the last 72 hours.  Invalid input(s): FREET3 _____________  Dg Chest 2 View  Result Date: 07/27/2018 CLINICAL DATA:  Weakness and shortness of breath. EXAM: CHEST - 2 VIEW COMPARISON:  CT chest from yesterday. FINDINGS: Stable cardiomegaly. Normal pulmonary vascularity. Atherosclerotic calcification of the aortic arch. No focal consolidation, pleural effusion, or pneumothorax. No acute osseous abnormality. Unchanged surgical clips in the right axilla. IMPRESSION: No active cardiopulmonary disease. Electronically Signed  By: Titus Dubin M.D.   On: 07/27/2018 19:24   Ct Chest Wo Contrast  Result Date: 07/26/2018 CLINICAL DATA:  Shortness of breath labored breathing. EXAM: CT CHEST WITHOUT CONTRAST TECHNIQUE: Multidetector CT imaging of the chest was performed following the standard protocol without IV contrast. COMPARISON:  Chest radiograph 07/23/2018, chest CT 05/13/2002 FINDINGS: Cardiovascular: Enlarged heart. Small pericardial effusion measuring 5 mm in greatest thickness. Calcific atherosclerotic disease of the coronary arteries and aorta. Tortuosity of the aorta without aneurysmal dilation. Enlargement of the main pulmonary artery, usually associated with pulmonary arterial hypertension. Diminutive caliber and coarse dystrophic calcifications  along the left brachiocephalic vein, likely due to chronic occlusion. Mediastinum/Nodes: No enlarged mediastinal or axillary lymph nodes. The trachea, and esophagus demonstrate no significant findings. Probable prior left thyroidectomy. Lungs/Pleura: Mild hyperinflation of the lungs. Left upper lobe ground-glass nodule measures 8 mm, image 23/160, sequence 4 and image 100/151, sequence 6. Additional smaller ground-glass nodule in the right upper lobe medially, image 33/160 and 68/151. Mild nodular thickening of the pleura in bilateral lung apices. 5 mm posterior left mainstem bronchus soft tissue nodule, stable from the prior chest CT dated 05/13/2002. Upper Abdomen: Irregular contour of the left kidney, incompletely visualized due to lack of IV contrast. Mild thickening of the left adrenal gland, stable. Apparent thickening of the gastric wall along the greater curvature, incompletely visualized. Musculoskeletal: No chest wall mass or suspicious bone lesions identified. Post right breast lumpectomy and axillary lymph node dissection. IMPRESSION: Ground-glass nodules in the left upper lobe, the larger measuring 8 mm. Non-contrast chest CT at 3-6 months is recommended. If the nodules are stable at time of repeat CT, then future CT at 18-24 months (from today's scan) is considered optional for low-risk patients, but is recommended for high-risk patients. This recommendation follows the consensus statement: Guidelines for Management of Incidental Pulmonary Nodules Detected on CT Images: From the Fleischner Society 2017; Radiology 2017; 284:228-243. 5 mm left mainstem bronchus endobronchial soft tissue nodule, which is stable to the prior CT dated 05/13/2002. The long-term stability suggests benign or indolent etiology. Enlarged heart with small pericardial effusion. Calcific atherosclerotic disease of the coronary arteries and aorta. Diminutive caliber and coarse dystrophic calcifications of the left brachiocephalic  vein, likely due to chronic obstruction. Enlargement of the main pulmonary trunk, usually associated with pulmonary arterial hypertension. Irregular contour of the left kidney, incompletely visualized due to lack of IV contrast. Apparent thickening of the gastric wall along the greater curvature of the stomach. This may represent artifact from incompletely distended stomach/undigested food matter. Gastric wall mass cannot be entirely excluded however. Correlation to upper GI endoscopy may be considered when clinically feasible. Aortic Atherosclerosis (ICD10-I70.0). Electronically Signed   By: Fidela Salisbury M.D.   On: 07/26/2018 18:17   US Renal  Result Date: 07/29/2018 CLINICAL DATA:  84 year old female with acute kidney injury superimposed on chronic kidney disease EXAM: RENAL / URINARY TRACT ULTRASOUND COMPLETE COMPARISON:  None. FINDINGS: Right Kidney: Renal measurements: 8.9 x 3.9 x 4.7 cm = volume: 86 mL. Increased parenchymal echogenicity. There is an echogenic focus in the lower pole with posterior acoustic shadowing which measures approximately 1.6 cm. The architecture of this abnormality is not well evaluated secondary to the shadowing artifact, however it appears somewhat rounded and central within the kidney in the region of the collecting system. No evidence of hydronephrosis. Left Kidney: Renal measurements: 8.7 x 4.6 x 4.0 cm = volume: 84 mL. Increased parenchymal echogenicity. No mass or hydronephrosis visualized. Bladder:  Appears normal for degree of bladder distention. IMPRESSION: 1. Large (1.6 cm) right inferior pelvic calcification/stone versus peripherally calcified central renal mass. Recommend further evaluation with CT scan of the abdomen and pelvis. 2. Echogenic kidneys bilaterally consistent with underlying medical renal disease. Electronically Signed   By: Jacqulynn Cadet M.D.   On: 07/29/2018 08:22   Dg Chest Portable 1 View  Result Date: 07/23/2018 CLINICAL DATA:   Respiratory distress EXAM: PORTABLE CHEST 1 VIEW COMPARISON:  11/11/2012 FINDINGS: Cardiac shadow is enlarged. Aortic calcifications are again noted. Postsurgical changes in the left axilla seen. No focal infiltrate or sizable effusion is noted. No bony abnormality is noted. IMPRESSION: No acute abnormality seen. Electronically Signed   By: Inez Catalina M.D.   On: 07/23/2018 14:15   Disposition   Pt is being discharged home today in good condition.  Follow-up Plans & Appointments    Follow-up Information    Imogene Burn, PA-C Follow up on 09/09/2018.   Specialty:  Cardiology Why:  at 12:30pm for your follow up appt.  Contact information: [place] [address] [phone]          Discharge Instructions    (HEART FAILURE PATIENTS) Call MD:  Anytime you have any of the following symptoms: 1) 3 pound weight gain in 24 hours or 5 pounds in 1 week 2) shortness of breath, with or without a dry hacking cough 3) swelling in the hands, feet or stomach 4) if you have to sleep on extra pillows at night in order to breathe.   Complete by:  As directed    Call MD for:  redness, tenderness, or signs of infection (pain, swelling, redness, odor or green/yellow discharge around incision site)   Complete by:  As directed    Diet - low sodium heart healthy   Complete by:  As directed    Discharge instructions   Complete by:  As directed    Radial Site Care Refer to this sheet in the next few weeks. These instructions provide you with information on caring for yourself after your procedure. Your caregiver may also give you more specific instructions. Your treatment has been planned according to current medical practices, but problems sometimes occur. Call your caregiver if you have any problems or questions after your procedure. HOME CARE INSTRUCTIONS You may shower the day after the procedure.Remove the bandage (dressing) and gently wash the site with plain soap and water.Gently  pat the site dry.  Do not apply powder or lotion to the site.  Do not submerge the affected site in water for 3 to 5 days.  Inspect the site at least twice daily.  Do not flex or bend the affected arm for 24 hours.  No lifting over 5 pounds (2.3 kg) for 5 days after your procedure.  Do not drive home if you are discharged the same day of the procedure. Have someone else drive you.  You may drive 24 hours after the procedure unless otherwise instructed by your caregiver.  What to expect: Any bruising will usually fade within 1 to 2 weeks.  Blood that collects in the tissue (hematoma) may be painful to the touch. It should usually decrease in size and tenderness within 1 to 2 weeks.  SEEK IMMEDIATE MEDICAL CARE IF: You have unusual pain at the radial site.  You have redness, warmth, swelling, or pain at the radial site.  You have drainage (other than a small amount of blood on the dressing).  You have chills.  You have a fever or persistent symptoms for more than 72 hours.  You have a fever and your symptoms suddenly get worse.  Your arm becomes pale, cool, tingly, or numb.  You have heavy bleeding from the site. Hold pressure on the site.   Please go to have your labs draw within a week and faxed to the office.   Increase activity slowly   Complete by:  As directed       Discharge Medications     Medication List    STOP taking these medications   CALCIUM 600 + D PO Replaced by:  calcium-vitamin D 500-200 MG-UNIT tablet   lisinopril 5 MG tablet Commonly known as:  PRINIVIL,ZESTRIL   simvastatin 40 MG tablet Commonly known as:  ZOCOR     TAKE these medications   aspirin 81 MG tablet Take 81 mg by mouth daily.   atorvastatin 80 MG tablet Commonly known as:  LIPITOR Take 1 tablet (80 mg total) by mouth daily at 6 PM.   calcium-vitamin D 500-200 MG-UNIT tablet Commonly known as:  OSCAL WITH D Take 1 tablet by mouth 3 (three) times daily. Replaces:  CALCIUM 600 + D PO    carvedilol 3.125 MG tablet Commonly known as:  COREG Take 1 tablet (3.125 mg total) by mouth 2 (two) times daily.   clopidogrel 75 MG tablet Commonly known as:  PLAVIX Take 1 tablet (75 mg total) by mouth daily. Start taking on:  08/02/2018   diphenhydramine-acetaminophen 25-500 MG Tabs tablet Commonly known as:  TYLENOL PM Take 1 tablet by mouth at bedtime as needed (sleep).   FLOVENT HFA 44 MCG/ACT inhaler Generic drug:  fluticasone Inhale 2 puffs into the lungs 2 (two) times daily.   furosemide 20 MG tablet Commonly known as:  LASIX Take 1 tablet (20 mg total) by mouth daily.   levothyroxine 100 MCG tablet Commonly known as:  SYNTHROID, LEVOTHROID Take 100 mcg by mouth daily.   losartan 25 MG tablet Commonly known as:  COZAAR Take 0.5 tablets (12.5 mg total) by mouth daily. Start taking on:  08/02/2018   nitroGLYCERIN 0.4 MG SL tablet Commonly known as:  NITROSTAT Place 1 tablet (0.4 mg total) under the tongue every 5 (five) minutes x 3 doses as needed for chest pain.   ONE-A-DAY WOMENS PO Take 1 tablet by mouth daily.   predniSONE 10 MG tablet Commonly known as:  DELTASONE Take 2 tablets (20 mg total) by mouth daily.   spironolactone 25 MG tablet Commonly known as:  ALDACTONE Take 0.5 tablets (12.5 mg total) by mouth daily.   temazepam 30 MG capsule Commonly known as:  RESTORIL Take 30 mg by mouth at bedtime.   vitamin C 500 MG tablet Commonly known as:  ASCORBIC ACID Take 500 mg by mouth daily.       Acute coronary syndrome (MI, NSTEMI, STEMI, etc) this admission?:  No.  The elevated Troponin was due to the acute medical illness or demand ischemia.    Outstanding Labs/Studies   BMET to be obtained in [place] within a week.   Duration of Discharge Encounter   Greater than 30 minutes including physician time.  Signed, Reino Bellis NP-C 08/01/2018, 11:52 AM

## 2018-08-01 NOTE — Care Management Important Message (Signed)
Important Message  Patient Details  Name: [name] MRN: [id number] Date of Birth: 11/24/1929   Medicare Important Message Given:  Yes    Terrell Shimko P Melik Blancett 08/01/2018, 4:01 PM

## 2018-08-01 NOTE — Progress Notes (Addendum)
Progress Note  Patient Name: [name] Date of Encounter: 08/01/2018  Primary Cardiologist: Rozann Lesches, MD  Subjective   Pt doing well this AM. Son in the room and will be taking pt to his home in [place] for several weeks after discharge. Long discussion with son, will plan to arrange follow up with Dr. Domenic Polite office in the beginning of December. I am concerned that she will be lost in follow up.   Inpatient Medications    Scheduled Meds: . aspirin  81 mg Oral Daily  . atorvastatin  80 mg Oral q1800  . budesonide  0.25 mg Nebulization BID  . calcium-vitamin D  1 tablet Oral TID  . carvedilol  3.125 mg Oral BID  . clopidogrel  75 mg Oral Daily  . heparin  5,000 Units Subcutaneous Q8H  . levothyroxine  100 mcg Oral Q0600  . losartan  12.5 mg Oral Daily  . sodium chloride flush  3 mL Intravenous Q12H  . sodium chloride flush  3 mL Intravenous Q12H  . temazepam  30 mg Oral QHS   Continuous Infusions: . sodium chloride    . sodium chloride     PRN Meds: sodium chloride, sodium chloride, acetaminophen, nitroGLYCERIN, ondansetron (ZOFRAN) IV, sodium chloride flush, sodium chloride flush   Vital Signs    Vitals:   08/01/18 0500 08/01/18 0713 08/01/18 0729 08/01/18 0852  BP: 116/61  121/64 112/64  Pulse: 86  66 77  Resp:   18   Temp: 98.8 F (37.1 C)  (!) 97.5 F (36.4 C)   TempSrc: Oral  Oral   SpO2: 99% 99% 99%   Weight:      Height:        Intake/Output Summary (Last 24 hours) at 08/01/2018 0915 Last data filed at 08/01/2018 0501 Gross per 24 hour  Intake 840 ml  Output 2 ml  Net 838 ml   Filed Weights   07/30/18 0142 07/31/18 0338 08/01/18 0322  Weight: 85.8 kg 86.1 kg 87 kg    Physical Exam   General: Elderly, NAD Skin: Warm, dry, intact  Head: Normocephalic, atraumatic, clear, moist mucus membranes. Neck: Negative for carotid bruits. No JVD Lungs:Clear to ausculation bilaterally. No wheezes, rales, or rhonchi. Breathing is  unlabored. Cardiovascular: RRR with S1 S2. No murmurs, rubs, gallops, or LV heave appreciated. Abdomen: Soft, non-tender, non-distended with normoactive bowel sounds. No obvious abdominal masses. MSK: Strength and tone appear normal for age. 5/5 in all extremities Extremities: No edema. No clubbing or cyanosis. DP/PT pulses 2+ bilaterally Neuro: Alert and oriented. No focal deficits. No facial asymmetry. MAE spontaneously. Psych: Responds to questions appropriately with normal affect.    Labs    Chemistry Recent Labs  Lab 07/27/18 1904 07/29/18 0429 07/29/18 0722 07/30/18 0606 07/31/18 0326  NA 139 138  --  140 139  K 4.7 4.2  --  4.2 4.1  CL 104 108  --  108 107  CO2 23 20*  --  24 25  GLUCOSE 131* 82  --  94 95  BUN 52* 46*  --  37* 29*  CREATININE 2.16* 1.83*  --  1.62* 1.20*  CALCIUM 6.1* 5.8*  --  6.4* 6.7*  PROT 7.4  --   --   --   --   ALBUMIN 3.5  --  2.9*  --   --   AST 35  --   --   --   --   ALT 26  --   --   --   --  ALKPHOS 50  --   --   --   --   BILITOT 0.6  --   --   --   --   GFRNONAA 20* 24*  --  28* 40*  GFRAA 23* 28*  --  32* 46*  ANIONGAP 12 10  --  8 7     Hematology Recent Labs  Lab 07/30/18 0309 07/31/18 0326 08/01/18 0303  WBC 6.6 7.9 7.5  RBC 3.81* 3.87 3.73*  HGB 10.5* 10.5* 10.4*  HCT 34.0* 34.8* 33.9*  MCV 89.2 89.9 90.9  MCH 27.6 27.1 27.9  MCHC 30.9 30.2 30.7  RDW 15.1 15.2 15.4  PLT 175 188 172   Cardiac Enzymes Recent Labs  Lab 07/27/18 1904 07/27/18 2323 07/28/18 1350  TROPONINI 11.85* 11.13* 10.03*   No results for input(s): TROPIPOC in the last 168 hours.   BNP Recent Labs  Lab 07/26/18 1543 07/27/18 1904  BNP 1,074.0* 899.0*    DDimer No results for input(s): DDIMER in the last 168 hours.   Radiology    No results found.  Telemetry    08/01/18 NSR - Personally Reviewed  ECG    No new tracings as of 08/01/18- Personally Reviewed  Cardiac Studies   Left heart catheterization: 07/30/18:   Left  dominant coronary anatomy.  Normal left main, short.  Nonobstructive proximal LAD 30% and somewhat hazy mid to distal LAD relatively focal 50% stenosis.  Dominant circumflex with large widely patent obtuse marginals. Circumflex PDA is relatively small and could be a site of SACD although no definite diagnostic features  LVEDP is normal. Ventriculography was not performed.  Myocardial infarction with no obstructive coronary artery disease ([facility]A)  RECOMMENDATIONS:   Consider dual antiplatelet therapy (aspirin and Plavix)  Beta-blocker therapy if appropriate.  IV heparin has been discontinued and subcutaneous heparin DVT prophylaxis started.  Recommend uninterrupted dual antiplatelet therapy with Aspirin 46m daily and Clopidogrel 763mdailyfor a minimum of 6 months (stable ischemic heart disease - Class I recommendation).  Echocardiogram 07/29/18: Study Conclusions  - Left ventricle: The cavity size was normal. Wall thickness was increased in a pattern of mild LVH. Systolic function was moderately reduced. The estimated ejection fraction was in the range of 35% to 40%. Basal inferior and lateral severe hypokinesis and pseudo-dyskinesis (enhanced diaphragmatic motion) of the posterior wall. Doppler parameters are consistent with abnormal left ventricular relaxation (grade 1 diastolic dysfunction). The E/e&' ratio is >15, suggesting elevated LV filling pressure. Ejection fraction (MOD, 2-plane): 37%. - Mitral valve: Moderate MAC with thickened leaflets. There was mild to moderate regurgitation. - Left atrium: The atrium was normal in size. - Right atrium: The atrium was normal in size. - Tricuspid valve: There was mild regurgitation. - Pulmonary arteries: PA peak pressure: 35 mm Hg (S). - Inferior vena cava: The vessel was normal in size. The respirophasic diameter changes were in the normal range (>= 50%), consistent with normal central venous  pressure.  Impressions:  - Compared to a prior study in 03/2018, the LVEF is lower at 35-40% with inferolateral akinesis and pseudo-dyskinesis of the posterior wall.  Patient Profile     [age].o. female with a hx of nonobstructive CAD by cath 2004 (60% LAD), presumed NICM EF 40%, asthma, breast CA (mastectomy/Adriamycin), Hodgkin's disease, obesity, PAD s/p stent to LCIA 2006, thyroid carcinoma, CKD stage III by labs(unknown to patient)who was seen for the evaluation ofNSTEMI.   Assessment & Plan    1.NSTEMI with prior nonobstructive CAD (suspect now obstructive): -  Cath performed 07/30/18 which revealed nonobstructive proximal LAD 30%, mid to distal LAD relatively focal 50% stenosis, circumflex PDA is relatively small and could be a site of SACD although no definite diagnostic features -Recommendations for uninterrupted DAPT with Aspirin 47m daily and Clopidogrel 777mdailyfor a minimum of 6 months per cath report -Peak troponin, 11.85>>11.13>>10.03>>denies chest pain  -Cath site unremarkable, right groin -Continue ASA, statin, carvedilol -Plavix added 07/31/18 given the recommendations per cath note  -Add Spiro 12.5 today given stable BP  -Losartan added 07/31/18 in anticipation of starting Entresto in the OP setting   2. Prior h/oNICM with elevated BNP/chronic combined CHF: -Elevated BNP on admission without significant volume overload on exam -Weight, 189lb today>>192lb on admission  -I&O, net positive 3.2 since admission  -Will change home lisinopril to losartan with plans for possible Entresto initiation  -Will add home dose lasix at d/c   3. Acute kidney injury superimposed on CKD stage III: -Creatinine, 1.20 07/31/18>>1.62 07/30/18>>will obtain BMET prior to discharge  -Renal [place]pleted with right inferior pelvic calcification/stone versus peripherally calcified central renal mass. Recommend further evaluation with CT scan of the abdomen and pelvis.   -Prior  to admission she was on a low dose of Lasix 20 mg a day, lisinopril 5 mg a day and Aldactone 12.5 mg a day  4. Abnormalities noted on CT scan with ground glass nodules and thickening of gastric wallplus pelvic mass with hx of prior CA: -This will need follow-upandoutpatient work-up -Right inferior pelvic calcification/stone versus peripherally calcified central renal mass>>>recommend further evaluation with follow up  CT scan of the abdomen and pelvis. Will order abdominal CT with contrast prior to discharge.  -Ground-glass nodules in the left upper lobe, the larger measuring 85m35mith non-contrast chest CT recommended at 3-6 months  -Discussed findings and recommendations  with patient 07/31/18 -Pt has followed with Dr. KatDelton Coombesr oncology issues in the past. Has an appointment on 04/10/2019 at 1015am.    5. Asthma: -Stable on Pulmicort nebs twice daily -Prednisone 10 mg initially>>5 mg daily for 3 days  6. Hyperlipidemia: -Continue Lipitor  -Recheck lipids and liver functions in 8 weeks  7. Hypocalcemia: -Calcium was 3.1 on admission with no prior values in the system. -PTH borderline low at 17 -Received 1 amp of calcium gluconate with improvement  -Magnesium was 1.9, phosphorus was elevated at 7.5 and albumin was low at 2.9. -Follow-up with PCP after discharge, may need further treatment -Will d/c on [place] with D 500-200m102migned, JillKathyrn DrownC [facility] Pager: [phone]/2019, 9:15 AM     Patient seen and examined. Agree with assessment and plan. Feels well, no chest pain. I had a long discussion with son and patient and summarized hospitalization and findings.  Now that renal fxn improved back on losartan and spiro with LV dysfunction.  Will plan to obtain abdominal CT today to re-assess renal calcification vs stone vs central mass. [place]  Will need f/u with Dr. McDoDomenic Polite oncology in early  December. Ok for dc lBrink's Companyer today after CT.   ThomTroy Sine, FACCSquaw Peak Surgical Facility Inc7/2019 10:19 AM   For questions or updates, please contact   Please consult [email or web address] for contact info under Cardiology/STEMI.

## 2018-08-01 NOTE — Progress Notes (Signed)
IV's and telemetry discontinued at this time. CCMD notified. Discharge instructions reviewed with patient and patient's son. All questions answered.   Emelda Fear, RN

## 2018-08-01 NOTE — Care Management Note (Signed)
Case Management Note Marvetta Gibbons RN, BSN Transitions of Care Unit 4E- RN Case Manager [phone]  Patient Details  Name: [name] MRN: [id number] Date of Birth: 1930-05-21  Subjective/Objective:   Pt admitted with NSTEMI,  AKI                 Action/Plan: PTA pt lived at home, was independent with ADLs, CM to follow for transition of care needs   Expected Discharge Date:  08/01/18               Expected Discharge Plan:  Home/Self Care  In-House Referral:  NA  Discharge planning Services  CM Consult  Post Acute Care Choice:  NA Choice offered to:  NA  DME Arranged:    DME Agency:     HH Arranged:    [place] Agency:     Status of Service:  Completed, signed off  If discussed at [employer] of Stay Meetings, dates discussed:    Discharge Disposition: home/self care   Additional Comments:  08/01/18- [place] RN, CM- pt for transition home today, no CM needs noted.   Dawayne Patricia, RN 08/01/2018, 1:49 PM

## 2018-08-30 ENCOUNTER — Ambulatory Visit (HOSPITAL_COMMUNITY): Payer: Medicare HMO | Admitting: Hematology

## 2018-08-30 ENCOUNTER — Other Ambulatory Visit: Payer: Self-pay | Admitting: *Deleted

## 2018-08-30 MED ORDER — ATORVASTATIN CALCIUM 80 MG PO TABS: 80.0000 mg | ORAL_TABLET | Freq: Every day | ORAL | 1 refills | Status: DC

## 2018-09-02 ENCOUNTER — Other Ambulatory Visit: Payer: Self-pay | Admitting: *Deleted

## 2018-09-02 MED ORDER — CLOPIDOGREL BISULFATE 75 MG PO TABS: 75.0000 mg | ORAL_TABLET | Freq: Every day | ORAL | 2 refills | Status: DC

## 2018-09-05 ENCOUNTER — Other Ambulatory Visit: Payer: Self-pay

## 2018-09-05 MED ORDER — CLOPIDOGREL BISULFATE 75 MG PO TABS: 75.0000 mg | ORAL_TABLET | Freq: Every day | ORAL | 2 refills | Status: DC

## 2018-09-05 NOTE — Telephone Encounter (Signed)
90 day supply requested for plavix for pt, refilled

## 2018-09-09 ENCOUNTER — Ambulatory Visit: Payer: Medicare HMO | Admitting: Physician Assistant

## 2018-09-16 ENCOUNTER — Other Ambulatory Visit (HOSPITAL_COMMUNITY): Payer: Self-pay | Admitting: Family Medicine

## 2018-09-16 DIAGNOSIS — Z1231 Encounter for screening mammogram for malignant neoplasm of breast: Secondary | ICD-10-CM

## 2018-10-01 ENCOUNTER — Inpatient Hospital Stay (HOSPITAL_COMMUNITY): Payer: Medicare HMO | Attending: Hematology | Admitting: Hematology

## 2018-10-01 ENCOUNTER — Encounter (HOSPITAL_COMMUNITY): Payer: Self-pay | Admitting: Hematology

## 2018-10-01 ENCOUNTER — Other Ambulatory Visit: Payer: Self-pay

## 2018-10-01 VITALS — BP 132/78 | HR 77 | Temp 98.4°F | Resp 16 | Wt 195.2 lb

## 2018-10-01 DIAGNOSIS — Z9223 Personal history of estrogen therapy: Secondary | ICD-10-CM | POA: Diagnosis not present

## 2018-10-01 DIAGNOSIS — Z170 Estrogen receptor positive status [ER+]: Secondary | ICD-10-CM | POA: Diagnosis not present

## 2018-10-01 DIAGNOSIS — I739 Peripheral vascular disease, unspecified: Secondary | ICD-10-CM | POA: Diagnosis not present

## 2018-10-01 DIAGNOSIS — Z923 Personal history of irradiation: Secondary | ICD-10-CM | POA: Diagnosis not present

## 2018-10-01 DIAGNOSIS — E039 Hypothyroidism, unspecified: Secondary | ICD-10-CM | POA: Diagnosis not present

## 2018-10-01 DIAGNOSIS — Z86718 Personal history of other venous thrombosis and embolism: Secondary | ICD-10-CM | POA: Insufficient documentation

## 2018-10-01 DIAGNOSIS — I129 Hypertensive chronic kidney disease with stage 1 through stage 4 chronic kidney disease, or unspecified chronic kidney disease: Secondary | ICD-10-CM | POA: Diagnosis not present

## 2018-10-01 DIAGNOSIS — N183 Chronic kidney disease, stage 3 (moderate): Secondary | ICD-10-CM | POA: Insufficient documentation

## 2018-10-01 DIAGNOSIS — Z85850 Personal history of malignant neoplasm of thyroid: Secondary | ICD-10-CM | POA: Insufficient documentation

## 2018-10-01 DIAGNOSIS — M810 Age-related osteoporosis without current pathological fracture: Secondary | ICD-10-CM | POA: Diagnosis not present

## 2018-10-01 DIAGNOSIS — Z853 Personal history of malignant neoplasm of breast: Secondary | ICD-10-CM | POA: Insufficient documentation

## 2018-10-01 DIAGNOSIS — Z7982 Long term (current) use of aspirin: Secondary | ICD-10-CM | POA: Diagnosis not present

## 2018-10-01 DIAGNOSIS — I252 Old myocardial infarction: Secondary | ICD-10-CM | POA: Diagnosis not present

## 2018-10-01 DIAGNOSIS — Z7902 Long term (current) use of antithrombotics/antiplatelets: Secondary | ICD-10-CM | POA: Diagnosis not present

## 2018-10-01 DIAGNOSIS — Z79899 Other long term (current) drug therapy: Secondary | ICD-10-CM | POA: Diagnosis not present

## 2018-10-01 DIAGNOSIS — C8100 Nodular lymphocyte predominant Hodgkin lymphoma, unspecified site: Secondary | ICD-10-CM | POA: Diagnosis present

## 2018-10-01 DIAGNOSIS — G4700 Insomnia, unspecified: Secondary | ICD-10-CM | POA: Insufficient documentation

## 2018-10-01 DIAGNOSIS — I2510 Atherosclerotic heart disease of native coronary artery without angina pectoris: Secondary | ICD-10-CM | POA: Diagnosis not present

## 2018-10-01 NOTE — Patient Instructions (Addendum)
[place] at [facility] Discharge Instructions  Follow up in 1 year with labs    Thank you for choosing [place] at [facility] to provide your oncology and hematology care.  To afford each patient quality time with our provider, please arrive at least 15 minutes before your scheduled appointment time.   If you have a lab appointment with the [facility] please come in thru the  Main Entrance and check in at the main information desk  You need to re-schedule your appointment should you arrive 10 or more minutes late.  We strive to give you quality time with our providers, and arriving late affects you and other patients whose appointments are after yours.  Also, if you no show three or more times for appointments you may be dismissed from the clinic at the providers discretion.     Again, thank you for choosing [facility].  Our hope is that these requests will decrease the amount of time that you wait before being seen by our physicians.       _____________________________________________________________  Should you have questions after your visit to [facility], please contact our office at [phone] between the hours of 8:00 a.m. and 4:30 p.m.  Voicemails left after 4:00 p.m. will not be returned until the following business day.  For prescription refill requests, have your pharmacy contact our office and allow 72 hours.    Cancer Center Support Programs:   > Cancer Support Group  2nd Tuesday of the month 1pm-2pm, Journey Room

## 2018-10-01 NOTE — Progress Notes (Signed)
[name], [place] [id number]   CLINIC:  Medical Oncology/Hematology  PCP:  Christine Evens, MD [name] [place] [address] [phone]   REASON FOR VISIT: Follow-up for Stage II right breast cancer diagnosed in [place] Thyroid carcinoma   CURRENT THERAPY: Observation.   INTERVAL HISTORY:  [name] 85 y.o. female returns for routine follow-up for right breast cancer, Hodgkin lymphoma, and thyroid cancer. She is doing well with no complaints at this time. She does occasionally have problems sleeping but otherwise she feels great. She just returned from her sons house in [place] for a vacation. She lives alone and performs all her own ADLs and activities. Denies any nausea, vomiting, or diarrhea. Denies any new pains. Had not noticed any recent bleeding such as epistaxis, hematuria or hematochezia. Denies recent chest pain on exertion, shortness of breath on minimal exertion, pre-syncopal episodes, or palpitations. Denies any numbness or tingling in hands or feet. Denies any recent fevers, infections, or recent hospitalizations. She reports her appetite at 100% and her energy level at 50%.     REVIEW OF SYSTEMS:  Review of Systems  Psychiatric/Behavioral: Positive for sleep disturbance.  All other systems reviewed and are negative.    PAST MEDICAL/SURGICAL HISTORY:  Past Medical History:  Diagnosis Date  . Asthma   . Breast cancer, right breast ([place])   . CKD (chronic kidney disease), stage III ([place])    [name] 07/29/2018  . Coronary atherosclerosis    Cardiac catheterization 2004 with 60% mid LAD  . DVT (deep venous thrombosis) ([place])   . Essential hypertension   . History of breast cancer 1987   Right-sided, mastectomy with axillary node dissection and Adriamycin   . History of thyroid cancer   . Hodgkin lymphoma ([place]) 04/03/2017  . Hodgkin's disease ([place])   . Hypothyroidism   . Nonischemic  cardiomyopathy (HCC)    LVEF improved to 50-55% as of 2014  . NSTEMI (non-ST elevated myocardial infarction) ([place]) 07/27/2018  . Obesity   . Osteoporosis   . Peripheral arterial disease ([place])    Stent to the left common iliac in 2006  . Thyroid carcinoma ([place]) 04/03/2017   Past Surgical History:  Procedure Laterality Date  . ABDOMINAL HYSTERECTOMY    . ANGIOPLASTY / STENTING FEMORAL Left 07/13/2005  . APPENDECTOMY    . BUNIONECTOMY Right   . CATARACT EXTRACTION W/PHACO  05/28/2012   Procedure: CATARACT EXTRACTION PHACO AND INTRAOCULAR LENS PLACEMENT (IOC);  Surgeon: Christine Guadeloupe T. Gershon Crane, MD;  Location: [facility];  Service: Ophthalmology;  Laterality: Right;  CDE 12.86  . CATARACT EXTRACTION W/PHACO  06/04/2012   Procedure: CATARACT EXTRACTION PHACO AND INTRAOCULAR LENS PLACEMENT (IOC);  Surgeon: Christine Guadeloupe T. Gershon Crane, MD;  Location: [facility];  Service: Ophthalmology;  Laterality: Left;  CDE:15.32  . COLONOSCOPY    . LEFT HEART CATH AND CORONARY ANGIOGRAPHY N/A 07/30/2018   Procedure: LEFT HEART CATH AND CORONARY ANGIOGRAPHY;  Surgeon: Christine Crome, MD;  Location: [facility];  Service: Cardiovascular;  Laterality: N/A;  . MASTECTOMY PARTIAL / LUMPECTOMY W/ AXILLARY LYMPHADENECTOMY Right 1987  . THYROIDECTOMY    . TONSILLECTOMY    . YAG LASER APPLICATION Right 62/83/1517   Procedure: YAG LASER APPLICATION;  Surgeon: Christine Guys, MD;  Location: [facility];  Service: Ophthalmology;  Laterality: Right;  . YAG LASER APPLICATION Left 02/24/6072   Procedure: YAG LASER APPLICATION;  Surgeon: Christine Guys, MD;  Location: [facility];  Service: Ophthalmology;  Laterality: Left;     SOCIAL HISTORY:  Social History   Socioeconomic History  . Marital status: Widowed    Spouse name: Not on file  . Number of children: Not on file  . Years of education: Not on file  . Highest education level: Not on file  Occupational History  . Not on file  Social Needs  . Financial resource strain: Not on file  . Food  insecurity:    Worry: Not on file    Inability: Not on file  . Transportation needs:    Medical: Not on file    Non-medical: Not on file  Tobacco Use  . Smoking status: Never Smoker  . Smokeless tobacco: Never Used  Substance and Sexual Activity  . Alcohol use: No  . Drug use: No  . Sexual activity: Not Currently    Birth control/protection: Surgical  Lifestyle  . Physical activity:    Days per week: Not on file    Minutes per session: Not on file  . Stress: Not on file  Relationships  . Social connections:    Talks on phone: Not on file    Gets together: Not on file    Attends religious service: Not on file    Active member of club or organization: Not on file    Attends meetings of clubs or organizations: Not on file    Relationship status: Not on file  . Intimate partner violence:    Fear of current or ex partner: Not on file    Emotionally abused: Not on file    Physically abused: Not on file    Forced sexual activity: Not on file  Other Topics Concern  . Not on file  Social History Narrative  . Not on file    FAMILY HISTORY:  Family History  Problem Relation Age of Onset  . Lung cancer Mother   . Heart attack Father     CURRENT MEDICATIONS:  Outpatient Encounter Medications as of 10/01/2018  Medication Sig  . aspirin 81 MG tablet Take 81 mg by mouth daily.  Marland Kitchen atorvastatin (LIPITOR) 80 MG tablet Take 1 tablet (80 mg total) by mouth daily at 6 PM.  . calcium-vitamin D (OSCAL WITH D) 500-200 MG-UNIT tablet Take 1 tablet by mouth 3 (three) times daily.  . carvedilol (COREG) 3.125 MG tablet Take 1 tablet (3.125 mg total) by mouth 2 (two) times daily.  . clopidogrel (PLAVIX) 75 MG tablet Take 1 tablet (75 mg total) by mouth daily.  Marland Kitchen FLOVENT HFA 44 MCG/ACT inhaler Inhale 2 puffs into the lungs 2 (two) times daily.  . furosemide (LASIX) 20 MG tablet Take 1 tablet (20 mg total) by mouth daily.  Marland Kitchen levothyroxine (SYNTHROID, LEVOTHROID) 100 MCG tablet Take 100 mcg by  mouth daily.  Marland Kitchen losartan (COZAAR) 25 MG tablet Take 0.5 tablets (12.5 mg total) by mouth daily.  . Multiple Vitamins-Calcium (ONE-A-DAY WOMENS PO) Take 1 tablet by mouth daily.  . temazepam (RESTORIL) 30 MG capsule Take 30 mg by mouth at bedtime.   . vitamin C (ASCORBIC ACID) 500 MG tablet Take 500 mg by mouth daily.  . nitroGLYCERIN (NITROSTAT) 0.4 MG SL tablet Place 1 tablet (0.4 mg total) under the tongue every 5 (five) minutes x 3 doses as needed for chest pain. (Patient not taking: Reported on 10/01/2018)  . [DISCONTINUED] diphenhydramine-acetaminophen (TYLENOL PM) 25-500 MG TABS Take 1 tablet by mouth at bedtime as needed (sleep).   . [DISCONTINUED] predniSONE (DELTASONE)  10 MG tablet Take 2 tablets (20 mg total) by mouth daily.  . [DISCONTINUED] spironolactone (ALDACTONE) 25 MG tablet Take 0.5 tablets (12.5 mg total) by mouth daily.   No facility-administered encounter medications on file as of 10/01/2018.     ALLERGIES:  No Known Allergies   PHYSICAL EXAM:  ECOG Performance status: 1  Vitals:   10/01/18 1425  BP: 132/78  Pulse: 77  Resp: 16  Temp: 98.4 F (36.9 C)  SpO2: 97%   Filed Weights   10/01/18 1425  Weight: 195 lb 3.2 oz (88.5 kg)    Physical Exam Constitutional:      Appearance: Normal appearance. She is normal weight.  Cardiovascular:     Rate and Rhythm: Normal rate and regular rhythm.     Heart sounds: Normal heart sounds.  Pulmonary:     Effort: Pulmonary effort is normal.     Breath sounds: Normal breath sounds.  Abdominal:     General: Abdomen is flat.     Palpations: Abdomen is soft.  Musculoskeletal: Normal range of motion.  Skin:    General: Skin is warm and dry.  Neurological:     Mental Status: She is alert and oriented to person, place, and time. Mental status is at baseline.  Psychiatric:        Mood and Affect: Mood normal.        Behavior: Behavior normal.        Thought Content: Thought content normal.        Judgment: Judgment  normal.   Breast: No palpable masses, no skin changes or nipple discharge, no adenopathy.   LABORATORY DATA:  I have reviewed the labs as listed.  CBC    Component Value Date/Time   WBC 7.5 08/01/2018 0303   RBC 3.73 (L) 08/01/2018 0303   HGB 10.4 (L) 08/01/2018 0303   HCT 33.9 (L) 08/01/2018 0303   PLT 172 08/01/2018 0303   MCV 90.9 08/01/2018 0303   MCH 27.9 08/01/2018 0303   MCHC 30.7 08/01/2018 0303   RDW 15.4 08/01/2018 0303   LYMPHSABS 1.0 07/27/2018 1904   MONOABS 0.5 07/27/2018 1904   EOSABS 0.0 07/27/2018 1904   BASOSABS 0.0 07/27/2018 1904   CMP Latest Ref Rng & Units 08/01/2018 07/31/2018 07/30/2018  Glucose 70 - 99 mg/dL 94 95 94  BUN 8 - 23 mg/dL 25(H) 29(H) 37(H)  Creatinine 0.44 - 1.00 mg/dL 1.52(H) 1.20(H) 1.62(H)  Sodium 135 - 145 mmol/L 139 139 140  Potassium 3.5 - 5.1 mmol/L 4.7 4.1 4.2  Chloride 98 - 111 mmol/L 105 107 108  CO2 22 - 32 mmol/L 26 25 24   Calcium 8.9 - 10.3 mg/dL 7.3(L) 6.7(L) 6.4(LL)  Total Protein 6.5 - 8.1 g/dL - - -  Total Bilirubin 0.3 - 1.2 mg/dL - - -  Alkaline Phos 38 - 126 U/L - - -  AST 15 - 41 U/L - - -  ALT 0 - 44 U/L - - -       DIAGNOSTIC IMAGING:  I have independently reviewed the scans and discussed with the patient.   I have reviewed Francene Finders, NP's note and agree with the documentation.  I personally performed a face-to-face visit, made revisions and my assessment and plan is as follows.    ASSESSMENT & PLAN:   Hodgkin lymphoma ([place]) 1.  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 - Today's physical examination did not reveal any palpable adenopathy.  She does not have any B symptoms. -Her blood work was within normal limits. -She will come back in 1 year for follow-up.  2.  Stage II right breast cancer: -Diagnosed in 1987, status post right lumpectomy and axillary lymph node dissection at [facility].  One lymph node was  positive. -Adjuvant radiation therapy and tamoxifen. -Last mammogram on 10/22/2017 was BI-RADS Category 1. -Today's physical examination did not reveal any palpable masses.  Right breast lumpectomy site is normal. -She has an appointment for mammogram end of this month.  3.  Thyroid cancer: -Status post near total thyroidectomy.      Orders placed this encounter:  Orders Placed This Encounter  Procedures  . CBC with Differential/Platelet  . Comprehensive metabolic panel      Derek Jack, MD [place] [phone]

## 2018-10-01 NOTE — Assessment & Plan Note (Signed)
1.  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 - Today's physical examination did not reveal any palpable adenopathy.  She does not have any B symptoms. -Her blood work was within normal limits. -She will come back in 1 year for follow-up.  2.  Stage II right breast cancer: -Diagnosed in 1987, status post right lumpectomy and axillary lymph node dissection at [facility].  One lymph node was positive. -Adjuvant radiation therapy and tamoxifen. -Last mammogram on 10/22/2017 was BI-RADS Category 1. -Today's physical examination did not reveal any palpable masses.  Right breast lumpectomy site is normal. -She has an appointment for mammogram end of this month.  3.  Thyroid cancer: -Status post near total thyroidectomy.

## 2018-10-15 ENCOUNTER — Ambulatory Visit: Payer: Medicare HMO | Admitting: Cardiology

## 2018-10-15 ENCOUNTER — Telehealth: Payer: Self-pay

## 2018-10-15 ENCOUNTER — Encounter: Payer: Self-pay | Admitting: Cardiology

## 2018-10-15 ENCOUNTER — Other Ambulatory Visit (HOSPITAL_COMMUNITY)
Admission: RE | Admit: 2018-10-15 | Discharge: 2018-10-15 | Disposition: A | Discharge status: Home / Self Care | Payer: Medicare HMO | Source: Ambulatory Visit | Attending: Cardiology | Admitting: Cardiology

## 2018-10-15 VITALS — BP 126/74 | HR 82 | Ht 69.0 in | Wt 198.0 lb

## 2018-10-15 DIAGNOSIS — I428 Other cardiomyopathies: Secondary | ICD-10-CM

## 2018-10-15 DIAGNOSIS — I10 Essential (primary) hypertension: Secondary | ICD-10-CM

## 2018-10-15 DIAGNOSIS — I25119 Atherosclerotic heart disease of native coronary artery with unspecified angina pectoris: Secondary | ICD-10-CM | POA: Diagnosis not present

## 2018-10-15 DIAGNOSIS — Z79899 Other long term (current) drug therapy: Secondary | ICD-10-CM

## 2018-10-15 DIAGNOSIS — E782 Mixed hyperlipidemia: Secondary | ICD-10-CM

## 2018-10-15 LAB — BASIC METABOLIC PANEL
Anion gap: 9 (ref 5–15)
BUN: 28 mg/dL — AB (ref 8–23)
CALCIUM: 7.4 mg/dL — AB (ref 8.9–10.3)
CO2: 28 mmol/L (ref 22–32)
CREATININE: 1.64 mg/dL — AB (ref 0.44–1.00)
Chloride: 103 mmol/L (ref 98–111)
GFR, EST AFRICAN AMERICAN: 32 mL/min — AB (ref >60)
GFR, EST NON AFRICAN AMERICAN: 28 mL/min — AB (ref >60)
Glucose, Bld: 91 mg/dL (ref 70–99)
Potassium: 4.7 mmol/L (ref 3.5–5.1)
SODIUM: 140 mmol/L (ref 135–145)

## 2018-10-15 MED ORDER — FUROSEMIDE 20 MG PO TABS: 20.0000 mg | ORAL_TABLET | Freq: Every day | ORAL | 3 refills | Status: DC

## 2018-10-15 MED ORDER — FUROSEMIDE 20 MG PO TABS: ORAL_TABLET | ORAL | 3 refills | Status: DC

## 2018-10-15 NOTE — Patient Instructions (Signed)
Medication Instructions:  Take Lasix 20 mg daily If you need a refill on your cardiac medications before your next appointment, please call your pharmacy.   Lab work: BMET today If you have labs (blood work) drawn today and your tests are completely normal, you will receive your results only by: Marland Kitchen MyChart Message (if you have MyChart) OR . A paper copy in the mail If you have any lab test that is abnormal or we need to change your treatment, we will call you to review the results.  Testing/Procedures: None today  Follow-Up: At [facility], you and your health needs are our priority.  As part of our continuing mission to provide you with exceptional heart care, we have created designated Provider Care Teams.  These Care Teams include your primary Cardiologist (physician) and Advanced Practice Providers (APPs -  Physician Assistants and Nurse Practitioners) who all work together to provide you with the care you need, when you need it. You will need a follow up appointment in 6 weeks.  Please call our office 2 months in advance to schedule this appointment.  You may see one of the following Advanced Practice Providers on your designated Care Team:   [place], PA-C [facility]) . Ermalinda Barrios, PA-C ([place] [facility])  Any Other Special Instructions Will Be Listed Below (If Applicable). NONE

## 2018-10-15 NOTE — Progress Notes (Signed)
Cardiology Office Note  Date: 10/15/2018   ID: [name], DOB Feb 10, 1930, MRN [id number]  PCP: Lemmie Evens, MD  Primary Cardiologist: Rozann Lesches, MD   Chief Complaint  Patient presents with  . Coronary Artery Disease    History of Present Illness: [name] is an 85 y.o. female seen in July 2019.  I reviewed interval records.  She was hospitalized in November 2019 with shortness of breath and NSTEMI that was managed medically without obvious culprit to require revascularization based on cardiac catheterization report detailed below.  Peak troponin I was 11.85.  Follow-up echocardiogram is also outlined below.  She presents today for follow-up.  She spent some time in [place] living with her son after hospital discharge.  In going over her medications, she realized that she has not been taking Lasix.  She does not report any increasing breathlessness however although her weight is up a few pounds.  She is back to exercising, generally walking and using a stationary bicycle.  She also gets in the pool to exercise twice a week.  She has had no palpitations or syncope.  She has not had follow-up lab work which was arranged at hospital discharge as well.  Past Medical History:  Diagnosis Date  . Asthma   . Breast cancer, right breast ([place])   . CKD (chronic kidney disease), stage III ([place])   . Coronary atherosclerosis    Cardiac catheterization 2004 with 60% mid LAD  . DVT (deep venous thrombosis) ([place])   . Essential hypertension   . History of breast cancer 1987   Right-sided, mastectomy with axillary node dissection and Adriamycin   . History of thyroid cancer   . Hodgkin lymphoma ([place]) 04/03/2017  . Hodgkin's disease ([place])   . Hypothyroidism   . Nonischemic cardiomyopathy ([facility])    LVEF improved to 50-55% as of 2014  . NSTEMI (non-ST elevated myocardial infarction) ([place]) 07/27/2018  . Obesity   . Osteoporosis   . Peripheral arterial disease ([place])    Stent to the left common iliac in 2006  . Thyroid carcinoma ([place]) 04/03/2017    Past Surgical History:  Procedure Laterality Date  . ABDOMINAL HYSTERECTOMY    . ANGIOPLASTY / STENTING FEMORAL Left 07/13/2005  . APPENDECTOMY    . BUNIONECTOMY Right   . CATARACT EXTRACTION W/PHACO  05/28/2012   Procedure: CATARACT EXTRACTION PHACO AND INTRAOCULAR LENS PLACEMENT (IOC);  Surgeon: Elta Guadeloupe T. Gershon [facility], MD;  Location: [facility];  Service: Ophthalmology;  Laterality: Right;  CDE 12.86  . CATARACT EXTRACTION W/PHACO  06/04/2012   Procedure: CATARACT EXTRACTION PHACO AND INTRAOCULAR LENS PLACEMENT (IOC);  Surgeon: Elta Guadeloupe T. Gershon [facility], MD;  Location: [facility];  Service: Ophthalmology;  Laterality: Left;  CDE:15.32  . COLONOSCOPY    . LEFT HEART CATH AND CORONARY ANGIOGRAPHY N/A 07/30/2018   Procedure: LEFT HEART CATH AND CORONARY ANGIOGRAPHY;  Surgeon: Belva Crome, MD;  Location: [facility];  Service: Cardiovascular;  Laterality: N/A;  . MASTECTOMY PARTIAL / LUMPECTOMY W/ AXILLARY LYMPHADENECTOMY Right 1987  . THYROIDECTOMY    . TONSILLECTOMY    . YAG LASER APPLICATION Right 67/89/3810   Procedure: YAG LASER APPLICATION;  Surgeon: Rutherford Guys, MD;  Location: [facility];  Service: Ophthalmology;  Laterality: Right;  . YAG LASER APPLICATION Left 10/01/5100   Procedure: YAG LASER APPLICATION;  Surgeon: Rutherford Guys, MD;  Location: [facility];  Service: Ophthalmology;  Laterality: Left;    Current Outpatient Medications  Medication Sig Dispense Refill  .  aspirin 81 MG tablet Take 81 mg by mouth daily.    Marland Kitchen atorvastatin (LIPITOR) 80 MG tablet Take 1 tablet (80 mg total) by mouth daily at 6 PM. 90 tablet 1  . calcium-vitamin D (OSCAL WITH D) 500-200 MG-UNIT tablet Take 1 tablet by mouth 3 (three) times daily. 90 tablet 1  . carvedilol (COREG) 3.125 MG tablet Take 1 tablet (3.125 mg total) by mouth 2 (two) times daily. 180 tablet 3  . clopidogrel (PLAVIX) 75 MG tablet Take 1 tablet (75 mg total) by mouth daily. 90  tablet 2  . FLOVENT HFA 44 MCG/ACT inhaler Inhale 2 puffs into the lungs 2 (two) times daily.  11  . levothyroxine (SYNTHROID, LEVOTHROID) 100 MCG tablet Take 100 mcg by mouth daily.    Marland Kitchen losartan (COZAAR) 25 MG tablet Take 0.5 tablets (12.5 mg total) by mouth daily. 30 tablet 1  . Multiple Vitamins-Calcium (ONE-A-DAY WOMENS PO) Take 1 tablet by mouth daily.    . nitroGLYCERIN (NITROSTAT) 0.4 MG SL tablet Place 1 tablet (0.4 mg total) under the tongue every 5 (five) minutes x 3 doses as needed for chest pain. 25 tablet 1  . spironolactone (ALDACTONE) 25 MG tablet Take 12.5 mg by mouth daily.    . temazepam (RESTORIL) 30 MG capsule Take 30 mg by mouth at bedtime.     . vitamin C (ASCORBIC ACID) 500 MG tablet Take 500 mg by mouth daily.    . furosemide (LASIX) 20 MG tablet Take 1 tablet (20 mg total) by mouth daily. 90 tablet 3   No current facility-administered medications for this visit.    Allergies:  Patient has no known allergies.   Social History: The patient  reports that she has never smoked. She has never used smokeless tobacco. She reports that she does not drink alcohol or use drugs.   ROS:  Please see the history of present illness. Otherwise, complete review of systems is positive for hearing loss.  All other systems are reviewed and negative.   Physical Exam: VS:  BP 126/74 (BP Location: Right Arm)   Pulse 82   Ht _0  (1.753 m)   Wt 198 lb (89.8 kg)   SpO2 95%   BMI 29.24 kg/m , BMI Body mass index is 29.24 kg/m.  Wt Readings from Last 3 Encounters:  10/15/18 198 lb (89.8 kg)  10/01/18 195 lb 3.2 oz (88.5 kg)  08/01/18 191 lb 11.2 oz (87 kg)    General: Elderly woman, appears comfortable at rest. HEENT: Conjunctiva and lids normal, oropharynx clear. Neck: Supple, no elevated JVP or carotid bruits, no thyromegaly. Lungs: Clear to auscultation, nonlabored breathing at rest. Cardiac: Regular rate and rhythm, no S3, soft systolic murmur. Abdomen: Soft, nontender,  bowel sounds present. Extremities: No pitting edema, distal pulses 2+. Skin: Warm and dry. Musculoskeletal: No kyphosis. Neuropsychiatric: Alert and oriented x3, affect grossly appropriate.  ECG: I personally reviewed the tracing from 07/27/2018 which shows sinus rhythm with left anterior fascicular block, borderline low voltage.  Recent Labwork: 07/27/2018: ALT 26; AST 35; B Natriuretic Peptide 899.0 07/28/2018: TSH 3.202 07/29/2018: Magnesium 1.9 08/01/2018: BUN 25; Creatinine, Ser 1.52; Hemoglobin 10.4; Platelets 172; Potassium 4.7; Sodium 139     Component Value Date/Time   CHOL 181 07/28/2018 0526   TRIG 68 07/28/2018 0526   HDL 58 07/28/2018 0526   CHOLHDL 3.1 07/28/2018 0526   VLDL 14 07/28/2018 0526   LDLCALC 109 (H) 07/28/2018 0526    Other Studies Reviewed Today:  Cardiac catheterization 07/30/2018:  Left dominant coronary anatomy.  Normal left main, short.  Nonobstructive proximal LAD 30% and somewhat hazy mid to distal LAD relatively focal 50% stenosis.  Dominant circumflex with large widely patent obtuse marginals.  Circumflex PDA is relatively small and could be a site of SACD although no definite diagnostic features  LVEDP is normal.  Ventriculography was not performed.  Myocardial infarction with no obstructive coronary artery disease ([facility])  RECOMMENDATIONS:   Consider dual antiplatelet therapy (aspirin and Plavix)  Beta-blocker therapy if appropriate.  IV heparin has been discontinued and subcutaneous heparin DVT prophylaxis started.  Echocardiogram 07/29/2018: Study Conclusions  - Left ventricle: The cavity size was normal. Wall thickness was   increased in a pattern of mild LVH. Systolic function was   moderately reduced. The estimated ejection fraction was in the   range of 35% to 40%. Basal inferior and lateral severe   hypokinesis and pseudo-dyskinesis (enhanced diaphragmatic motion)   of the posterior wall. Doppler parameters are  consistent with   abnormal left ventricular relaxation (grade 1 diastolic   dysfunction). The E/e&' ratio is >15, suggesting elevated LV   filling pressure. Ejection fraction (MOD, 2-plane): 37%. - Mitral valve: Moderate MAC with thickened leaflets. There was   mild to moderate regurgitation. - Left atrium: The atrium was normal in size. - Right atrium: The atrium was normal in size. - Tricuspid valve: There was mild regurgitation. - Pulmonary arteries: PA peak pressure: 35 mm Hg (S). - Inferior vena cava: The vessel was normal in size. The   respirophasic diameter changes were in the normal range (>= 50%),   consistent with normal central venous pressure.  Impressions:  - Compared to a prior study in 03/2018, the LVEF is lower at 35-40%   with inferolateral akinesis and pseudo-dyskinesis of the   posterior wall.  Assessment and Plan:  1.  Moderate, nonobstructive CAD as outlined above.  She is status post NSTEMI in November 2019 that was managed medically.  At this point she reports no angina symptoms and breathing status has returned to baseline.  She remains on antiplatelet regimen and statin.  2.  History of nonischemic cardiomyopathy, LVEF decreased at 35 to 40% by echocardiogram in November 2019.  Medications also include Coreg, Cozaar, Aldactone, and Lasix which was added at hospital discharge (although she has not been taking).  Follow-up BMET.  3.  Essential hypertension, blood pressure is well controlled today.  4.  Mixed hyperlipidemia on Zocor.  Current medicines were reviewed with the patient today.   Orders Placed This Encounter  Procedures  . Basic Metabolic Panel (BMET)    Disposition: Follow-up in 4 to 6 weeks.  Signed, Satira Sark, MD, [facility] 10/15/2018 2:47 PM    [place] [facility] [address] [place], [place] [address] Phone: [phone]; Fax: [phone]

## 2018-10-15 NOTE — Telephone Encounter (Signed)
Called [employer], lasix changed to prn, left message for patient to call back-cc

## 2018-10-15 NOTE — Telephone Encounter (Signed)
-----   Message from Satira Sark, MD sent at 10/15/2018  3:34 PM EST ----- Results reviewed.  Creatinine is up to 1.64 from 1.52 in November 2019.  For now I would ask her not to take the Lasix on a daily basis, only use this if she notices leg swelling or an increase in her weight of 2 to 3 pounds in 24 hours. A copy of this test should be forwarded to Lemmie Evens, MD.

## 2018-10-24 ENCOUNTER — Ambulatory Visit (HOSPITAL_COMMUNITY)
Admission: RE | Admit: 2018-10-24 | Discharge: 2018-10-24 | Disposition: A | Discharge status: Home / Self Care | Payer: Medicare HMO | Source: Ambulatory Visit | Attending: Family Medicine | Admitting: Family Medicine

## 2018-10-24 DIAGNOSIS — Z1231 Encounter for screening mammogram for malignant neoplasm of breast: Secondary | ICD-10-CM | POA: Diagnosis not present

## 2018-10-28 ENCOUNTER — Other Ambulatory Visit (HOSPITAL_COMMUNITY): Payer: Self-pay | Admitting: Family Medicine

## 2018-10-28 DIAGNOSIS — R921 Mammographic calcification found on diagnostic imaging of breast: Secondary | ICD-10-CM

## 2018-11-19 ENCOUNTER — Ambulatory Visit (HOSPITAL_COMMUNITY)
Admission: RE | Admit: 2018-11-19 | Discharge: 2018-11-19 | Disposition: A | Discharge status: Home / Self Care | Payer: Medicare HMO | Source: Ambulatory Visit | Attending: Family Medicine | Admitting: Family Medicine

## 2018-11-19 ENCOUNTER — Encounter (HOSPITAL_COMMUNITY): Payer: Medicare HMO

## 2018-11-19 DIAGNOSIS — R921 Mammographic calcification found on diagnostic imaging of breast: Secondary | ICD-10-CM | POA: Diagnosis present

## 2018-11-25 NOTE — Progress Notes (Signed)
Cardiology Office Note    Date:  11/26/2018   ID:  [name], DOB 1930/08/25, MRN [id number]  PCP:  Lemmie Evens, MD  Cardiologist: Rozann Lesches, MD    Chief Complaint  Patient presents with  . Follow-up    6 week visit    History of Present Illness:    [name] is a 85 y.o. female with past medical history of CAD (nonobstructive disease by cath in 07/2018 at time of NSTEMI), NICM (EF 35-40% by echo in 07/2018), HTN, HLD, and Stage 3 CKD who presents to the office today for 6-week follow-up.  She was last examined by Dr. Domenic Padilla on 10/15/2018 and this was her first cardiology follow-up since her NSTEMI in 07/2018 as she had been residing in [name] with family. She denied any recent chest pain or dyspnea on exertion and was swimming twice weekly without any exertional symptoms. She was continued on her current medication regimen at that time including ASA, Plavix, Coreg, Cozaar, and Aldactone. Follow-up labs were obtained and creatinine was elevated to 1.64, therefore it was recommended she only take Lasix as needed for edema or weight gain.  In talking with the patient today, she reports overall doing well from a cardiac perspective since her last office visit. She is very active at baseline and typically goes to the [facility] on a daily basis and exercises for 1 to 2 hours. Reports using aerobic machines or swimming in the pool. She denies any recent chest pain or dyspnea on exertion with these activities. No recent orthopnea, PND, lower extremity edema, or palpitations.  She does not weigh herself regularly but weight has declined by 5 pounds since her last office visit. She is unsure if she quit taking Lasix as recommended based off her recent labs.   Past Medical History:  Diagnosis Date  . Asthma   . Breast cancer, right breast ([place])   . CKD (chronic kidney disease), stage III ([place])   . Coronary atherosclerosis    a. cardiac catheterization in 2004 with 60%  mid LAD b. nonobstructive disease by cath in 07/2018 at time of NSTEMI  . DVT (deep venous thrombosis) ([name])   . Essential hypertension   . History of breast cancer 1987   Right-sided, mastectomy with axillary node dissection and Adriamycin   . History of thyroid cancer   . Hodgkin lymphoma ([name]) 04/03/2017  . Hodgkin's disease ([place])   . Hypothyroidism   . Nonischemic cardiomyopathy ([name])    a. EF 35-40% by echo in 07/2018  . NSTEMI (non-ST elevated myocardial infarction) ([name]) 07/27/2018  . Obesity   . Osteoporosis   . Peripheral arterial disease ([name])    Stent to the left common iliac in 2006  . Thyroid carcinoma ([name]) 04/03/2017    Past Surgical History:  Procedure Laterality Date  . ABDOMINAL HYSTERECTOMY    . ANGIOPLASTY / STENTING FEMORAL Left 07/13/2005  . APPENDECTOMY    . BUNIONECTOMY Right   . CATARACT EXTRACTION W/PHACO  05/28/2012   Procedure: CATARACT EXTRACTION PHACO AND INTRAOCULAR LENS PLACEMENT (IOC);  Surgeon: Christine Guadeloupe T. Gershon Crane, MD;  Location: [facility];  Service: Ophthalmology;  Laterality: Right;  CDE 12.86  . CATARACT EXTRACTION W/PHACO  06/04/2012   Procedure: CATARACT EXTRACTION PHACO AND INTRAOCULAR LENS PLACEMENT (IOC);  Surgeon: Christine Guadeloupe T. Gershon Crane, MD;  Location: [facility];  Service: Ophthalmology;  Laterality: Left;  CDE:15.32  . COLONOSCOPY    . LEFT HEART CATH AND CORONARY ANGIOGRAPHY N/A 07/30/2018   Procedure:  LEFT HEART CATH AND CORONARY ANGIOGRAPHY;  Surgeon: [place] Crome, MD;  Location: [facility];  Service: Cardiovascular;  Laterality: N/A;  . MASTECTOMY PARTIAL / LUMPECTOMY W/ AXILLARY LYMPHADENECTOMY Right 1987  . THYROIDECTOMY    . TONSILLECTOMY    . YAG LASER APPLICATION Right 31/49/7026   Procedure: YAG LASER APPLICATION;  Surgeon: Christine Guys, MD;  Location: [facility];  Service: Ophthalmology;  Laterality: Right;  . YAG LASER APPLICATION Left 11/30/8586   Procedure: YAG LASER APPLICATION;  Surgeon: [place] Guys, MD;  Location: [facility];  Service:  Ophthalmology;  Laterality: Left;    Current Medications: Outpatient Medications Prior to Visit  Medication Sig Dispense Refill  . aspirin 81 MG tablet Take 81 mg by mouth daily.    Marland Kitchen atorvastatin (LIPITOR) 80 MG tablet Take 1 tablet (80 mg total) by mouth daily at 6 PM. 90 tablet 1  . calcium-vitamin D (OSCAL WITH D) 500-200 MG-UNIT tablet Take 1 tablet by mouth 3 (three) times daily. 90 tablet 1  . carvedilol (COREG) 3.125 MG tablet Take 1 tablet (3.125 mg total) by mouth 2 (two) times daily. 180 tablet 3  . clopidogrel (PLAVIX) 75 MG tablet Take 1 tablet (75 mg total) by mouth daily. 90 tablet 2  . FLOVENT HFA 44 MCG/ACT inhaler Inhale 2 puffs into the lungs 2 (two) times daily.  11  . levothyroxine (SYNTHROID, LEVOTHROID) 100 MCG tablet Take 100 mcg by mouth daily.    Marland Kitchen losartan (COZAAR) 25 MG tablet Take 0.5 tablets (12.5 mg total) by mouth daily. 30 tablet 1  . Multiple Vitamins-Calcium (ONE-A-DAY WOMENS PO) Take 1 tablet by mouth daily.    . nitroGLYCERIN (NITROSTAT) 0.4 MG SL tablet Place 1 tablet (0.4 mg total) under the tongue every 5 (five) minutes x 3 doses as needed for chest pain. 25 tablet 1  . spironolactone (ALDACTONE) 25 MG tablet Take 12.5 mg by mouth daily.    . temazepam (RESTORIL) 30 MG capsule Take 30 mg by mouth at bedtime.     . vitamin C (ASCORBIC ACID) 500 MG tablet Take 500 mg by mouth daily.    . furosemide (LASIX) 20 MG tablet Take only for leg swelling or 2 lbs weight gain overnite (Patient not taking: Reported on 11/26/2018) 30 tablet 3   No facility-administered medications prior to visit.      Allergies:   Patient has no known allergies.   Social History   Socioeconomic History  . Marital status: Widowed    Spouse name: Not on file  . Number of children: Not on file  . Years of education: Not on file  . Highest education level: Not on file  Occupational History  . Not on file  Social Needs  . Financial resource strain: Not on file  . Food  insecurity:    Worry: Not on file    Inability: Not on file  . Transportation needs:    Medical: Not on file    Non-medical: Not on file  Tobacco Use  . Smoking status: Never Smoker  . Smokeless tobacco: Never Used  Substance and Sexual Activity  . Alcohol use: No  . Drug use: No  . Sexual activity: Not Currently    Birth control/protection: Surgical  Lifestyle  . Physical activity:    Days per week: Not on file    Minutes per session: Not on file  . Stress: Not on file  Relationships  . Social connections:    Talks on phone: Not  on file    Gets together: Not on file    Attends religious service: Not on file    Active member of club or organization: Not on file    Attends meetings of clubs or organizations: Not on file    Relationship status: Not on file  Other Topics Concern  . Not on file  Social History Narrative  . Not on file     Family History:  The patient's family history includes Heart attack in her father; Lung cancer in her mother.   Review of Systems:   Please see the history of present illness.     General:  No chills, fever, night sweats or weight changes.  Cardiovascular:  No chest pain, dyspnea on exertion, edema, orthopnea, palpitations, paroxysmal nocturnal dyspnea. Dermatological: No rash, lesions/masses Respiratory: No cough, dyspnea Urologic: No hematuria, dysuria Abdominal:   No nausea, vomiting, diarrhea, bright red blood per rectum, melena, or hematemesis Neurologic:  No visual changes, wkns, changes in mental status.  She denies any of the above symptoms.   All other systems reviewed and are otherwise negative except as noted above.   Physical Exam:    VS:  BP 126/76   Pulse 77   Ht 5' 9"  (1.753 m)   Wt 193 lb (87.5 kg)   SpO2 93%   BMI 28.50 kg/m    General: Well developed, well nourished [place] American female appearing in no acute distress. Head: Normocephalic, atraumatic, sclera non-icteric, no xanthomas, nares are without  discharge.  Neck: No carotid bruits. JVD not elevated.  Lungs: Respirations regular and unlabored, without wheezes or rales.  Heart: Regular rate and rhythm. No S3 or S4.  No murmur, no rubs, or gallops appreciated. Abdomen: Soft, non-tender, non-distended with normoactive bowel sounds. No hepatomegaly. No rebound/guarding. No obvious abdominal masses. Msk:  Strength and tone appear normal for age. No joint deformities or effusions. Extremities: No clubbing or cyanosis. No lower extremity edema.  Distal pedal pulses are 2+ bilaterally. Neuro: Alert and oriented X 3. Moves all extremities spontaneously. No focal deficits noted. Psych:  Responds to questions appropriately with a normal affect. Skin: No rashes or lesions noted  Wt Readings from Last 3 Encounters:  11/26/18 193 lb (87.5 kg)  10/15/18 198 lb (89.8 kg)  10/01/18 195 lb 3.2 oz (88.5 kg)     Studies/Labs Reviewed:   EKG:  EKG is not ordered today.   Recent Labs: 07/27/2018: ALT 26; B Natriuretic Peptide 899.0 07/28/2018: TSH 3.202 07/29/2018: Magnesium 1.9 08/01/2018: Hemoglobin 10.4; Platelets 172 10/15/2018: BUN 28; Creatinine, Ser 1.64; Potassium 4.7; Sodium 140   Lipid Panel    Component Value Date/Time   CHOL 181 07/28/2018 0526   TRIG 68 07/28/2018 0526   HDL 58 07/28/2018 0526   CHOLHDL 3.1 07/28/2018 0526   VLDL 14 07/28/2018 0526   LDLCALC 109 (H) 07/28/2018 0526    Additional studies/ records that were reviewed today include:   Echocardiogram: 07/2018 Study Conclusions  - Left ventricle: The cavity size was normal. Wall thickness was   increased in a pattern of mild LVH. Systolic function was   moderately reduced. The estimated ejection fraction was in the   range of 35% to 40%. Basal inferior and lateral severe   hypokinesis and pseudo-dyskinesis (enhanced diaphragmatic motion)   of the posterior wall. Doppler parameters are consistent with   abnormal left ventricular relaxation (grade 1 diastolic    dysfunction). The E/e&' ratio is >15, suggesting elevated LV   filling pressure.  Ejection fraction (MOD, 2-plane): 37%. - Mitral valve: Moderate MAC with thickened leaflets. There was   mild to moderate regurgitation. - Left atrium: The atrium was normal in size. - Right atrium: The atrium was normal in size. - Tricuspid valve: There was mild regurgitation. - Pulmonary arteries: PA peak pressure: 35 mm Hg (S). - Inferior vena cava: The vessel was normal in size. The   respirophasic diameter changes were in the normal range (>= 50%),   consistent with normal central venous pressure.  Impressions:  - Compared to a prior study in 03/2018, the LVEF is lower at 35-40%   with inferolateral akinesis and pseudo-dyskinesis of the   posterior wall.  Cardiac Catheterization: 07/2018  Left dominant coronary anatomy.  Normal left main, short.  Nonobstructive proximal LAD 30% and somewhat hazy mid to distal LAD relatively focal 50% stenosis.  Dominant circumflex with large widely patent obtuse marginals.  Circumflex PDA is relatively small and could be a site of SACD although no definite diagnostic features  LVEDP is normal.  Ventriculography was not performed.  Myocardial infarction with no obstructive coronary artery disease ([facility])  RECOMMENDATIONS:   Consider dual antiplatelet therapy (aspirin and Plavix)  Beta-blocker therapy if appropriate.  IV heparin has been discontinued and subcutaneous heparin DVT prophylaxis started.  Recommend uninterrupted dual antiplatelet therapy with Aspirin 74m daily and Clopidogrel 740mdaily for a minimum of 6 months (stable ischemic heart disease - Class I recommendation).  Assessment:    1. Chronic combined systolic and diastolic CHF (congestive heart failure) ([facility]  2. Nonischemic cardiomyopathy ([facility]  3. Coronary artery disease involving native coronary artery of native heart without angina pectoris   4. Essential hypertension   5.  Mixed hyperlipidemia   6. CKD (chronic kidney disease) stage 3, GFR 30-59 ml/min (HCC)      Plan:   In order of problems listed above:  1. Chronic Combined Systolic and Diastolic CHF/ NICM - Echocardiogram in 07/2018 showed a reduced EF of 35 to 40%. She denies any recent dyspnea on exertion, orthopnea, PND, or lower extremity edema. Appears euvolemic by examination. - She is unsure if she has continued taking Lasix on a daily basis, therefore we again reviewed today that she should only take this for weight gain or worsening lower extremity edema. Given her elevated creatinine and having likely continued on Lasix, would not further titrate Losartan or Spironolactone at this time until repeat labs are obtained next week (already scheduled with her PCP). Remains on Coreg 3.125 mg twice daily. She is hesitant about being on further medications but is in agreement if needed. Will plan to obtain a repeat limited echocardiogram to reassess her EF given that she is now 4 months out from her event for if this remains reduced, would titrate Coreg to 6.25 mg twice daily  She would need a Nurse Visit for HR/BP check within two weeks following dose adjustment as she does not have a cuff at home.   2. CAD - She had nonobstructive CAD by cardiac catheterization in 07/2018 as outlined above. She denies any recent chest pain or dyspnea on exertion. Remains on DAPT with ASA and Plavix in the setting of an NSTEMI at that time. Would plan to discontinue Plavix after 1 year. Continue beta-blocker and statin therapy.  3. HTN - BP is well controlled at 126/76 during today's visit. Remains on Coreg 3.125 mg twice daily, Losartan 12.5 mg daily, and Spironolactone 12.5 mg daily.  4. HLD - FLP  in 07/2018 showed total cholesterol 181, triglycerides 68, HDL 58, and LDL 109. Due for repeat labs with her PCP next week and will request these records.  - Remains on Atorvastatin 80 mg daily.  5. Stage 3 CKD - Baseline  creatinine 1.4 - 1.6. Elevated to 1.64 when checked in 09/2018.     Medication Adjustments/Labs and Tests Ordered: Current medicines are reviewed at length with the patient today.  Concerns regarding medicines are outlined above.  Medication changes, Labs and Tests ordered today are listed in the Patient Instructions below. Patient Instructions  Medication Instructions:  Your physician recommends that you continue on your current medications as directed. Please refer to the Current Medication list given to you today.  If you need a refill on your cardiac medications before your next appointment, please call your pharmacy.   Lab work: NONE  If you have labs (blood work) drawn today and your tests are completely normal, you will receive your results only by: Marland Kitchen MyChart Message (if you have MyChart) OR . A paper copy in the mail If you have any lab test that is abnormal or we need to change your treatment, we will call you to review the results.  Testing/Procedures: Your physician has requested that you have an echocardiogram. Echocardiography is a painless test that uses sound waves to create images of your heart. It provides your doctor with information about the size and shape of your heart and how well your heart's chambers and valves are working. This procedure takes approximately one hour. There are no restrictions for this procedure.  Follow-Up: At [facility], you and your health needs are our priority.  As part of our continuing mission to provide you with exceptional heart care, we have created designated Provider Care Teams.  These Care Teams include your primary Cardiologist (physician) and Advanced Practice Providers (APPs -  Physician Assistants and Nurse Practitioners) who all work together to provide you with the care you need, when you need it. You will need a follow up appointment in 3-4 months.  Please call our office 2 months in advance to schedule this appointment.  You  may see Rozann Lesches, MD or one of the following Advanced Practice Providers on your designated Care Team:   Bernerd Pho, PA-C [facility]) . Ermalinda Barrios, PA-C ([place])  Any Other Special Instructions Will Be Listed Below (If Applicable). Thank you for choosing [facility]!    Signed, Erma Heritage, PA-C  11/26/2018 3:59 PM    [facility] [address] [place], [place] [address] Phone: [phone] Fax: [phone]

## 2018-11-26 ENCOUNTER — Ambulatory Visit: Payer: Medicare HMO | Admitting: Student

## 2018-11-26 ENCOUNTER — Encounter: Payer: Self-pay | Admitting: Student

## 2018-11-26 VITALS — BP 126/76 | HR 77 | Ht 69.0 in | Wt 193.0 lb

## 2018-11-26 DIAGNOSIS — I2510 Atherosclerotic heart disease of native coronary artery without angina pectoris: Secondary | ICD-10-CM | POA: Diagnosis not present

## 2018-11-26 DIAGNOSIS — E782 Mixed hyperlipidemia: Secondary | ICD-10-CM

## 2018-11-26 DIAGNOSIS — I5042 Chronic combined systolic (congestive) and diastolic (congestive) heart failure: Secondary | ICD-10-CM

## 2018-11-26 DIAGNOSIS — I10 Essential (primary) hypertension: Secondary | ICD-10-CM

## 2018-11-26 DIAGNOSIS — N183 Chronic kidney disease, stage 3 unspecified: Secondary | ICD-10-CM

## 2018-11-26 DIAGNOSIS — I428 Other cardiomyopathies: Secondary | ICD-10-CM

## 2018-11-26 NOTE — Patient Instructions (Signed)
Medication Instructions:  Your physician recommends that you continue on your current medications as directed. Please refer to the Current Medication list given to you today.  If you need a refill on your cardiac medications before your next appointment, please call your pharmacy.   Lab work: NONE  If you have labs (blood work) drawn today and your tests are completely normal, you will receive your results only by: Marland Kitchen MyChart Message (if you have MyChart) OR . A paper copy in the mail If you have any lab test that is abnormal or we need to change your treatment, we will call you to review the results.  Testing/Procedures: Your physician has requested that you have an echocardiogram. Echocardiography is a painless test that uses sound waves to create images of your heart. It provides your doctor with information about the size and shape of your heart and how well your heart's chambers and valves are working. This procedure takes approximately one hour. There are no restrictions for this procedure.    Follow-Up: At [facility], you and your health needs are our priority.  As part of our continuing mission to provide you with exceptional heart care, we have created designated Provider Care Teams.  These Care Teams include your primary Cardiologist (physician) and Advanced Practice Providers (APPs -  Physician Assistants and Nurse Practitioners) who all work together to provide you with the care you need, when you need it. You will need a follow up appointment in 3-4 months.  Please call our office 2 months in advance to schedule this appointment.  You may see Rozann Lesches, MD or one of the following Advanced Practice Providers on your designated Care Team:   Bernerd Pho, PA-C [facility]) . Ermalinda Barrios, PA-C ([place])  Any Other Special Instructions Will Be Listed Below (If Applicable). Thank you for choosing [facility]!

## 2018-12-04 ENCOUNTER — Other Ambulatory Visit: Payer: Self-pay

## 2018-12-04 ENCOUNTER — Ambulatory Visit (HOSPITAL_COMMUNITY)
Admission: RE | Admit: 2018-12-04 | Discharge: 2018-12-04 | Disposition: A | Discharge status: Home / Self Care | Payer: Medicare HMO | Source: Ambulatory Visit | Attending: Student | Admitting: Student

## 2018-12-04 DIAGNOSIS — I428 Other cardiomyopathies: Secondary | ICD-10-CM | POA: Diagnosis not present

## 2018-12-04 NOTE — Progress Notes (Signed)
*  PRELIMINARY RESULTS* Echocardiogram 2D Echocardiogram has been performed.  [name] 12/04/2018, 9:35 AM

## 2018-12-05 ENCOUNTER — Encounter: Payer: Self-pay | Admitting: *Deleted

## 2018-12-06 ENCOUNTER — Other Ambulatory Visit: Payer: Self-pay | Admitting: Cardiology

## 2018-12-11 ENCOUNTER — Telehealth: Payer: Self-pay | Admitting: Cardiology

## 2018-12-11 ENCOUNTER — Telehealth: Payer: Self-pay | Admitting: *Deleted

## 2018-12-11 DIAGNOSIS — Z79899 Other long term (current) drug therapy: Secondary | ICD-10-CM

## 2018-12-11 MED ORDER — SACUBITRIL-VALSARTAN 24-26 MG PO TABS: 1.0000 | ORAL_TABLET | Freq: Two times a day (BID) | ORAL | 11 refills | Status: DC

## 2018-12-11 NOTE — Telephone Encounter (Signed)
-----   Message from Erma Heritage, [place] sent at 12/10/2018  4:45 PM EDT ----- Please let the patient know that I reviewed recent labs from her PCP and renal function had improved as creatinine was 1.54 which is close to her baseline. Would recommend stopping Losartan and transitioning to Entresto 24-26mg  BID (please send in 30-day card - may need patient assistance if co-pay unaffordable). She will need a Nurse Visit for BP check in 2 weeks and BMET two weeks following medication changes. Given the recent Coronavirus outbreak and reduced office visits, she can wait several weeks before switching but make [place] aware once doing so, that way everything can be followed with the switch.

## 2018-12-11 NOTE — Telephone Encounter (Signed)
Spoke with pt who states that she in [place], [place] with her son. She does not know when she will return home. Lab results given. Pt notified lab slip can be mailed to her and medicine called in to a pharmacy near her.

## 2018-12-11 NOTE — Telephone Encounter (Signed)
Please give pt's son Lanny Hurst  a call @ [phone] concerning testing results and what she needs to do.

## 2018-12-11 NOTE — Telephone Encounter (Signed)
Called son. No answer. Left msg to call back.

## 2018-12-12 NOTE — Telephone Encounter (Signed)
Notified pt and son that it is ok to start medication when she returns home.

## 2018-12-12 NOTE — Telephone Encounter (Signed)
Spoke with son. Lab results given. Son voiced understanding.

## 2018-12-27 ENCOUNTER — Other Ambulatory Visit: Payer: Self-pay | Admitting: Family Medicine

## 2018-12-27 DIAGNOSIS — R921 Mammographic calcification found on diagnostic imaging of breast: Secondary | ICD-10-CM

## 2019-01-27 ENCOUNTER — Other Ambulatory Visit: Payer: Self-pay | Admitting: Cardiology

## 2019-03-04 ENCOUNTER — Telehealth: Payer: Self-pay | Admitting: Cardiology

## 2019-03-04 NOTE — Telephone Encounter (Signed)
Virtual Visit Pre-Appointment Phone Call  "(Name), I am calling you today to discuss your upcoming appointment. We are currently trying to limit exposure to the virus that causes COVID-19 by seeing patients at home rather than in the office."  1. "What is the BEST phone number to call the day of the visit?" - include this in appointment notes  2. Do you have or have access to (through a family member/friend) a smartphone with video capability that we can use for your visit?" a. If yes - list this number in appt notes as cell (if different from BEST phone #) and list the appointment type as a VIDEO visit in appointment notes b. If no - list the appointment type as a PHONE visit in appointment notes  3. Confirm consent - "In the setting of the current Covid19 crisis, you are scheduled for a (phone or video) visit with your provider on (date) at (time).  Just as we do with many in-office visits, in order for you to participate in this visit, we must obtain consent.  If you'd like, I can send this to your mychart (if signed up) or email for you to review.  Otherwise, I can obtain your verbal consent now.  All virtual visits are billed to your insurance company just like a normal visit would be.  By agreeing to a virtual visit, we'd like you to understand that the technology does not allow for your provider to perform an examination, and thus may limit your provider's ability to fully assess your condition. If your provider identifies any concerns that need to be evaluated in person, we will make arrangements to do so.  Finally, though the technology is pretty good, we cannot assure that it will always work on either your or our end, and in the setting of a video visit, we may have to convert it to a phone-only visit.  In either situation, we cannot ensure that we have a secure connection.  Are you willing to proceed?" STAFF: Did the patient verbally acknowledge consent to telehealth visit? Document  YES/NO here: Yes  4. Advise patient to be prepared - "Two hours prior to your appointment, go ahead and check your blood pressure, pulse, oxygen saturation, and your weight (if you have the equipment to check those) and write them all down. When your visit starts, your provider will ask you for this information. If you have an Apple Watch or Kardia device, please plan to have heart rate information ready on the day of your appointment. Please have a pen and paper handy nearby the day of the visit as well."  5. Give patient instructions for MyChart download to smartphone OR [email or web address] as below if video visit (depending on what platform provider is using)  6. Inform patient they will receive a phone call 15 minutes prior to their appointment time (may be from unknown caller ID) so they should be prepared to answer    [facility] has been deemed a candidate for a follow-up tele-health visit to limit community exposure during the Covid-19 pandemic. I spoke with the patient via phone to ensure availability of phone/video source, confirm preferred email & phone number, and discuss instructions and expectations.  I reminded [place] to be prepared with any vital sign and/or heart rhythm information that could potentially be obtained via home monitoring, at the time of her visit. I reminded [place] to expect a phone call prior to  her visit.  Terry L Goins 03/04/2019 9:11 AM

## 2019-03-05 ENCOUNTER — Telehealth (INDEPENDENT_AMBULATORY_CARE_PROVIDER_SITE_OTHER): Payer: Medicare HMO | Admitting: Cardiology

## 2019-03-05 ENCOUNTER — Other Ambulatory Visit: Payer: Self-pay

## 2019-03-05 ENCOUNTER — Ambulatory Visit: Payer: Medicare HMO | Admitting: Cardiology

## 2019-03-05 ENCOUNTER — Encounter: Payer: Self-pay | Admitting: Cardiology

## 2019-03-05 DIAGNOSIS — Z7189 Other specified counseling: Secondary | ICD-10-CM

## 2019-03-05 DIAGNOSIS — I428 Other cardiomyopathies: Secondary | ICD-10-CM

## 2019-03-05 DIAGNOSIS — I2510 Atherosclerotic heart disease of native coronary artery without angina pectoris: Secondary | ICD-10-CM

## 2019-03-05 DIAGNOSIS — E782 Mixed hyperlipidemia: Secondary | ICD-10-CM | POA: Diagnosis not present

## 2019-03-05 NOTE — Progress Notes (Signed)
Virtual Visit via Telephone Note   This visit type was conducted due to national recommendations for restrictions regarding the COVID-19 Pandemic (e.g. social distancing) in an effort to limit this patient's exposure and mitigate transmission in our community.  Due to her co-morbid illnesses, this patient is at least at moderate risk for complications without adequate follow up.  This format is felt to be most appropriate for this patient at this time.  The patient did not have access to video technology/had technical difficulties with video requiring transitioning to audio format only (telephone).  All issues noted in this document were discussed and addressed.  No physical exam could be performed with this format.  Please refer to the patient's chart for her  consent to telehealth for [facility].   Date:  03/05/2019   ID:  [name], DOB 1930-05-18, MRN [id number]  Patient Location: Other:  Son's house in [place] Provider Location: Office  PCP:  Lemmie Evens, MD  Cardiologist:  Rozann Lesches, MD Electrophysiologist:  None   Evaluation Performed:  Follow-Up Visit  Chief Complaint:   Cardiac follow-up  History of Present Illness:    [name] is an 85 y.o. female last seen in March by Ms. Strader PA-C.  She did not have video access and we spoke by phone today.  She is living with her son in [place], has been there since February.  She tells me that she has been doing relatively well, she walks outside for about 2 miles most days of the week.  She goes at a slow pace but does not report any progressive shortness of breath or angina symptoms.  She has had no palpitations or syncope.  I reviewed her echocardiogram report and lab work from March as outlined below.  We also went over her medications.  She reports compliance.  Her weight is up 3 or 4 pounds, she does not report any interval use of Lasix, orthopnea, or leg swelling.  The patient does not have symptoms  concerning for COVID-19 infection (fever, chills, cough, or new shortness of breath).  Patient son takes care of all of her food needs, she does wear a mask if she goes outdoors.   Past Medical History:  Diagnosis Date  . Asthma   . Breast cancer, right breast ([place])   . CKD (chronic kidney disease), stage III ([place])   . Coronary atherosclerosis    a. cardiac catheterization in 2004 with 60% mid LAD b. nonobstructive disease by cath in 07/2018 at time of NSTEMI  . DVT (deep venous thrombosis) ([place])   . Essential hypertension   . History of breast cancer 1987   Right-sided, mastectomy with axillary node dissection and Adriamycin   . History of thyroid cancer   . Hodgkin lymphoma ([place]) 04/03/2017  . Hodgkin's disease ([place])   . Hypothyroidism   . Nonischemic cardiomyopathy ([place])    a. EF 35-40% by echo in 07/2018  . NSTEMI (non-ST elevated myocardial infarction) ([place]) 07/27/2018  . Obesity   . Osteoporosis   . Peripheral arterial disease ([place])    Stent to the left common iliac in 2006  . Thyroid carcinoma ([place]) 04/03/2017   Past Surgical History:  Procedure Laterality Date  . ABDOMINAL HYSTERECTOMY    . ANGIOPLASTY / STENTING FEMORAL Left 07/13/2005  . APPENDECTOMY    . BUNIONECTOMY Right   . CATARACT EXTRACTION W/PHACO  05/28/2012   Procedure: CATARACT EXTRACTION PHACO AND INTRAOCULAR LENS PLACEMENT (IOC);  Surgeon: Elta Guadeloupe T. Gershon Crane,  MD;  Location: [facility];  Service: Ophthalmology;  Laterality: Right;  CDE 12.86  . CATARACT EXTRACTION W/PHACO  06/04/2012   Procedure: CATARACT EXTRACTION PHACO AND INTRAOCULAR LENS PLACEMENT (IOC);  Surgeon: Elta Guadeloupe T. Gershon Crane, MD;  Location: [facility];  Service: Ophthalmology;  Laterality: Left;  CDE:15.32  . COLONOSCOPY    . LEFT HEART CATH AND CORONARY ANGIOGRAPHY N/A 07/30/2018   Procedure: LEFT HEART CATH AND CORONARY ANGIOGRAPHY;  Surgeon: Belva Crome, MD;  Location: [facility];  Service: Cardiovascular;  Laterality: N/A;  . MASTECTOMY PARTIAL /  LUMPECTOMY W/ AXILLARY LYMPHADENECTOMY Right 1987  . THYROIDECTOMY    . TONSILLECTOMY    . YAG LASER APPLICATION Right 31/51/7616   Procedure: YAG LASER APPLICATION;  Surgeon: Rutherford Guys, MD;  Location: [facility];  Service: Ophthalmology;  Laterality: Right;  . YAG LASER APPLICATION Left 0/03/3709   Procedure: YAG LASER APPLICATION;  Surgeon: Rutherford Guys, MD;  Location: [facility];  Service: Ophthalmology;  Laterality: Left;     Current Meds  Medication Sig  . aspirin 81 MG tablet Take 81 mg by mouth daily.  . calcium-vitamin D (OSCAL WITH D) 500-200 MG-UNIT tablet Take 1 tablet by mouth 3 (three) times daily.  . carvedilol (COREG) 3.125 MG tablet Take 1 tablet (3.125 mg total) by mouth 2 (two) times daily.  . clopidogrel (PLAVIX) 75 MG tablet Take 1 tablet (75 mg total) by mouth daily.  Marland Kitchen FLOVENT HFA 44 MCG/ACT inhaler Inhale 2 puffs into the lungs 2 (two) times daily.  Marland Kitchen levothyroxine (SYNTHROID, LEVOTHROID) 100 MCG tablet Take 100 mcg by mouth daily.  . Multiple Vitamins-Calcium (ONE-A-DAY WOMENS PO) Take 1 tablet by mouth daily.  . sacubitril-valsartan (ENTRESTO) 24-26 MG Take 1 tablet by mouth 2 (two) times daily.  . temazepam (RESTORIL) 30 MG capsule Take 30 mg by mouth at bedtime.   . vitamin C (ASCORBIC ACID) 500 MG tablet Take 500 mg by mouth daily.  . [DISCONTINUED] atorvastatin (LIPITOR) 80 MG tablet TAKE 1 TABLET BY MOUTH ONCE DAILY AT  6PM  . [DISCONTINUED] furosemide (LASIX) 20 MG tablet Take only for leg swelling or 2 lbs weight gain overnite     Allergies:   Patient has no known allergies.   Social History   Tobacco Use  . Smoking status: Never Smoker  . Smokeless tobacco: Never Used  Substance Use Topics  . Alcohol use: No  . Drug use: No     Family Hx: The patient's family history includes Heart attack in her father; Lung cancer in her mother.  ROS:   Please see the history of present illness.    Hearing loss. All other systems reviewed and are  negative.   Prior CV studies:   The following studies were reviewed today:  Echocardiogram 12/04/2018:  1. The left ventricle has severely reduced systolic function, with an ejection fraction of 20-25%. The cavity size was mildly dilated. There is mild concentric left ventricular hypertrophy. Left ventricular diastolic Doppler parameters are consistent  with impaired relaxation. Elevated left ventricular end-diastolic pressure Left ventricular diffuse hypokinesis.  2. The right ventricle has moderately reduced systolic function. The cavity was normal. There is no increase in right ventricular wall thickness.  3. Left atrial size was severely dilated.  4. Right atrial size was mildly dilated.  5. Mild thickening of the mitral valve leaflet. There is mild mitral annular calcification present. Mitral valve regurgitation is moderate by color flow Doppler.  6. Mild thickening of the aortic valve Mild  calcification of the aortic valve.  7. The aortic root is normal in size and structure.  8. The inferior vena cava was dilated in size with >50% respiratory variability.  Cardiac catheterization 07/30/2018:  Left dominant coronary anatomy.  Normal left main, short.  Nonobstructive proximal LAD 30% and somewhat hazy mid to distal LAD relatively focal 50% stenosis.  Dominant circumflex with large widely patent obtuse marginals.  Circumflex PDA is relatively small and could be a site of SACD although no definite diagnostic features  LVEDP is normal.  Ventriculography was not performed.  Myocardial infarction with no obstructive coronary artery disease ([place])  RECOMMENDATIONS:   Consider dual antiplatelet therapy (aspirin and Plavix)  Beta-blocker therapy if appropriate.  IV heparin has been discontinued and subcutaneous heparin DVT prophylaxis started.  Labs/Other Tests and Data Reviewed:    EKG:  An ECG dated 07/27/2018 was personally reviewed today and demonstrated:  Sinus rhythm  with left anterior fascicular block.  Recent Labs: 07/27/2018: ALT 26; B Natriuretic Peptide 899.0 07/28/2018: TSH 3.202 07/29/2018: Magnesium 1.9 08/01/2018: Hemoglobin 10.4; Platelets 172 10/15/2018: BUN 28; Creatinine, Ser 1.64; Potassium 4.7; Sodium 140  March 2020: Cholesterol 154, HDL 66, triglycerides 60, LDL 74, BUN 30, creatinine 1.54, potassium 4.5, AST 19, ALT 15, TSH 6.70, hemoglobin 11.8, platelets 249  Recent Lipid Panel Lab Results  Component Value Date/Time   CHOL 181 07/28/2018 05:26 AM   TRIG 68 07/28/2018 05:26 AM   HDL 58 07/28/2018 05:26 AM   CHOLHDL 3.1 07/28/2018 05:26 AM   LDLCALC 109 (H) 07/28/2018 05:26 AM    Wt Readings from Last 3 Encounters:  03/05/19 197 lb (89.4 kg)  11/26/18 193 lb (87.5 kg)  10/15/18 198 lb (89.8 kg)     Objective:    Vital Signs:  Ht 5\' 8"  (1.727 m)   Wt 197 lb (89.4 kg)   BMI 29.95 kg/m    Patient spoke in full sentences, not short of breath. No audible wheezing or coughing. Speech pattern normal.  ASSESSMENT & PLAN:    1.  Moderate, nonobstructive CAD by cardiac catheterization in November 2019.  She was managed medically for NSTEMI at that time and at this point continues on dual antiplatelet therapy.  No active angina symptoms.  2.  Nonischemic cardiomyopathy, LVEF was down to the range of 20 to 25% as of March.  Medical therapy has been modified and now includes Entresto, Coreg, and as needed Lasix.  She is not on Aldactone with CKD stage III.  3.  CKD stage III, follow-up creatinine 1.54 in March.  4.  Mixed hyperlipidemia, has been on Lipitor, her follow-up LDL was 74 in March.  COVID-19 Education: The signs and symptoms of COVID-19 were discussed with the patient and how to seek care for testing (follow up with PCP or arrange E-visit).  The importance of social distancing was discussed today.  Time:   Today, I have spent 10 minutes with the patient with telehealth technology discussing the above problems.      Medication Adjustments/Labs and Tests Ordered: Current medicines are reviewed at length with the patient today.  Concerns regarding medicines are outlined above.   Tests Ordered: Orders Placed This Encounter  Procedures  . Basic Metabolic Panel (BMET)    Medication Changes: No orders of the defined types were placed in this encounter.   Disposition:  Follow up 3 months in the [place] office.  Signed, Rozann Lesches, MD  03/05/2019 1:40 PM    [place]

## 2019-03-05 NOTE — Patient Instructions (Signed)
Medication Instructions:  Your physician recommends that you continue on your current medications as directed. Please refer to the Current Medication list given to you today.   Labwork: 3 MONTHS, JUST BEFORE NEXT VISIT  BMET   Testing/Procedures: NONE  Follow-Up: Your physician recommends that you schedule a follow-up appointment in: 3 MONTHS   Any Other Special Instructions Will Be Listed Below (If Applicable).     If you need a refill on your cardiac medications before your next appointment, please call your pharmacy.

## 2019-04-09 ENCOUNTER — Ambulatory Visit (HOSPITAL_COMMUNITY): Payer: Medicare HMO | Admitting: Adult Health

## 2019-04-10 ENCOUNTER — Ambulatory Visit (HOSPITAL_COMMUNITY): Payer: Medicare HMO | Admitting: Hematology

## 2019-05-04 ENCOUNTER — Other Ambulatory Visit: Payer: Self-pay | Admitting: Cardiology

## 2019-05-06 MED ORDER — ATORVASTATIN CALCIUM 80 MG PO TABS: 80.0000 mg | ORAL_TABLET | Freq: Every day | ORAL | 3 refills | Status: DC

## 2019-05-06 NOTE — Telephone Encounter (Signed)
Please send in refill to [facility]

## 2019-06-12 ENCOUNTER — Ambulatory Visit (INDEPENDENT_AMBULATORY_CARE_PROVIDER_SITE_OTHER): Payer: Medicare HMO | Admitting: Cardiology

## 2019-06-12 ENCOUNTER — Encounter: Payer: Self-pay | Admitting: Cardiology

## 2019-06-12 ENCOUNTER — Other Ambulatory Visit: Payer: Self-pay

## 2019-06-12 VITALS — BP 119/68 | HR 76 | Temp 97.3°F | Ht 69.0 in | Wt 192.0 lb

## 2019-06-12 DIAGNOSIS — N183 Chronic kidney disease, stage 3 unspecified: Secondary | ICD-10-CM

## 2019-06-12 DIAGNOSIS — I2510 Atherosclerotic heart disease of native coronary artery without angina pectoris: Secondary | ICD-10-CM | POA: Diagnosis not present

## 2019-06-12 DIAGNOSIS — E782 Mixed hyperlipidemia: Secondary | ICD-10-CM | POA: Diagnosis not present

## 2019-06-12 DIAGNOSIS — I428 Other cardiomyopathies: Secondary | ICD-10-CM

## 2019-06-12 MED ORDER — CARVEDILOL 3.125 MG PO TABS: 3.1250 mg | ORAL_TABLET | Freq: Two times a day (BID) | ORAL | 3 refills | Status: DC

## 2019-06-12 NOTE — Progress Notes (Signed)
Cardiology Office Note  Date: 06/12/2019   ID: [name], DOB [birth year], MRN [id number]  PCP:  Lemmie Evens, MD  Cardiologist:  Rozann Lesches, MD Electrophysiologist:  None   Chief Complaint  Patient presents with  . Cardiac follow-up    History of Present Illness: [name] is an 85 y.o. female last assessed via telehealth encounter in June.  She has been back home from staying with family in [place] for about a month.  Remains functional with ADLs.  She does not report any active angina at this time and overall has NYHA class II dyspnea with typical activities.  She does not report any palpitations or syncope.  We went over her medications today.  Current cardiac regimen includes Coreg and Entresto, she is not on standing diuretics at this point.  She also remains on aspirin and Plavix along with Lipitor.  We discussed getting follow-up lab work.  Echocardiogram from March as outlined below, LVEF 20 to 25% at that time.  Past Medical History:  Diagnosis Date  . Asthma   . Breast cancer, right breast ([place])   . CKD (chronic kidney disease), stage III ([place])   . Coronary atherosclerosis    a. cardiac catheterization in 2004 with 60% mid LAD b. nonobstructive disease by cath in 07/2018 at time of NSTEMI  . DVT (deep venous thrombosis) ([place])   . Essential hypertension   . History of breast cancer 1987   Right-sided, mastectomy with axillary node dissection and Adriamycin   . History of thyroid cancer   . Hodgkin lymphoma ([place]) 04/03/2017  . Hodgkin's disease ([place])   . Hypothyroidism   . Nonischemic cardiomyopathy ([place])    a. EF 35-40% by echo in 07/2018  . NSTEMI (non-ST elevated myocardial infarction) ([place]) 07/27/2018  . Obesity   . Osteoporosis   . Peripheral arterial disease ([place])    Stent to the left common iliac in 2006  . Thyroid carcinoma ([place]) 04/03/2017    Past Surgical History:  Procedure Laterality Date  . ABDOMINAL HYSTERECTOMY    .  ANGIOPLASTY / STENTING FEMORAL Left 07/13/2005  . APPENDECTOMY    . BUNIONECTOMY Right   . CATARACT EXTRACTION W/PHACO  05/28/2012   Procedure: CATARACT EXTRACTION PHACO AND INTRAOCULAR LENS PLACEMENT (IOC);  Surgeon: Elta Guadeloupe T. Gershon Crane, MD;  Location: [facility];  Service: Ophthalmology;  Laterality: Right;  CDE 12.86  . CATARACT EXTRACTION W/PHACO  06/04/2012   Procedure: CATARACT EXTRACTION PHACO AND INTRAOCULAR LENS PLACEMENT (IOC);  Surgeon: Elta Guadeloupe T. Gershon Crane, MD;  Location: [facility];  Service: Ophthalmology;  Laterality: Left;  CDE:15.32  . COLONOSCOPY    . LEFT HEART CATH AND CORONARY ANGIOGRAPHY N/A 07/30/2018   Procedure: LEFT HEART CATH AND CORONARY ANGIOGRAPHY;  Surgeon: Belva Crome, MD;  Location: [facility];  Service: Cardiovascular;  Laterality: N/A;  . MASTECTOMY PARTIAL / LUMPECTOMY W/ AXILLARY LYMPHADENECTOMY Right 1987  . THYROIDECTOMY    . TONSILLECTOMY    . YAG LASER APPLICATION Right 14/97/0263   Procedure: YAG LASER APPLICATION;  Surgeon: Rutherford Guys, MD;  Location: [facility];  Service: Ophthalmology;  Laterality: Right;  . YAG LASER APPLICATION Left 04/01/5884   Procedure: YAG LASER APPLICATION;  Surgeon: Rutherford Guys, MD;  Location: [facility];  Service: Ophthalmology;  Laterality: Left;    Current Outpatient Medications  Medication Sig Dispense Refill  . aspirin 81 MG tablet Take 81 mg by mouth daily.    Marland Kitchen atorvastatin (LIPITOR) 80 MG tablet Take 1  tablet (80 mg total) by mouth daily. 90 tablet 3  . calcium-vitamin D (OSCAL WITH D) 500-200 MG-UNIT tablet Take 1 tablet by mouth 3 (three) times daily. 90 tablet 1  . clopidogrel (PLAVIX) 75 MG tablet Take 1 tablet (75 mg total) by mouth daily. 90 tablet 2  . FLOVENT HFA 44 MCG/ACT inhaler Inhale 2 puffs into the lungs 2 (two) times daily.  11  . levothyroxine (SYNTHROID, LEVOTHROID) 100 MCG tablet Take 100 mcg by mouth daily.    . Multiple Vitamins-Calcium (ONE-A-DAY WOMENS PO) Take 1 tablet by mouth daily.    . nitroGLYCERIN  (NITROSTAT) 0.4 MG SL tablet Place 1 tablet (0.4 mg total) under the tongue every 5 (five) minutes x 3 doses as needed for chest pain. 25 tablet 1  . sacubitril-valsartan (ENTRESTO) 24-26 MG Take 1 tablet by mouth 2 (two) times daily. 60 tablet 11  . vitamin C (ASCORBIC ACID) 500 MG tablet Take 500 mg by mouth daily.    . carvedilol (COREG) 3.125 MG tablet Take 1 tablet (3.125 mg total) by mouth 2 (two) times daily with a meal. 180 tablet 3   No current facility-administered medications for this visit.    Allergies:  Patient has no known allergies.   Social History: The patient  reports that she has never smoked. She has never used smokeless tobacco. She reports that she does not drink alcohol or use drugs.   ROS:  Please see the history of present illness. Otherwise, complete review of systems is positive for arthritic pain.  All other systems are reviewed and negative.   Physical Exam: VS:  BP 119/68   Pulse 76   Temp (!) 97.3 F (36.3 C)   Ht 5\' 9"  (1.753 m)   Wt 192 lb (87.1 kg)   BMI 28.35 kg/m , BMI Body mass index is 28.35 kg/m.  Wt Readings from Last 3 Encounters:  06/12/19 192 lb (87.1 kg)  03/05/19 197 lb (89.4 kg)  11/26/18 193 lb (87.5 kg)    General: Elderly woman, appears comfortable at rest. HEENT: Conjunctiva and lids normal, wearing a mask. Neck: Supple, no elevated JVP or carotid bruits, no thyromegaly. Lungs: Clear to auscultation, nonlabored breathing at rest. Cardiac: Indistinct PMI, RRR, no S3 or significant systolic murmur. Abdomen: Soft, nontender, bowel sounds present. Extremities: No pitting edema, distal pulses 2+. Skin: Warm and dry. Musculoskeletal: No kyphosis. Neuropsychiatric: Alert and oriented x3, affect grossly appropriate.  ECG:  An ECG dated 07/27/2018 was personally reviewed today and demonstrated:  Sinus rhythm with left atrial enlargement and leftward axis.  Recent Labwork: 07/27/2018: ALT 26; AST 35; B Natriuretic Peptide  899.0 07/28/2018: TSH 3.202 07/29/2018: Magnesium 1.9 08/01/2018: Hemoglobin 10.4; Platelets 172 10/15/2018: BUN 28; Creatinine, Ser 1.64; Potassium 4.7; Sodium 140     Component Value Date/Time   CHOL 181 07/28/2018 0526   TRIG 68 07/28/2018 0526   HDL 58 07/28/2018 0526   CHOLHDL 3.1 07/28/2018 0526   VLDL 14 07/28/2018 0526   LDLCALC 109 (H) 07/28/2018 0526    Other Studies Reviewed Today:  Echocardiogram 12/04/2018:  1. The left ventricle has severely reduced systolic function, with an ejection fraction of 20-25%. The cavity size was mildly dilated. There is mild concentric left ventricular hypertrophy. Left ventricular diastolic Doppler parameters are consistent  with impaired relaxation. Elevated left ventricular end-diastolic pressure Left ventricular diffuse hypokinesis.  2. The right ventricle has moderately reduced systolic function. The cavity was normal. There is no increase in right ventricular wall  thickness.  3. Left atrial size was severely dilated.  4. Right atrial size was mildly dilated.  5. Mild thickening of the mitral valve leaflet. There is mild mitral annular calcification present. Mitral valve regurgitation is moderate by color flow Doppler.  6. Mild thickening of the aortic valve Mild calcification of the aortic valve.  7. The aortic root is normal in size and structure.  8. The inferior vena cava was dilated in size with >50% respiratory variability.  Assessment and Plan:  1.  Nonischemic cardiomyopathy with LVEF 20 to 25% as of March.  She is on Coreg and Entresto, has not required standing diuretic therapy and we have not pursued Aldactone given renal insufficiency.  Plan is to obtain follow-up BMET now and a repeat echocardiogram with her next visit in 3 months.  2.  CKD stage III, last creatinine 1.64 with normal potassium.  Follow-up BMET.  3.  Mixed hyperlipidemia, on Lipitor.  Check FLP and LFTs.  4.  Moderate, nonobstructive CAD without active angina  symptoms.  She remains on dual antiplatelet therapy and statin.  Medication Adjustments/Labs and Tests Ordered: Current medicines are reviewed at length with the patient today.  Concerns regarding medicines are outlined above.   Tests Ordered: Orders Placed This Encounter  Procedures  . Basic Metabolic Panel (BMET)  . Lipid Profile  . Hepatic function panel  . ECHOCARDIOGRAM COMPLETE    Medication Changes: Meds ordered this encounter  Medications  . carvedilol (COREG) 3.125 MG tablet    Sig: Take 1 tablet (3.125 mg total) by mouth 2 (two) times daily with a meal.    Dispense:  180 tablet    Refill:  3    Disposition:  Follow up 3 months in the [place] office.  Signed, Satira Sark, MD, [facility] 06/12/2019 12:03 PM    [place] [facility] [address], [place], [place] [address] Phone: [phone]; Fax: [phone]

## 2019-06-12 NOTE — Patient Instructions (Signed)
Medication Instructions: I called in a refill of your Coreg.Take 3.125 mg twice a day  Labwork: Fasting lipids and liver function test and bmet  Procedures/Testing: Your physician has requested that you have an echocardiogram in 3 months. Echocardiography is a painless test that uses sound waves to create images of your heart. It provides your doctor with information about the size and shape of your heart and how well your heart's chambers and valves are working. This procedure takes approximately one hour. There are no restrictions for this procedure.     Follow-Up:3 months with Dr.McDowell  Any Additional Special Instructions Will Be Listed Below (If Applicable).     If you need a refill on your cardiac medications before your next appointment, please call your pharmacy.

## 2019-06-13 ENCOUNTER — Other Ambulatory Visit (HOSPITAL_COMMUNITY)
Admission: RE | Admit: 2019-06-13 | Discharge: 2019-06-13 | Disposition: A | Discharge status: Home / Self Care | Payer: Medicare HMO | Source: Ambulatory Visit | Attending: Cardiology | Admitting: Cardiology

## 2019-06-13 DIAGNOSIS — I2510 Atherosclerotic heart disease of native coronary artery without angina pectoris: Secondary | ICD-10-CM | POA: Insufficient documentation

## 2019-06-13 DIAGNOSIS — I428 Other cardiomyopathies: Secondary | ICD-10-CM | POA: Diagnosis present

## 2019-06-13 LAB — BASIC METABOLIC PANEL
Anion gap: 7 (ref 5–15)
BUN: 28 mg/dL — ABNORMAL HIGH (ref 8–23)
CO2: 26 mmol/L (ref 22–32)
Calcium: 7.3 mg/dL — ABNORMAL LOW (ref 8.9–10.3)
Chloride: 109 mmol/L (ref 98–111)
Creatinine, Ser: 1.33 mg/dL — ABNORMAL HIGH (ref 0.44–1.00)
GFR calc Af Amer: 42 mL/min — ABNORMAL LOW (ref >60)
GFR calc non Af Amer: 36 mL/min — ABNORMAL LOW (ref >60)
Glucose, Bld: 100 mg/dL — ABNORMAL HIGH (ref 70–99)
Potassium: 4.3 mmol/L (ref 3.5–5.1)
Sodium: 142 mmol/L (ref 135–145)

## 2019-06-13 LAB — LIPID PANEL
Cholesterol: 184 mg/dL (ref 0–200)
HDL: 71 mg/dL (ref >40)
LDL Cholesterol: 103 mg/dL — ABNORMAL HIGH (ref 0–99)
Total CHOL/HDL Ratio: 2.6 RATIO
Triglycerides: 49 mg/dL (ref <150)
VLDL: 10 mg/dL (ref 0–40)

## 2019-06-25 ENCOUNTER — Telehealth: Payer: Self-pay | Admitting: Cardiology

## 2019-06-25 NOTE — Telephone Encounter (Signed)
We do not have any samples in the [place].We will try to obtain from [facility] office

## 2019-06-26 NOTE — Telephone Encounter (Signed)
Pt made aware that samples are waiting at front office. She was very grateful for help during this time.

## 2019-07-21 ENCOUNTER — Telehealth: Payer: Self-pay | Admitting: Cardiology

## 2019-07-21 NOTE — Telephone Encounter (Signed)
Patient is going to have echo in November 11/19 at 11:30 am

## 2019-07-21 NOTE — Telephone Encounter (Signed)
Pt has order in to have Echo done in Dec, she goes out of town to [place] from 08/16/2019 to the first of next year. Would like to know if she should do it sooner or wait till next year?

## 2019-08-12 ENCOUNTER — Telehealth: Payer: Self-pay | Admitting: Cardiology

## 2019-08-12 NOTE — Telephone Encounter (Signed)
Pt is needing some more samples of Entresto and she'd like to see if she would be approved for assistance

## 2019-08-13 NOTE — Telephone Encounter (Signed)
Returned pt call. NA. Unable to leave msg.

## 2019-08-14 ENCOUNTER — Other Ambulatory Visit: Payer: Self-pay

## 2019-08-14 ENCOUNTER — Ambulatory Visit (HOSPITAL_COMMUNITY)
Admission: RE | Admit: 2019-08-14 | Discharge: 2019-08-14 | Disposition: A | Discharge status: Home / Self Care | Payer: Medicare HMO | Source: Ambulatory Visit | Attending: Cardiology | Admitting: Cardiology

## 2019-08-14 DIAGNOSIS — I428 Other cardiomyopathies: Secondary | ICD-10-CM | POA: Insufficient documentation

## 2019-08-14 NOTE — Progress Notes (Signed)
*  PRELIMINARY RESULTS* Echocardiogram 2D Echocardiogram has been performed.  Samuel Germany 08/14/2019, 12:14 PM

## 2019-08-18 ENCOUNTER — Other Ambulatory Visit (HOSPITAL_COMMUNITY): Payer: Medicare HMO

## 2019-08-20 NOTE — Telephone Encounter (Signed)
Mailed pt letter. Asking her to come in office to fill out assistance paper work.

## 2019-09-09 ENCOUNTER — Other Ambulatory Visit: Payer: Self-pay | Admitting: Cardiology

## 2019-09-29 ENCOUNTER — Other Ambulatory Visit: Payer: Self-pay | Admitting: Cardiology

## 2019-10-01 ENCOUNTER — Telehealth: Payer: Self-pay | Admitting: Cardiology

## 2019-10-01 NOTE — Telephone Encounter (Signed)
Calling about pt assistance - please give pt a call

## 2019-10-06 ENCOUNTER — Other Ambulatory Visit (HOSPITAL_COMMUNITY): Payer: Self-pay | Admitting: *Deleted

## 2019-10-06 DIAGNOSIS — C8100 Nodular lymphocyte predominant Hodgkin lymphoma, unspecified site: Secondary | ICD-10-CM

## 2019-10-06 DIAGNOSIS — Z853 Personal history of malignant neoplasm of breast: Secondary | ICD-10-CM

## 2019-10-06 NOTE — Telephone Encounter (Signed)
Returned pt call no answer, voice mail no set up.

## 2019-10-07 ENCOUNTER — Inpatient Hospital Stay (HOSPITAL_COMMUNITY): Payer: Medicare HMO | Attending: Hematology

## 2019-10-07 DIAGNOSIS — Z79899 Other long term (current) drug therapy: Secondary | ICD-10-CM | POA: Insufficient documentation

## 2019-10-07 DIAGNOSIS — I252 Old myocardial infarction: Secondary | ICD-10-CM | POA: Insufficient documentation

## 2019-10-07 DIAGNOSIS — Z85850 Personal history of malignant neoplasm of thyroid: Secondary | ICD-10-CM | POA: Insufficient documentation

## 2019-10-07 DIAGNOSIS — Z90710 Acquired absence of both cervix and uterus: Secondary | ICD-10-CM | POA: Insufficient documentation

## 2019-10-07 DIAGNOSIS — R0602 Shortness of breath: Secondary | ICD-10-CM | POA: Insufficient documentation

## 2019-10-07 DIAGNOSIS — Z8572 Personal history of non-Hodgkin lymphomas: Secondary | ICD-10-CM | POA: Insufficient documentation

## 2019-10-07 DIAGNOSIS — Z7982 Long term (current) use of aspirin: Secondary | ICD-10-CM | POA: Insufficient documentation

## 2019-10-07 DIAGNOSIS — Z9011 Acquired absence of right breast and nipple: Secondary | ICD-10-CM | POA: Insufficient documentation

## 2019-10-07 DIAGNOSIS — Z86718 Personal history of other venous thrombosis and embolism: Secondary | ICD-10-CM | POA: Insufficient documentation

## 2019-10-07 DIAGNOSIS — G479 Sleep disorder, unspecified: Secondary | ICD-10-CM | POA: Insufficient documentation

## 2019-10-07 DIAGNOSIS — N183 Chronic kidney disease, stage 3 unspecified: Secondary | ICD-10-CM | POA: Insufficient documentation

## 2019-10-07 DIAGNOSIS — Z801 Family history of malignant neoplasm of trachea, bronchus and lung: Secondary | ICD-10-CM | POA: Insufficient documentation

## 2019-10-07 DIAGNOSIS — Z853 Personal history of malignant neoplasm of breast: Secondary | ICD-10-CM | POA: Insufficient documentation

## 2019-10-07 DIAGNOSIS — Z8249 Family history of ischemic heart disease and other diseases of the circulatory system: Secondary | ICD-10-CM | POA: Insufficient documentation

## 2019-10-09 NOTE — Telephone Encounter (Signed)
Spoke with pt who is requesting assistance with Entresto. Pt will come by office today to complete paperwork.

## 2019-10-14 ENCOUNTER — Other Ambulatory Visit: Payer: Self-pay

## 2019-10-14 ENCOUNTER — Inpatient Hospital Stay (HOSPITAL_COMMUNITY): Payer: Medicare HMO | Attending: Hematology

## 2019-10-14 ENCOUNTER — Ambulatory Visit (HOSPITAL_COMMUNITY): Payer: Medicare HMO | Admitting: Hematology

## 2019-10-14 DIAGNOSIS — Z85850 Personal history of malignant neoplasm of thyroid: Secondary | ICD-10-CM | POA: Diagnosis not present

## 2019-10-14 DIAGNOSIS — Z853 Personal history of malignant neoplasm of breast: Secondary | ICD-10-CM

## 2019-10-14 DIAGNOSIS — Z923 Personal history of irradiation: Secondary | ICD-10-CM | POA: Diagnosis not present

## 2019-10-14 DIAGNOSIS — C8100 Nodular lymphocyte predominant Hodgkin lymphoma, unspecified site: Secondary | ICD-10-CM

## 2019-10-14 DIAGNOSIS — Z9221 Personal history of antineoplastic chemotherapy: Secondary | ICD-10-CM | POA: Diagnosis not present

## 2019-10-14 DIAGNOSIS — Z8572 Personal history of non-Hodgkin lymphomas: Secondary | ICD-10-CM | POA: Diagnosis not present

## 2019-10-14 LAB — CBC WITH DIFFERENTIAL/PLATELET
Abs Immature Granulocytes: 0.02 10*3/uL (ref 0.00–0.07)
Basophils Absolute: 0.1 10*3/uL (ref 0.0–0.1)
Basophils Relative: 1 %
Eosinophils Absolute: 0.1 10*3/uL (ref 0.0–0.5)
Eosinophils Relative: 1 %
HCT: 38.7 % (ref 36.0–46.0)
Hemoglobin: 12 g/dL (ref 12.0–15.0)
Immature Granulocytes: 0 %
Lymphocytes Relative: 27 %
Lymphs Abs: 1.7 10*3/uL (ref 0.7–4.0)
MCH: 28 pg (ref 26.0–34.0)
MCHC: 31 g/dL (ref 30.0–36.0)
MCV: 90.4 fL (ref 80.0–100.0)
Monocytes Absolute: 0.7 10*3/uL (ref 0.1–1.0)
Monocytes Relative: 10 %
Neutro Abs: 3.8 10*3/uL (ref 1.7–7.7)
Neutrophils Relative %: 61 %
Platelets: 237 10*3/uL (ref 150–400)
RBC: 4.28 MIL/uL (ref 3.87–5.11)
RDW: 15.9 % — ABNORMAL HIGH (ref 11.5–15.5)
WBC: 6.4 10*3/uL (ref 4.0–10.5)
nRBC: 0 % (ref 0.0–0.2)

## 2019-10-14 LAB — COMPREHENSIVE METABOLIC PANEL
ALT: 20 U/L (ref 0–44)
AST: 24 U/L (ref 15–41)
Albumin: 3.8 g/dL (ref 3.5–5.0)
Alkaline Phosphatase: 63 U/L (ref 38–126)
Anion gap: 12 (ref 5–15)
BUN: 32 mg/dL — ABNORMAL HIGH (ref 8–23)
CO2: 26 mmol/L (ref 22–32)
Calcium: 7.3 mg/dL — ABNORMAL LOW (ref 8.9–10.3)
Chloride: 104 mmol/L (ref 98–111)
Creatinine, Ser: 1.55 mg/dL — ABNORMAL HIGH (ref 0.44–1.00)
GFR calc Af Amer: 35 mL/min — ABNORMAL LOW (ref >60)
GFR calc non Af Amer: 30 mL/min — ABNORMAL LOW (ref >60)
Glucose, Bld: 101 mg/dL — ABNORMAL HIGH (ref 70–99)
Potassium: 4.7 mmol/L (ref 3.5–5.1)
Sodium: 142 mmol/L (ref 135–145)
Total Bilirubin: 0.9 mg/dL (ref 0.3–1.2)
Total Protein: 7.6 g/dL (ref 6.5–8.1)

## 2019-10-21 ENCOUNTER — Encounter (HOSPITAL_COMMUNITY): Payer: Self-pay | Admitting: Hematology

## 2019-10-21 ENCOUNTER — Inpatient Hospital Stay (HOSPITAL_COMMUNITY): Payer: Medicare HMO | Admitting: Hematology

## 2019-10-21 ENCOUNTER — Other Ambulatory Visit: Payer: Self-pay

## 2019-10-21 VITALS — BP 142/87 | HR 84 | Temp 97.8°F | Resp 18 | Wt 197.0 lb

## 2019-10-21 DIAGNOSIS — Z9011 Acquired absence of right breast and nipple: Secondary | ICD-10-CM | POA: Diagnosis not present

## 2019-10-21 DIAGNOSIS — Z7982 Long term (current) use of aspirin: Secondary | ICD-10-CM | POA: Diagnosis not present

## 2019-10-21 DIAGNOSIS — I252 Old myocardial infarction: Secondary | ICD-10-CM | POA: Diagnosis not present

## 2019-10-21 DIAGNOSIS — N183 Chronic kidney disease, stage 3 unspecified: Secondary | ICD-10-CM | POA: Diagnosis not present

## 2019-10-21 DIAGNOSIS — Z85850 Personal history of malignant neoplasm of thyroid: Secondary | ICD-10-CM | POA: Diagnosis not present

## 2019-10-21 DIAGNOSIS — G479 Sleep disorder, unspecified: Secondary | ICD-10-CM | POA: Diagnosis not present

## 2019-10-21 DIAGNOSIS — Z8249 Family history of ischemic heart disease and other diseases of the circulatory system: Secondary | ICD-10-CM | POA: Diagnosis not present

## 2019-10-21 DIAGNOSIS — R0602 Shortness of breath: Secondary | ICD-10-CM | POA: Diagnosis not present

## 2019-10-21 DIAGNOSIS — Z801 Family history of malignant neoplasm of trachea, bronchus and lung: Secondary | ICD-10-CM | POA: Diagnosis not present

## 2019-10-21 DIAGNOSIS — Z90710 Acquired absence of both cervix and uterus: Secondary | ICD-10-CM | POA: Diagnosis not present

## 2019-10-21 DIAGNOSIS — Z86718 Personal history of other venous thrombosis and embolism: Secondary | ICD-10-CM | POA: Diagnosis not present

## 2019-10-21 DIAGNOSIS — Z79899 Other long term (current) drug therapy: Secondary | ICD-10-CM | POA: Diagnosis not present

## 2019-10-21 DIAGNOSIS — Z853 Personal history of malignant neoplasm of breast: Secondary | ICD-10-CM

## 2019-10-21 DIAGNOSIS — Z8572 Personal history of non-Hodgkin lymphomas: Secondary | ICD-10-CM | POA: Diagnosis not present

## 2019-10-21 NOTE — Assessment & Plan Note (Signed)
1.  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 -Physical exam did not reveal any palpable adenopathy.  No B symptoms. -Blood work is grossly within normal limits.  2.  Stage II right breast cancer: -Diagnosed in 1987, status post right lumpectomy and axillary lymph node dissection at [facility].  One lymph node was positive. -Adjuvant radiation therapy and tamoxifen. -We reviewed mammogram from 11/19/2018 which was BI-RADS Category 4. -She never followed up for biopsy because of Covid. -We will schedule her for another mammogram.  Physical exam today did not reveal any palpable mass in the right breast or axillary adenopathy.  3.  Thyroid cancer: -Status post near total thyroidectomy.

## 2019-10-21 NOTE — Patient Instructions (Addendum)
[facility] Discharge Instructions  You were seen today by Dr. Delton Coombes. He went over your recent lab results. He will schedule you for a mammogram to follow up on area found in last mammogram. He will follow up with you by phone for results of mammogram.   Thank you for choosing [place] at [facility] to provide your oncology and hematology care.  To afford each patient quality time with our provider, please arrive at least 15 minutes before your scheduled appointment time.   If you have a lab appointment with the [facility] please come in thru the  Main Entrance and check in at the main information desk  You need to re-schedule your appointment should you arrive 10 or more minutes late.  We strive to give you quality time with our providers, and arriving late affects you and other patients whose appointments are after yours.  Also, if you no show three or more times for appointments you may be dismissed from the clinic at the providers discretion.     Again, thank you for choosing [facility].  Our hope is that these requests will decrease the amount of time that you wait before being seen by our physicians.       _____________________________________________________________  Should you have questions after your visit to [facility], please contact our office at [phone] between the hours of 8:00 a.m. and 4:30 p.m.  Voicemails left after 4:00 p.m. will not be returned until the following business day.  For prescription refill requests, have your pharmacy contact our office and allow 72 hours.    Cancer Center Support Programs:   > Cancer Support Group  2nd Tuesday of the month 1pm-2pm, Journey Room

## 2019-10-21 NOTE — Progress Notes (Signed)
[name], [place] [id number]   CLINIC:  Medical Oncology/Hematology  PCP:  Christine Evens, MD [place] [address] [phone]   REASON FOR VISIT: Follow-up for Stage II right breast cancer diagnosed in [place] Thyroid carcinoma   CURRENT THERAPY: Observation.   INTERVAL HISTORY:  [name] 86 y.o. female seen for routine follow-up of breast cancer, Hodgkin's lymphoma.  She had mammogram done in 11/19/2018 and was recommended to have biopsy.  She reports appetite was 100% and energy levels are 75%.  No new onset pains.  Shortness of breath on exertion due to asthma is stable.  Numbness in the right hand and feet has been stable.    REVIEW OF SYSTEMS:  Review of Systems  Respiratory: Positive for shortness of breath.   Neurological: Positive for numbness.  Psychiatric/Behavioral: Positive for sleep disturbance.  All other systems reviewed and are negative.    PAST MEDICAL/SURGICAL HISTORY:  Past Medical History:  Diagnosis Date  . Asthma   . Breast cancer, right breast ([place])   . CKD (chronic kidney disease), stage III   . Coronary atherosclerosis    a. cardiac catheterization in 2004 with 60% mid LAD b. nonobstructive disease by cath in 07/2018 at time of NSTEMI  . DVT (deep venous thrombosis) ([place])   . Essential hypertension   . History of breast cancer 1987   Right-sided, mastectomy with axillary node dissection and Adriamycin   . History of thyroid cancer   . Hodgkin lymphoma ([place]) 04/03/2017  . Hodgkin's disease ([place])   . Hypothyroidism   . Nonischemic cardiomyopathy ([place])    a. EF 35-40% by echo in 07/2018  . NSTEMI (non-ST elevated myocardial infarction) ([place]) 07/27/2018  . Obesity   . Osteoporosis   . Peripheral arterial disease ([place])    Stent to the left common iliac in 2006  . Thyroid carcinoma ([place]) 04/03/2017   Past Surgical History:  Procedure Laterality Date  .  ABDOMINAL HYSTERECTOMY    . ANGIOPLASTY / STENTING FEMORAL Left 07/13/2005  . APPENDECTOMY    . BUNIONECTOMY Right   . CATARACT EXTRACTION W/PHACO  05/28/2012   Procedure: CATARACT EXTRACTION PHACO AND INTRAOCULAR LENS PLACEMENT (IOC);  Surgeon: Christine Guadeloupe T. Gershon Crane, MD;  Location: [facility];  Service: Ophthalmology;  Laterality: Right;  CDE 12.86  . CATARACT EXTRACTION W/PHACO  06/04/2012   Procedure: CATARACT EXTRACTION PHACO AND INTRAOCULAR LENS PLACEMENT (IOC);  Surgeon: Christine Guadeloupe T. Gershon Crane, MD;  Location: [facility];  Service: Ophthalmology;  Laterality: Left;  CDE:15.32  . COLONOSCOPY    . LEFT HEART CATH AND CORONARY ANGIOGRAPHY N/A 07/30/2018   Procedure: LEFT HEART CATH AND CORONARY ANGIOGRAPHY;  Surgeon: Christine Crome, MD;  Location: [place] CV LAB;  Service: Cardiovascular;  Laterality: N/A;  . MASTECTOMY PARTIAL / LUMPECTOMY W/ AXILLARY LYMPHADENECTOMY Right 1987  . THYROIDECTOMY    . TONSILLECTOMY    . YAG LASER APPLICATION Right 21/19/4174   Procedure: YAG LASER APPLICATION;  Surgeon: Christine Guys, MD;  Location: [facility];  Service: Ophthalmology;  Laterality: Right;  . YAG LASER APPLICATION Left 0/04/1447   Procedure: YAG LASER APPLICATION;  Surgeon: Christine Guys, MD;  Location: [facility];  Service: Ophthalmology;  Laterality: Left;     SOCIAL HISTORY:  Social History   Socioeconomic History  . Marital status: Widowed    Spouse name: Not on file  . Number of children: Not on file  . Years of education:  Not on file  . Highest education level: Not on file  Occupational History  . Not on file  Tobacco Use  . Smoking status: Never Smoker  . Smokeless tobacco: Never Used  Substance and Sexual Activity  . Alcohol use: No  . Drug use: No  . Sexual activity: Not Currently    Birth control/protection: Surgical  Other Topics Concern  . Not on file  Social History Narrative  . Not on file   Social Determinants of Health   Financial Resource Strain:   . Difficulty of Paying Living  Expenses: Not on file  Food Insecurity:   . Worried About Charity fundraiser in the Last Year: Not on file  . Ran Out of Food in the Last Year: Not on file  Transportation Needs:   . Lack of Transportation (Medical): Not on file  . Lack of Transportation (Non-Medical): Not on file  Physical Activity:   . Days of Exercise per Week: Not on file  . Minutes of Exercise per Session: Not on file  Stress:   . Feeling of Stress : Not on file  Social Connections:   . Frequency of Communication with Friends and Family: Not on file  . Frequency of Social Gatherings with Friends and Family: Not on file  . Attends Religious Services: Not on file  . Active Member of Clubs or Organizations: Not on file  . Attends Archivist Meetings: Not on file  . Marital Status: Not on file  Intimate Partner Violence:   . Fear of Current or Ex-Partner: Not on file  . Emotionally Abused: Not on file  . Physically Abused: Not on file  . Sexually Abused: Not on file    FAMILY HISTORY:  Family History  Problem Relation Age of Onset  . Lung cancer Mother   . Heart attack Father     CURRENT MEDICATIONS:  Outpatient Encounter Medications as of 10/21/2019  Medication Sig  . aspirin 81 MG tablet Take 81 mg by mouth daily.  Marland Kitchen atorvastatin (LIPITOR) 80 MG tablet Take 1 tablet (80 mg total) by mouth daily.  . calcium-vitamin D (OSCAL WITH D) 500-200 MG-UNIT tablet Take 1 tablet by mouth 3 (three) times daily.  . carvedilol (COREG) 3.125 MG tablet Take 1 tablet (3.125 mg total) by mouth 2 (two) times daily with a meal.  . clopidogrel (PLAVIX) 75 MG tablet Take 1 tablet by mouth once daily  . FLOVENT HFA 44 MCG/ACT inhaler Inhale 2 puffs into the lungs 2 (two) times daily.  Marland Kitchen levothyroxine (SYNTHROID, LEVOTHROID) 100 MCG tablet Take 100 mcg by mouth daily.  . Multiple Vitamins-Calcium (ONE-A-DAY WOMENS PO) Take 1 tablet by mouth daily.  . sacubitril-valsartan (ENTRESTO) 24-26 MG Take 1 tablet by mouth 2  (two) times daily.  Marland Kitchen SSD 1 % cream APPLY A SMALL AMOUNT TOPICALLY TO THE FINGER TWICE DAILY. AVOID CONTACT WITH EYES.  . temazepam (RESTORIL) 30 MG capsule Take 30 mg by mouth at bedtime.  . vitamin C (ASCORBIC ACID) 500 MG tablet Take 500 mg by mouth daily.  . nitroGLYCERIN (NITROSTAT) 0.4 MG SL tablet Place 1 tablet (0.4 mg total) under the tongue every 5 (five) minutes x 3 doses as needed for chest pain. (Patient not taking: Reported on 10/21/2019)   No facility-administered encounter medications on file as of 10/21/2019.    ALLERGIES:  No Known Allergies   PHYSICAL EXAM:  ECOG Performance status: 1  Vitals:   10/21/19 1446  BP: (!) 142/87  Pulse: 84  Resp: 18  Temp: 97.8 F (36.6 C)  SpO2: 98%   Filed Weights   10/21/19 1446  Weight: 197 lb (89.4 kg)    Physical Exam Constitutional:      Appearance: Normal appearance. She is normal weight.  Cardiovascular:     Rate and Rhythm: Normal rate and regular rhythm.     Heart sounds: Normal heart sounds.  Pulmonary:     Effort: Pulmonary effort is normal.     Breath sounds: Normal breath sounds.  Abdominal:     General: Abdomen is flat.     Palpations: Abdomen is soft.  Musculoskeletal:        General: Normal range of motion.  Skin:    General: Skin is warm and dry.  Neurological:     Mental Status: She is alert and oriented to person, place, and time. Mental status is at baseline.  Psychiatric:        Mood and Affect: Mood normal.        Behavior: Behavior normal.        Thought Content: Thought content normal.        Judgment: Judgment normal.   Breast: No palpable masses in the breasts or lymphadenopathy.   LABORATORY DATA:  I have reviewed the labs as listed.  CBC    Component Value Date/Time   WBC 6.4 10/14/2019 1406   RBC 4.28 10/14/2019 1406   HGB 12.0 10/14/2019 1406   HCT 38.7 10/14/2019 1406   PLT 237 10/14/2019 1406   MCV 90.4 10/14/2019 1406   MCH 28.0 10/14/2019 1406   MCHC 31.0 10/14/2019  1406   RDW 15.9 (H) 10/14/2019 1406   LYMPHSABS 1.7 10/14/2019 1406   MONOABS 0.7 10/14/2019 1406   EOSABS 0.1 10/14/2019 1406   BASOSABS 0.1 10/14/2019 1406   CMP Latest Ref Rng & Units 10/14/2019 06/13/2019 10/15/2018  Glucose 70 - 99 mg/dL 101(H) 100(H) 91  BUN 8 - 23 mg/dL 32(H) 28(H) 28(H)  Creatinine 0.44 - 1.00 mg/dL 1.55(H) 1.33(H) 1.64(H)  Sodium 135 - 145 mmol/L 142 142 140  Potassium 3.5 - 5.1 mmol/L 4.7 4.3 4.7  Chloride 98 - 111 mmol/L 104 109 103  CO2 22 - 32 mmol/L 26 26 28   Calcium 8.9 - 10.3 mg/dL 7.3(L) 7.3(L) 7.4(L)  Total Protein 6.5 - 8.1 g/dL 7.6 - -  Total Bilirubin 0.3 - 1.2 mg/dL 0.9 - -  Alkaline Phos 38 - 126 U/L 63 - -  AST 15 - 41 U/L 24 - -  ALT 0 - 44 U/L 20 - -       DIAGNOSTIC IMAGING:  I have independently reviewed the scans and discussed with the patient.     ASSESSMENT & PLAN:   History of right breast cancer 1.  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 -Physical exam did not reveal any palpable adenopathy.  No B symptoms. -Blood work is grossly within normal limits.  2.  Stage II right breast cancer: -Diagnosed in 1987, status post right lumpectomy and axillary lymph node dissection at [facility].  One lymph node was positive. -Adjuvant radiation therapy and tamoxifen. -We reviewed mammogram from 11/19/2018 which was BI-RADS Category 4. -She never followed up for biopsy because of Covid. -We will schedule her for another mammogram.  Physical exam today did not reveal any palpable mass in the right breast or axillary adenopathy.  3.  Thyroid cancer: -Status post near  total thyroidectomy.      Orders placed this encounter:  Orders Placed This Encounter  Procedures  . MM DIAG BREAST TOMO BILATERAL      Derek Jack, MD [place] [phone]

## 2019-10-22 ENCOUNTER — Other Ambulatory Visit (HOSPITAL_COMMUNITY): Payer: Self-pay | Admitting: Hematology

## 2019-10-22 DIAGNOSIS — R928 Other abnormal and inconclusive findings on diagnostic imaging of breast: Secondary | ICD-10-CM

## 2019-10-29 IMAGING — MG 2D DIGITAL SCREENING BILATERAL MAMMOGRAM WITH 3D TOMO WITH CAD
6 of 10 series · 6 of 26 positions shown · non-contrast
Comparison: Previous exam(s).

CLINICAL DATA: Screening.

EXAM:
2D DIGITAL SCREENING BILATERAL MAMMOGRAM WITH 3D TOMO WITH CAD

[R MLO (1 of 2)]
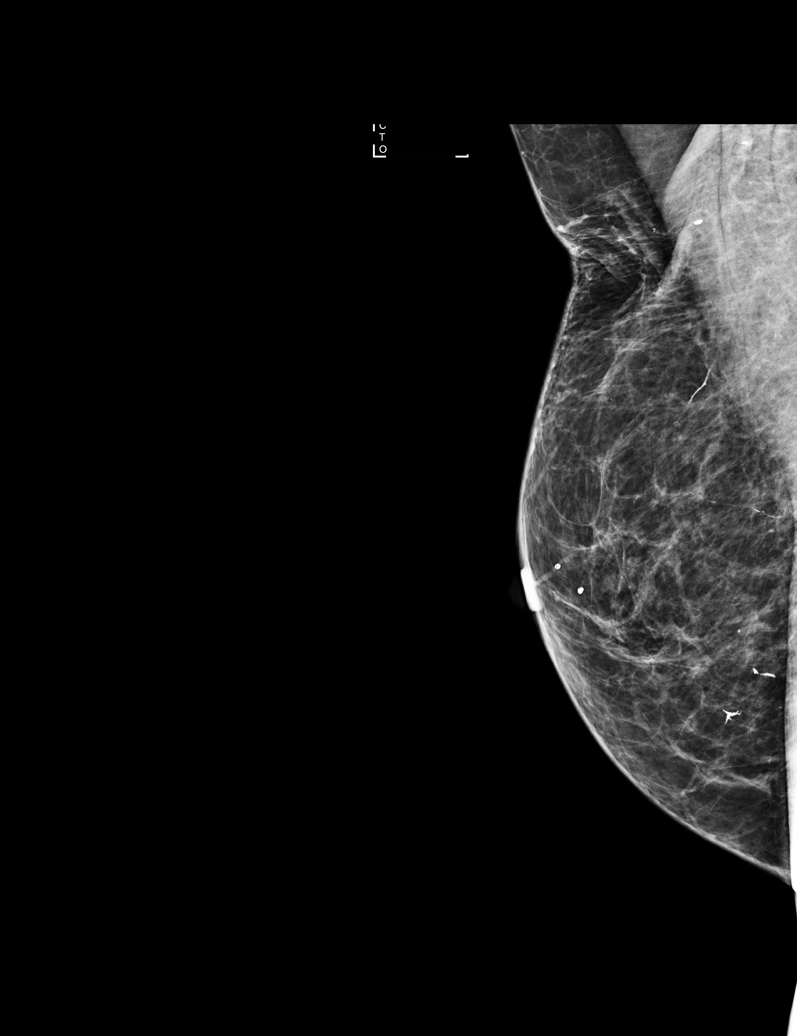

[L MLO (1 of 2)]
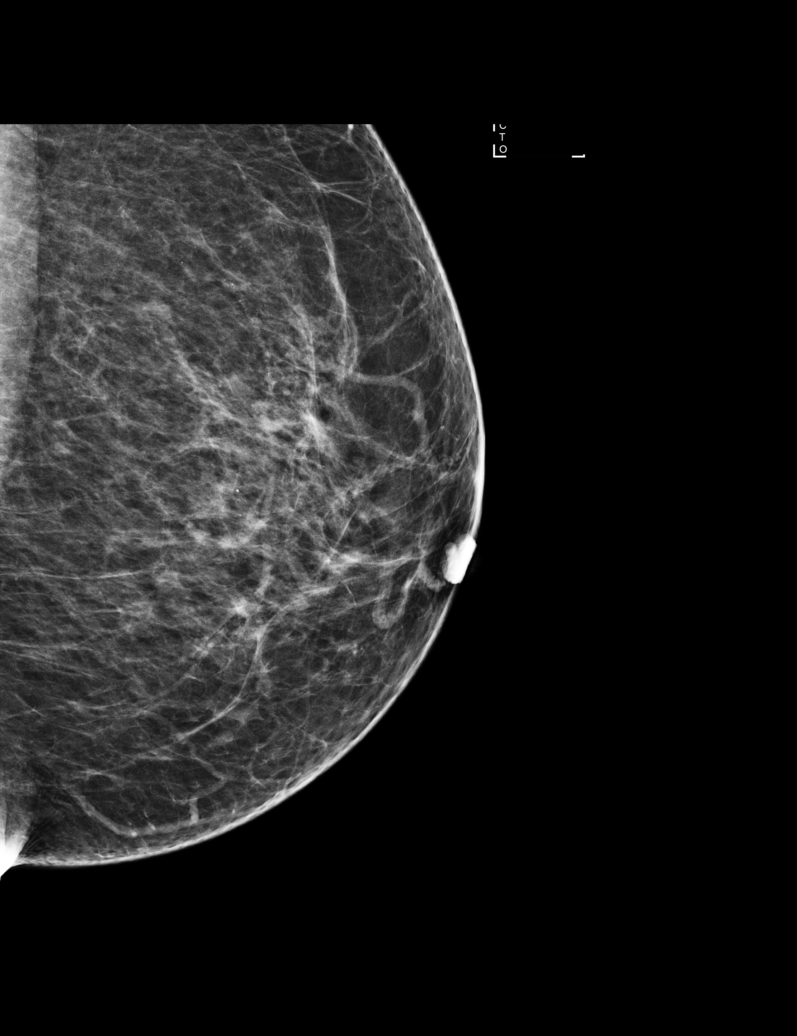

[L MLO (2 of 2)]
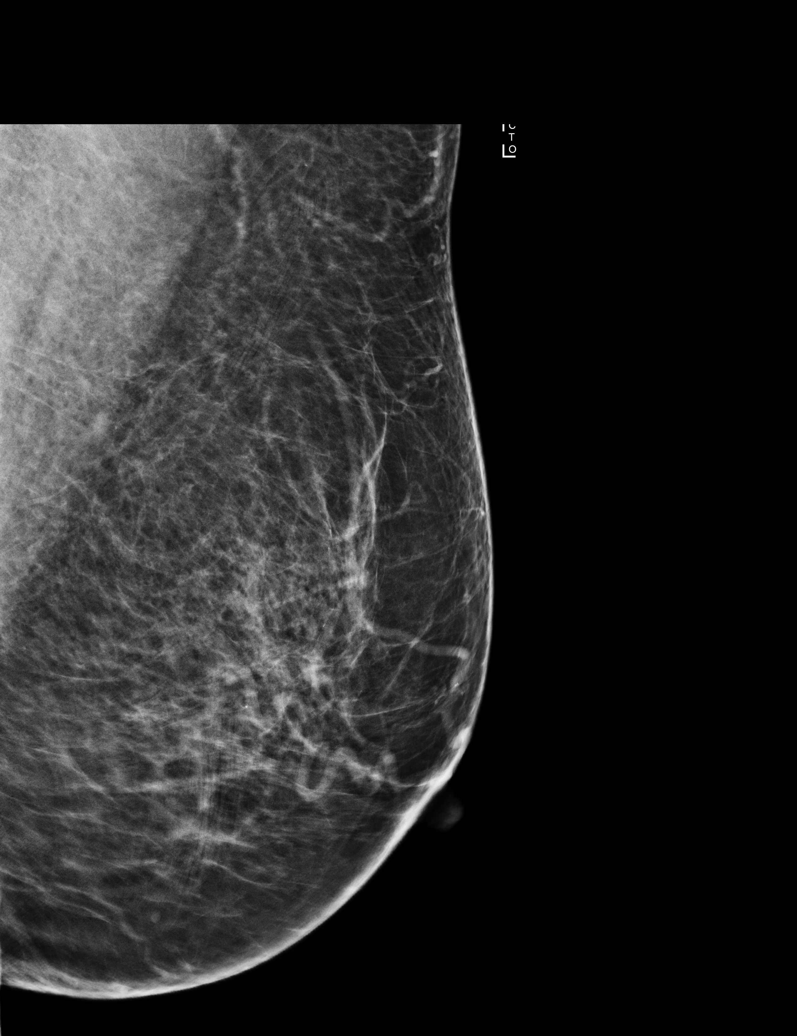

[R CC]
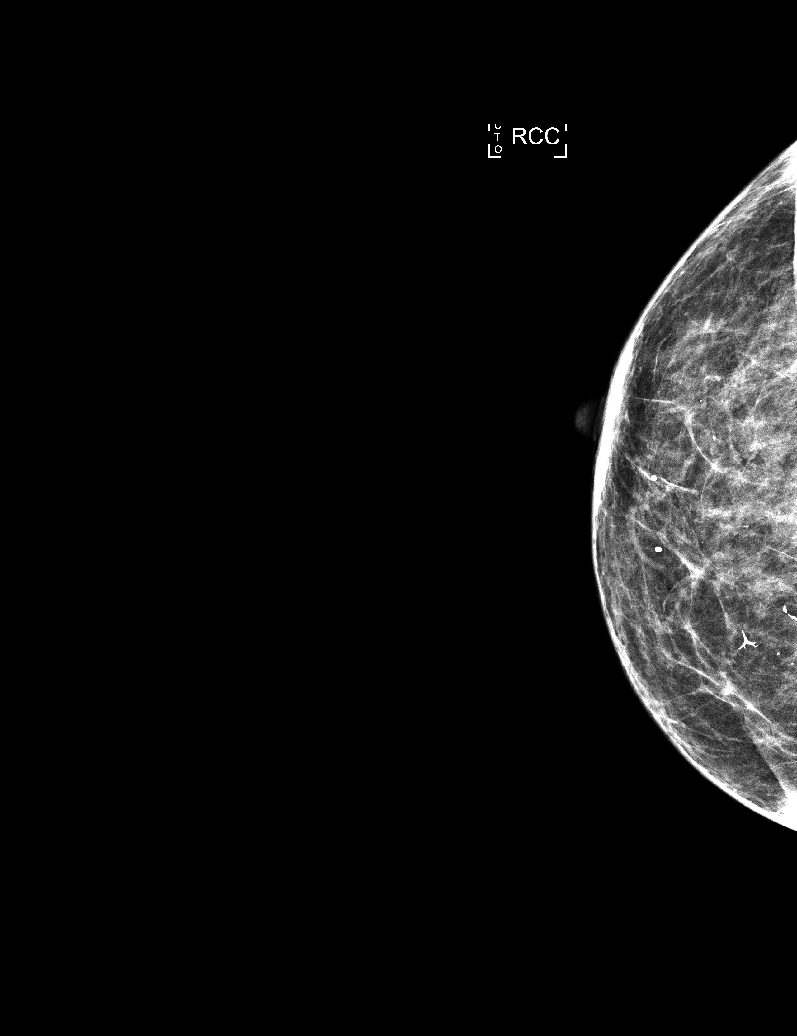

[L CC]
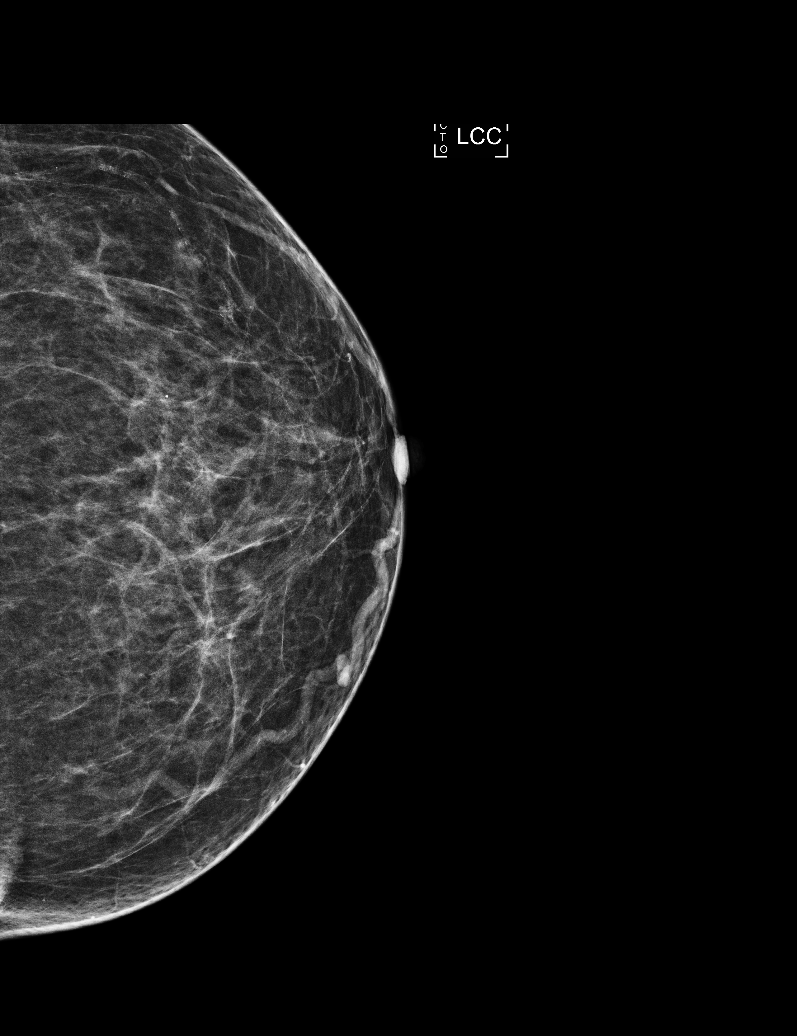

[R MLO (2 of 2)]
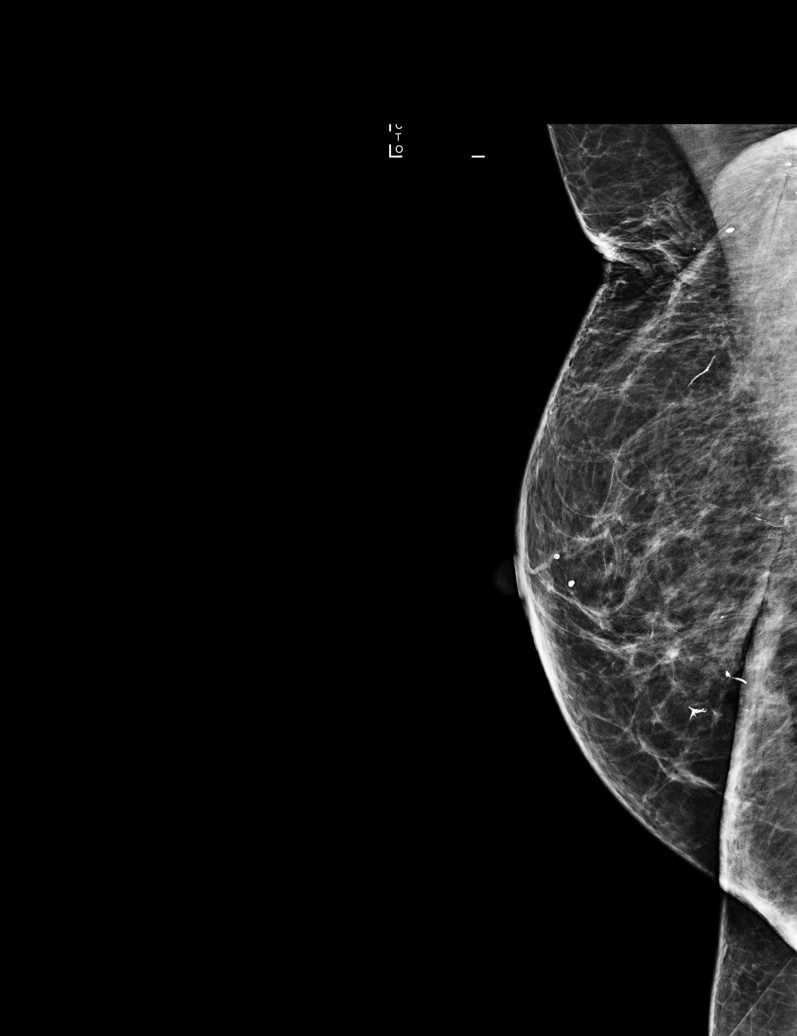

[6 of 26 positions shown; findings below may reference images not displayed]

ACR Breast Density Category b: There are scattered areas of
fibroglandular density.
FINDINGS: There are no findings suspicious for malignancy. Images were
processed with CAD.
IMPRESSION: No mammographic evidence of malignancy. A result letter of this
screening mammogram will be mailed directly to the patient.

RECOMMENDATION:
Screening mammogram in one year. (Code:[id number])

BI-RADS CATEGORY  1: Negative.

## 2019-11-04 ENCOUNTER — Ambulatory Visit (HOSPITAL_COMMUNITY): Payer: Medicare HMO

## 2019-11-04 ENCOUNTER — Ambulatory Visit (HOSPITAL_COMMUNITY): Admission: RE | Admit: 2019-11-04 | Payer: Medicare HMO | Source: Ambulatory Visit

## 2019-11-04 ENCOUNTER — Other Ambulatory Visit: Payer: Self-pay

## 2019-11-04 ENCOUNTER — Ambulatory Visit (HOSPITAL_COMMUNITY)
Admission: RE | Admit: 2019-11-04 | Discharge: 2019-11-04 | Disposition: A | Discharge status: Home / Self Care | Payer: Medicare HMO | Source: Ambulatory Visit | Attending: Hematology | Admitting: Hematology

## 2019-11-04 DIAGNOSIS — Z853 Personal history of malignant neoplasm of breast: Secondary | ICD-10-CM | POA: Diagnosis present

## 2019-11-06 ENCOUNTER — Other Ambulatory Visit (HOSPITAL_COMMUNITY): Payer: Self-pay | Admitting: *Deleted

## 2019-11-06 ENCOUNTER — Encounter (HOSPITAL_COMMUNITY): Payer: Self-pay | Admitting: Hematology

## 2019-11-06 ENCOUNTER — Inpatient Hospital Stay (HOSPITAL_COMMUNITY): Payer: Medicare HMO | Attending: Hematology | Admitting: Hematology

## 2019-11-06 DIAGNOSIS — Z853 Personal history of malignant neoplasm of breast: Secondary | ICD-10-CM

## 2019-11-06 DIAGNOSIS — C8100 Nodular lymphocyte predominant Hodgkin lymphoma, unspecified site: Secondary | ICD-10-CM

## 2019-11-06 NOTE — Progress Notes (Signed)
Virtual Visit via Telephone Note  I connected with [name] on 11/06/19 at  8:00 AM EST by telephone and verified that I am speaking with the correct person using two identifiers.   I discussed the limitations, risks, security and privacy concerns of performing an evaluation and management service by telephone and the availability of in person appointments. I also discussed with the patient that there may be a patient responsible charge related to this service. The patient expressed understanding and agreed to proceed.   History of Present Illness: She follows in the clinic for stage II right breast cancer, thyroid cancer and nodular lymphocyte predominant Hodgkin's disease.  She had abnormal mammogram on 11/19/2018 which was BI-RADS Category 4.  She never followed up for biopsy because of Covid.   Observations/Objective: She is doing well and denies any new onset pains.  Denies any B symptoms.  Assessment and Plan:  1.  Stage II right breast cancer: -Diagnosed in 1987, status post lumpectomy and ALND at [facility].  1 lymph node was positive.  She received adjuvant radiation with tamoxifen. -We reviewed results of the mammogram dated 11/04/2019 which showed stable right breast calcifications, most likely benign.  1 year follow-up was recommended to ensure 2-year stability.  No suspicious findings otherwise. -We talked about the mammogram results.  We will schedule her for 1 year mammogram and see her after that.   Follow Up Instructions: RTC 1 year with mammogram and labs.   I discussed the assessment and treatment plan with the patient. The patient was provided an opportunity to ask questions and all were answered. The patient agreed with the plan and demonstrated an understanding of the instructions.   The patient was advised to call back or seek an in-person evaluation if the symptoms worsen or if the condition fails to improve as anticipated.  I provided 5 minutes of  non-face-to-face time during this encounter.   Derek Jack, MD

## 2019-11-14 ENCOUNTER — Telehealth: Payer: Self-pay | Admitting: Cardiology

## 2019-11-14 NOTE — Telephone Encounter (Signed)

## 2019-11-16 NOTE — Progress Notes (Signed)
Virtual Visit via Telephone Note   This visit type was conducted due to national recommendations for restrictions regarding the COVID-19 Pandemic (e.g. social distancing) in an effort to limit this patient's exposure and mitigate transmission in our community.  Due to her co-morbid illnesses, this patient is at least at moderate risk for complications without adequate follow up.  This format is felt to be most appropriate for this patient at this time.  The patient did not have access to video technology/had technical difficulties with video requiring transitioning to audio format only (telephone).  All issues noted in this document were discussed and addressed.  No physical exam could be performed with this format.  Please refer to the patient's chart for her  consent to telehealth for [facility].   Date:  11/17/2019   ID:  [name], DOB 12/02/1929, MRN [id number]  Patient Location: Home Provider Location: Home  PCP:  Lemmie Evens, MD  Cardiologist:  Rozann Lesches, MD Electrophysiologist:  None   Evaluation Performed:  Follow-Up Visit  Chief Complaint:  Cardiac follow-up  History of Present Illness:    [name] is an 86 y.o. female seen in September 2020.  We spoke by phone today.  She does not report any progressive shortness of breath, no palpitations or syncope.  Still living in her own home and functional with ADLs.  Follow-up echocardiogram in November 2020 showed LVEF 30 to 35%, some improvement compared to previous assessment.  She also had moderate mitral regurgitation, sclerotic aortic valve without stenosis.  She has had some mild swelling in her left foot, no orthopnea or PND.  I reviewed her current cardiac regimen which is outlined below.  She has not been on a standing diuretic, also not on Aldactone.  Last creatinine was 1.5.  The patient does not have symptoms concerning for COVID-19 infection (fever, chills, cough, or new shortness of  breath).  She does plan to get the vaccine.   Past Medical History:  Diagnosis Date  . Asthma   . Breast cancer, right breast ([place])   . CKD (chronic kidney disease), stage III   . Coronary atherosclerosis    a. cardiac catheterization in 2004 with 60% mid LAD b. nonobstructive disease by cath in 07/2018 at time of NSTEMI  . DVT (deep venous thrombosis) ([place])   . Essential hypertension   . History of breast cancer 1987   Right-sided, mastectomy with axillary node dissection and Adriamycin   . History of thyroid cancer   . Hodgkin lymphoma ([place]) 04/03/2017  . Hodgkin's disease ([place])   . Hypothyroidism   . Nonischemic cardiomyopathy ([place])    a. EF 35-40% by echo in 07/2018  . NSTEMI (non-ST elevated myocardial infarction) ([place]) 07/27/2018  . Obesity   . Osteoporosis   . Peripheral arterial disease ([place])    Stent to the left common iliac in 2006  . Thyroid carcinoma ([place]) 04/03/2017   Past Surgical History:  Procedure Laterality Date  . ABDOMINAL HYSTERECTOMY    . ANGIOPLASTY / STENTING FEMORAL Left 07/13/2005  . APPENDECTOMY    . BUNIONECTOMY Right   . CATARACT EXTRACTION W/PHACO  05/28/2012   Procedure: CATARACT EXTRACTION PHACO AND INTRAOCULAR LENS PLACEMENT (IOC);  Surgeon: Elta Guadeloupe T. Gershon Crane, MD;  Location: [facility];  Service: Ophthalmology;  Laterality: Right;  CDE 12.86  . CATARACT EXTRACTION W/PHACO  06/04/2012   Procedure: CATARACT EXTRACTION PHACO AND INTRAOCULAR LENS PLACEMENT (IOC);  Surgeon: Elta Guadeloupe T. Gershon Crane, MD;  Location: [facility];  Service: Ophthalmology;  Laterality: Left;  CDE:15.32  . COLONOSCOPY    . LEFT HEART CATH AND CORONARY ANGIOGRAPHY N/A 07/30/2018   Procedure: LEFT HEART CATH AND CORONARY ANGIOGRAPHY;  Surgeon: Belva Crome, MD;  Location: [place] CV LAB;  Service: Cardiovascular;  Laterality: N/A;  . MASTECTOMY PARTIAL / LUMPECTOMY W/ AXILLARY LYMPHADENECTOMY Right 1987  . THYROIDECTOMY    . TONSILLECTOMY    . YAG LASER APPLICATION Right 51/88/4166    Procedure: YAG LASER APPLICATION;  Surgeon: Rutherford Guys, MD;  Location: [facility];  Service: Ophthalmology;  Laterality: Right;  . YAG LASER APPLICATION Left 0/02/3015   Procedure: YAG LASER APPLICATION;  Surgeon: Rutherford Guys, MD;  Location: [facility];  Service: Ophthalmology;  Laterality: Left;     Current Meds  Medication Sig  . aspirin 81 MG tablet Take 81 mg by mouth daily.  Marland Kitchen atorvastatin (LIPITOR) 80 MG tablet Take 1 tablet (80 mg total) by mouth daily.  . calcium-vitamin D (OSCAL WITH D) 500-200 MG-UNIT tablet Take 1 tablet by mouth 3 (three) times daily.  . carvedilol (COREG) 3.125 MG tablet Take 1 tablet (3.125 mg total) by mouth 2 (two) times daily with a meal.  . clopidogrel (PLAVIX) 75 MG tablet Take 1 tablet by mouth once daily  . FLOVENT HFA 44 MCG/ACT inhaler Inhale 2 puffs into the lungs 2 (two) times daily.  Marland Kitchen levothyroxine (SYNTHROID, LEVOTHROID) 100 MCG tablet Take 100 mcg by mouth daily.  . Multiple Vitamins-Calcium (ONE-A-DAY WOMENS PO) Take 1 tablet by mouth daily.  . nitroGLYCERIN (NITROSTAT) 0.4 MG SL tablet Place 1 tablet (0.4 mg total) under the tongue every 5 (five) minutes x 3 doses as needed for chest pain.  . sacubitril-valsartan (ENTRESTO) 24-26 MG Take 1 tablet by mouth 2 (two) times daily.  . temazepam (RESTORIL) 30 MG capsule Take 30 mg by mouth at bedtime.  . vitamin C (ASCORBIC ACID) 500 MG tablet Take 500 mg by mouth daily.     Allergies:   Patient has no known allergies.   ROS:  No syncope.  Prior CV studies:   The following studies were reviewed today:  Echocardiogram 08/14/2019: 1. Left ventricular ejection fraction, by visual estimation, is 30 to  35%. The left ventricle has moderate to severely decreased function. There  is borderline left ventricular hypertrophy.  2. Elevated left ventricular end-diastolic pressure.  3. Left ventricular diastolic parameters are consistent with Grade I  diastolic dysfunction (impaired relaxation).  4. The  left ventricle demonstrates global hypokinesis.  5. Global right ventricle has mildly reduced systolic function.The right  ventricular size is normal. No increase in right ventricular wall  thickness.  6. Left atrial size was mildly dilated.  7. Right atrial size was normal.  8. Moderate aortic valve annular calcification.  9. Moderate mitral annular calcification.  10. The mitral valve is degenerative. Moderate mitral valve regurgitation.  11. The tricuspid valve is grossly normal. Tricuspid valve regurgitation  is mild.  12. The aortic valve is tricuspid. Aortic valve regurgitation is not  visualized. Mild aortic valve sclerosis without stenosis.  13. The pulmonic valve was not well visualized. Pulmonic valve  regurgitation is not visualized.  14. Normal pulmonary artery systolic pressure.  15. The inferior vena cava is normal in size with greater than 50%  respiratory variability, suggesting right atrial pressure of 3 mmHg.   Labs/Other Tests and Data Reviewed:    EKG:  An ECG dated 07/27/2018 was personally reviewed today and demonstrated:  Sinus rhythm with  left atrial enlargement and leftward axis.  Recent Labs: 10/14/2019: ALT 20; BUN 32; Creatinine, Ser 1.55; Hemoglobin 12.0; Platelets 237; Potassium 4.7; Sodium 142   Recent Lipid Panel Lab Results  Component Value Date/Time   CHOL 184 06/13/2019 08:40 AM   TRIG 49 06/13/2019 08:40 AM   HDL 71 06/13/2019 08:40 AM   CHOLHDL 2.6 06/13/2019 08:40 AM   LDLCALC 103 (H) 06/13/2019 08:40 AM    Wt Readings from Last 3 Encounters:  10/21/19 197 lb (89.4 kg)  06/12/19 192 lb (87.1 kg)  03/05/19 197 lb (89.4 kg)     Objective:    Vital Signs:  Ht 5\' 9"  (1.753 m)   BMI 29.09 kg/m    Patient spoke in full sentences, not short of breath. No audible wheezing or coughing. Speech pattern normal.  ASSESSMENT & PLAN:    1.  Nonischemic cardiomyopathy, LVEF has increased to the range of 30 to 35% by last assessment on  medical therapy.  She is not a candidate for ICD at age 64, would continue with observation.  May consider addition of low-dose Aldactone presuming renal function remains stable, she is tolerating the present regimen well.  2.  CKD stage IIIb, last creatinine 1.55 and potassium 4.7.  3.  History of moderate, nonobstructive CAD.  No active angina symptoms.  She remains on antiplatelet therapy and statin.    Time:   Today, I have spent 6 minutes with the patient with telehealth technology discussing the above problems.     Medication Adjustments/Labs and Tests Ordered: Current medicines are reviewed at length with the patient today.  Concerns regarding medicines are outlined above.   Tests Ordered: No orders of the defined types were placed in this encounter.   Medication Changes: No orders of the defined types were placed in this encounter.   Follow Up:  In Person 3 months in the [place] office.  Signed, Rozann Lesches, MD  11/17/2019 10:09 AM    [place]

## 2019-11-17 ENCOUNTER — Encounter: Payer: Self-pay | Admitting: Cardiology

## 2019-11-17 ENCOUNTER — Telehealth (INDEPENDENT_AMBULATORY_CARE_PROVIDER_SITE_OTHER): Payer: Medicare HMO | Admitting: Cardiology

## 2019-11-17 VITALS — Ht 69.0 in

## 2019-11-17 DIAGNOSIS — N1832 Chronic kidney disease, stage 3b: Secondary | ICD-10-CM | POA: Diagnosis not present

## 2019-11-17 DIAGNOSIS — I2510 Atherosclerotic heart disease of native coronary artery without angina pectoris: Secondary | ICD-10-CM | POA: Diagnosis not present

## 2019-11-17 DIAGNOSIS — I10 Essential (primary) hypertension: Secondary | ICD-10-CM

## 2019-11-17 DIAGNOSIS — I428 Other cardiomyopathies: Secondary | ICD-10-CM

## 2019-11-17 NOTE — Patient Instructions (Signed)
Medication Instructions:  Your physician recommends that you continue on your current medications as directed. Please refer to the Current Medication list given to you today.  *If you need a refill on your cardiac medications before your next appointment, please call your pharmacy*  Lab Work: NONE  If you have labs (blood work) drawn today and your tests are completely normal, you will receive your results only by: Marland Kitchen MyChart Message (if you have MyChart) OR . A paper copy in the mail If you have any lab test that is abnormal or we need to change your treatment, we will call you to review the results.  Testing/Procedures: NONE   Follow-Up: At [facility], you and your health needs are our priority.  As part of our continuing mission to provide you with exceptional heart care, we have created designated Provider Care Teams.  These Care Teams include your primary Cardiologist (physician) and Advanced Practice Providers (APPs -  Physician Assistants and Nurse Practitioners) who all work together to provide you with the care you need, when you need it.  Your next appointment:   3 month(s)  The format for your next appointment:   In Person  Provider:   Rozann Lesches, MD  Other Instructions Thank you for choosing [facility]!

## 2019-12-15 ENCOUNTER — Telehealth: Payer: Self-pay | Admitting: *Deleted

## 2019-12-15 NOTE — Telephone Encounter (Signed)
Pt notified that she has been approved for [employer].

## 2020-02-17 ENCOUNTER — Ambulatory Visit: Payer: Medicare HMO | Admitting: Cardiology

## 2020-02-17 NOTE — Progress Notes (Deleted)
Cardiology Office Note  Date: 02/17/2020   ID: [name], DOB 04-26-1930, MRN [id number]  PCP:  Lemmie Evens, MD  Cardiologist:  Rozann Lesches, MD Electrophysiologist:  None   No chief complaint on file.   History of Present Illness: [name] is an 86 y.o. female last assessed via telehealth encounter in February.  Past Medical History:  Diagnosis Date  . Asthma   . Breast cancer, right breast ([place])   . CKD (chronic kidney disease), stage III   . Coronary atherosclerosis    a. cardiac catheterization in 2004 with 60% mid LAD b. nonobstructive disease by cath in 07/2018 at time of NSTEMI  . DVT (deep venous thrombosis) ([place])   . Essential hypertension   . History of breast cancer 1987   Right-sided, mastectomy with axillary node dissection and Adriamycin   . History of thyroid cancer   . Hodgkin lymphoma ([place]) 04/03/2017  . Hodgkin's disease ([place])   . Hypothyroidism   . Nonischemic cardiomyopathy ([place])    a. EF 35-40% by echo in 07/2018  . NSTEMI (non-ST elevated myocardial infarction) ([place]) 07/27/2018  . Obesity   . Osteoporosis   . Peripheral arterial disease ([place])    Stent to the left common iliac in 2006  . Thyroid carcinoma ([place]) 04/03/2017    Past Surgical History:  Procedure Laterality Date  . ABDOMINAL HYSTERECTOMY    . ANGIOPLASTY / STENTING FEMORAL Left 07/13/2005  . APPENDECTOMY    . BUNIONECTOMY Right   . CATARACT EXTRACTION W/PHACO  05/28/2012   Procedure: CATARACT EXTRACTION PHACO AND INTRAOCULAR LENS PLACEMENT (IOC);  Surgeon: Elta Guadeloupe T. Gershon Crane, MD;  Location: [facility];  Service: Ophthalmology;  Laterality: Right;  CDE 12.86  . CATARACT EXTRACTION W/PHACO  06/04/2012   Procedure: CATARACT EXTRACTION PHACO AND INTRAOCULAR LENS PLACEMENT (IOC);  Surgeon: Elta Guadeloupe T. Gershon Crane, MD;  Location: [facility];  Service: Ophthalmology;  Laterality: Left;  CDE:15.32  . COLONOSCOPY    . LEFT HEART CATH AND CORONARY ANGIOGRAPHY N/A 07/30/2018   Procedure:  LEFT HEART CATH AND CORONARY ANGIOGRAPHY;  Surgeon: Belva Crome, MD;  Location: [place] CV LAB;  Service: Cardiovascular;  Laterality: N/A;  . MASTECTOMY PARTIAL / LUMPECTOMY W/ AXILLARY LYMPHADENECTOMY Right 1987  . THYROIDECTOMY    . TONSILLECTOMY    . YAG LASER APPLICATION Right 41/96/2229   Procedure: YAG LASER APPLICATION;  Surgeon: Rutherford Guys, MD;  Location: [facility];  Service: Ophthalmology;  Laterality: Right;  . YAG LASER APPLICATION Left 04/02/8920   Procedure: YAG LASER APPLICATION;  Surgeon: Rutherford Guys, MD;  Location: [facility];  Service: Ophthalmology;  Laterality: Left;    Current Outpatient Medications  Medication Sig Dispense Refill  . aspirin 81 MG tablet Take 81 mg by mouth daily.    Marland Kitchen atorvastatin (LIPITOR) 80 MG tablet Take 1 tablet (80 mg total) by mouth daily. 90 tablet 3  . calcium-vitamin D (OSCAL WITH D) 500-200 MG-UNIT tablet Take 1 tablet by mouth 3 (three) times daily. 90 tablet 1  . carvedilol (COREG) 3.125 MG tablet Take 1 tablet (3.125 mg total) by mouth 2 (two) times daily with a meal. 180 tablet 3  . clopidogrel (PLAVIX) 75 MG tablet Take 1 tablet by mouth once daily 30 tablet 1  . FLOVENT HFA 44 MCG/ACT inhaler Inhale 2 puffs into the lungs 2 (two) times daily.  11  . levothyroxine (SYNTHROID, LEVOTHROID) 100 MCG tablet Take 100 mcg by mouth daily.    . Multiple Vitamins-Calcium (ONE-A-DAY  WOMENS PO) Take 1 tablet by mouth daily.    . nitroGLYCERIN (NITROSTAT) 0.4 MG SL tablet Place 1 tablet (0.4 mg total) under the tongue every 5 (five) minutes x 3 doses as needed for chest pain. 25 tablet 1  . sacubitril-valsartan (ENTRESTO) 24-26 MG Take 1 tablet by mouth 2 (two) times daily. 60 tablet 11  . SSD 1 % cream APPLY A SMALL AMOUNT TOPICALLY TO THE FINGER TWICE DAILY. AVOID CONTACT WITH EYES.    . temazepam (RESTORIL) 30 MG capsule Take 30 mg by mouth at bedtime.    . vitamin C (ASCORBIC ACID) 500 MG tablet Take 500 mg by mouth daily.     No current  facility-administered medications for this visit.   Allergies:  Patient has no known allergies.   ROS:  Please see the history of present illness. Otherwise, complete review of systems is positive for {NONE DEFAULTED:18576::"none"}.  All other systems are reviewed and negative.   Physical Exam: VS:  There were no vitals taken for this visit., BMI There is no height or weight on file to calculate BMI.  Wt Readings from Last 3 Encounters:  10/21/19 197 lb (89.4 kg)  06/12/19 192 lb (87.1 kg)  03/05/19 197 lb (89.4 kg)    General: Patient appears comfortable at rest. HEENT: Conjunctiva and lids normal, oropharynx clear with moist mucosa. Neck: Supple, no elevated JVP or carotid bruits, no thyromegaly. Lungs: Clear to auscultation, nonlabored breathing at rest. Cardiac: Regular rate and rhythm, no S3 or significant systolic murmur, no pericardial rub. Abdomen: Soft, nontender, no hepatomegaly, bowel sounds present, no guarding or rebound. Extremities: No pitting edema, distal pulses 2+. Skin: Warm and dry. Musculoskeletal: No kyphosis. Neuropsychiatric: Alert and oriented x3, affect grossly appropriate.  ECG:  An ECG dated 07/27/2018 was personally reviewed today and demonstrated:  Sinus rhythm with left atrial enlargement and leftward axis.  Recent Labwork: 10/14/2019: ALT 20; AST 24; BUN 32; Creatinine, Ser 1.55; Hemoglobin 12.0; Platelets 237; Potassium 4.7; Sodium 142     Component Value Date/Time   CHOL 184 06/13/2019 0840   TRIG 49 06/13/2019 0840   HDL 71 06/13/2019 0840   CHOLHDL 2.6 06/13/2019 0840   VLDL 10 06/13/2019 0840   LDLCALC 103 (H) 06/13/2019 0840    Other Studies Reviewed Today:  Echocardiogram 08/14/2019: 1. Left ventricular ejection fraction, by visual estimation, is 30 to  35%. The left ventricle has moderate to severely decreased function. There  is borderline left ventricular hypertrophy.  2. Elevated left ventricular end-diastolic pressure.  3.  Left ventricular diastolic parameters are consistent with Grade I  diastolic dysfunction (impaired relaxation).  4. The left ventricle demonstrates global hypokinesis.  5. Global right ventricle has mildly reduced systolic function.The right  ventricular size is normal. No increase in right ventricular wall  thickness.  6. Left atrial size was mildly dilated.  7. Right atrial size was normal.  8. Moderate aortic valve annular calcification.  9. Moderate mitral annular calcification.  10. The mitral valve is degenerative. Moderate mitral valve regurgitation.  11. The tricuspid valve is grossly normal. Tricuspid valve regurgitation  is mild.  12. The aortic valve is tricuspid. Aortic valve regurgitation is not  visualized. Mild aortic valve sclerosis without stenosis.  13. The pulmonic valve was not well visualized. Pulmonic valve  regurgitation is not visualized.  14. Normal pulmonary artery systolic pressure.  15. The inferior vena cava is normal in size with greater than 50%  respiratory variability, suggesting right atrial pressure of  3 mmHg.   Assessment and Plan:   Medication Adjustments/Labs and Tests Ordered: Current medicines are reviewed at length with the patient today.  Concerns regarding medicines are outlined above.   Tests Ordered: No orders of the defined types were placed in this encounter.   Medication Changes: No orders of the defined types were placed in this encounter.   Disposition:  Follow up {follow up:15908}  Signed, Satira Sark, MD, [facility] 02/17/2020 8:35 AM    [facility] [place]. [address], [place], [place] [address] Phone: [phone]; Fax: [phone]

## 2020-03-19 ENCOUNTER — Other Ambulatory Visit: Payer: Self-pay

## 2020-03-19 ENCOUNTER — Ambulatory Visit (INDEPENDENT_AMBULATORY_CARE_PROVIDER_SITE_OTHER): Payer: Medicare HMO | Admitting: Cardiology

## 2020-03-19 ENCOUNTER — Encounter: Payer: Self-pay | Admitting: Cardiology

## 2020-03-19 VITALS — BP 126/84 | HR 78 | Ht 69.0 in | Wt 200.0 lb

## 2020-03-19 DIAGNOSIS — I428 Other cardiomyopathies: Secondary | ICD-10-CM | POA: Diagnosis not present

## 2020-03-19 DIAGNOSIS — I2510 Atherosclerotic heart disease of native coronary artery without angina pectoris: Secondary | ICD-10-CM

## 2020-03-19 DIAGNOSIS — N1832 Chronic kidney disease, stage 3b: Secondary | ICD-10-CM | POA: Diagnosis not present

## 2020-03-19 NOTE — Patient Instructions (Signed)
Medication Instructions:  Your physician recommends that you continue on your current medications as directed. Please refer to the Current Medication list given to you today.  *If you need a refill on your cardiac medications before your next appointment, please call your pharmacy*   Lab Work: None today  If you have labs (blood work) drawn today and your tests are completely normal, you will receive your results only by: Marland Kitchen MyChart Message (if you have MyChart) OR . A paper copy in the mail If you have any lab test that is abnormal or we need to change your treatment, we will call you to review the results.   Testing/Procedures: None today   Follow-Up: At [facility], you and your health needs are our priority.  As part of our continuing mission to provide you with exceptional heart care, we have created designated Provider Care Teams.  These Care Teams include your primary Cardiologist (physician) and Advanced Practice Providers (APPs -  Physician Assistants and Nurse Practitioners) who all work together to provide you with the care you need, when you need it.  We recommend signing up for the patient portal called "MyChart".  Sign up information is provided on this After Visit Summary.  MyChart is used to connect with patients for Virtual Visits (Telemedicine).  Patients are able to view lab/test results, encounter notes, upcoming appointments, etc.  Non-urgent messages can be sent to your provider as well.   To learn more about what you can do with MyChart, go to [email or web address].    Your next appointment:   3-4  month(s)  The format for your next appointment:   In Person  Provider:   Rozann Lesches, MD   Other Instructions None       Thank you for choosing [facility] !

## 2020-03-19 NOTE — Progress Notes (Signed)
Cardiology Office Note  Date: 03/19/2020   ID: [name], DOB 03/22/1930, MRN [id number]  PCP:  Lemmie Evens, MD  Cardiologist:  Rozann Lesches, MD Electrophysiologist:  None   Chief Complaint  Patient presents with  . Cardiac follow-up    History of Present Illness: [name] is an 86 y.o. female last assessed via telehealth encounter in February.  She presents for a follow-up visit.  Now back in her own house, has been staying with her son in [place] while her house is being remodeled.  She reports NYHA class II dyspnea, no chest pain, palpitations, or syncope.  She has been working in her flowers recently.  We discussed plan for follow-up lab work.  She did have some done recently per Dr. Karie Kirks, we will request the results.  I reviewed her medications which are outlined below.  She is not on a diuretic at this time.  Past Medical History:  Diagnosis Date  . Asthma   . Breast cancer, right breast ([place])   . CKD (chronic kidney disease), stage III   . Coronary atherosclerosis    a. cardiac catheterization in 2004 with 60% mid LAD b. nonobstructive disease by cath in 07/2018 at time of NSTEMI  . DVT (deep venous thrombosis) ([place])   . Essential hypertension   . History of breast cancer 1987   Right-sided, mastectomy with axillary node dissection and Adriamycin   . History of thyroid cancer   . Hodgkin lymphoma ([place]) 04/03/2017  . Hodgkin's disease ([place])   . Hypothyroidism   . Nonischemic cardiomyopathy ([place])    a. EF 35-40% by echo in 07/2018  . NSTEMI (non-ST elevated myocardial infarction) ([place]) 07/27/2018  . Obesity   . Osteoporosis   . Peripheral arterial disease ([place])    Stent to the left common iliac in 2006  . Thyroid carcinoma ([place]) 04/03/2017    Past Surgical History:  Procedure Laterality Date  . ABDOMINAL HYSTERECTOMY    . ANGIOPLASTY / STENTING FEMORAL Left 07/13/2005  . APPENDECTOMY    . BUNIONECTOMY Right   . CATARACT  EXTRACTION W/PHACO  05/28/2012   Procedure: CATARACT EXTRACTION PHACO AND INTRAOCULAR LENS PLACEMENT (IOC);  Surgeon: Elta Guadeloupe T. Gershon Crane, MD;  Location: [facility];  Service: Ophthalmology;  Laterality: Right;  CDE 12.86  . CATARACT EXTRACTION W/PHACO  06/04/2012   Procedure: CATARACT EXTRACTION PHACO AND INTRAOCULAR LENS PLACEMENT (IOC);  Surgeon: Elta Guadeloupe T. Gershon Crane, MD;  Location: [facility];  Service: Ophthalmology;  Laterality: Left;  CDE:15.32  . COLONOSCOPY    . LEFT HEART CATH AND CORONARY ANGIOGRAPHY N/A 07/30/2018   Procedure: LEFT HEART CATH AND CORONARY ANGIOGRAPHY;  Surgeon: Belva Crome, MD;  Location: [place] [facility] CV LAB;  Service: Cardiovascular;  Laterality: N/A;  . MASTECTOMY PARTIAL / LUMPECTOMY W/ AXILLARY LYMPHADENECTOMY Right 1987  . THYROIDECTOMY    . TONSILLECTOMY    . YAG LASER APPLICATION Right 26/83/4196   Procedure: YAG LASER APPLICATION;  Surgeon: Rutherford Guys, MD;  Location: [facility];  Service: Ophthalmology;  Laterality: Right;  . YAG LASER APPLICATION Left 10/27/2977   Procedure: YAG LASER APPLICATION;  Surgeon: Rutherford Guys, MD;  Location: [facility];  Service: Ophthalmology;  Laterality: Left;    Current Outpatient Medications  Medication Sig Dispense Refill  . aspirin 81 MG tablet Take 81 mg by mouth daily.    Marland Kitchen atorvastatin (LIPITOR) 80 MG tablet Take 1 tablet (80 mg total) by mouth daily. 90 tablet 3  . calcium-vitamin D (OSCAL WITH  D) 500-200 MG-UNIT tablet Take 1 tablet by mouth 3 (three) times daily. 90 tablet 1  . carvedilol (COREG) 3.125 MG tablet Take 1 tablet (3.125 mg total) by mouth 2 (two) times daily with a meal. 180 tablet 3  . clopidogrel (PLAVIX) 75 MG tablet Take 1 tablet by mouth once daily 30 tablet 1  . FLOVENT HFA 44 MCG/ACT inhaler Inhale 2 puffs into the lungs 2 (two) times daily.  11  . levothyroxine (SYNTHROID, LEVOTHROID) 100 MCG tablet Take 100 mcg by mouth daily.    . Multiple Vitamins-Calcium (ONE-A-DAY WOMENS PO) Take 1 tablet by mouth daily.    .  nitroGLYCERIN (NITROSTAT) 0.4 MG SL tablet Place 1 tablet (0.4 mg total) under the tongue every 5 (five) minutes x 3 doses as needed for chest pain. 25 tablet 1  . sacubitril-valsartan (ENTRESTO) 24-26 MG Take 1 tablet by mouth 2 (two) times daily. 60 tablet 11  . SSD 1 % cream APPLY A SMALL AMOUNT TOPICALLY TO THE FINGER TWICE DAILY. AVOID CONTACT WITH EYES.    . temazepam (RESTORIL) 30 MG capsule Take 30 mg by mouth at bedtime.    . vitamin C (ASCORBIC ACID) 500 MG tablet Take 500 mg by mouth daily.     No current facility-administered medications for this visit.   Allergies:  Patient has no known allergies.   ROS:  No palpitations or syncope.  Physical Exam: VS:  BP 126/84   Pulse 78   Ht 5\' 9"  (1.753 m)   Wt 200 lb (90.7 kg)   SpO2 98%   BMI 29.53 kg/m , BMI Body mass index is 29.53 kg/m.  Wt Readings from Last 3 Encounters:  03/19/20 200 lb (90.7 kg)  10/21/19 197 lb (89.4 kg)  06/12/19 192 lb (87.1 kg)    General: Elderly woman, appears comfortable at rest. HEENT: Conjunctiva and lids normal, wearing a mask. Neck: Supple, no elevated JVP or carotid bruits, no thyromegaly. Lungs: Clear to auscultation, nonlabored breathing at rest. Cardiac: Regular rate and rhythm, no S3 or significant systolic murmur, no pericardial rub. Extremities: Trace ankle edema, distal pulses 2+.  ECG:  An ECG dated 07/27/2018 was personally reviewed today and demonstrated:  Sinus rhythm with left atrial enlargement left axis.  Recent Labwork: 10/14/2019: ALT 20; AST 24; BUN 32; Creatinine, Ser 1.55; Hemoglobin 12.0; Platelets 237; Potassium 4.7; Sodium 142     Component Value Date/Time   CHOL 184 06/13/2019 0840   TRIG 49 06/13/2019 0840   HDL 71 06/13/2019 0840   CHOLHDL 2.6 06/13/2019 0840   VLDL 10 06/13/2019 0840   LDLCALC 103 (H) 06/13/2019 0840    Other Studies Reviewed Today:  Echocardiogram 08/14/2019: 1. Left ventricular ejection fraction, by visual estimation, is 30 to  35%.  The left ventricle has moderate to severely decreased function. There  is borderline left ventricular hypertrophy.  2. Elevated left ventricular end-diastolic pressure.  3. Left ventricular diastolic parameters are consistent with Grade I  diastolic dysfunction (impaired relaxation).  4. The left ventricle demonstrates global hypokinesis.  5. Global right ventricle has mildly reduced systolic function.The right  ventricular size is normal. No increase in right ventricular wall  thickness.  6. Left atrial size was mildly dilated.  7. Right atrial size was normal.  8. Moderate aortic valve annular calcification.  9. Moderate mitral annular calcification.  10. The mitral valve is degenerative. Moderate mitral valve regurgitation.  11. The tricuspid valve is grossly normal. Tricuspid valve regurgitation  is mild.  12. The aortic valve is tricuspid. Aortic valve regurgitation is not  visualized. Mild aortic valve sclerosis without stenosis.  13. The pulmonic valve was not well visualized. Pulmonic valve  regurgitation is not visualized.  14. Normal pulmonary artery systolic pressure.  15. The inferior vena cava is normal in size with greater than 50%  respiratory variability, suggesting right atrial pressure of 3 mmHg.   Assessment and Plan:  1.  Nonischemic cardiomyopathy with chronic combined heart failure.  LVEF 30 to 35% by last assessment.  She is tolerating medications well at this time including Coreg and Entresto.  Follow-up BMET in consider starting low-dose Aldactone depending on creatinine.  2.  Moderate, nonobstructive CAD.  She remains on aspirin, Plavix, and Lipitor.  3.  CKD stage IIIb, last creatinine 1.55.  Medication Adjustments/Labs and Tests Ordered: Current medicines are reviewed at length with the patient today.  Concerns regarding medicines are outlined above.   Tests Ordered: Orders Placed This Encounter  Procedures  . EKG 12-Lead    Medication  Changes: No orders of the defined types were placed in this encounter.   Disposition:  Follow up 3 to 4 months in the [place] office.  Signed, Satira Sark, MD, [facility] 03/19/2020 2:14 PM    [facility] [address] [place], [place] [address] Phone: [phone]; Fax: [phone]

## 2020-03-22 ENCOUNTER — Ambulatory Visit: Payer: Medicare HMO | Admitting: Cardiology

## 2020-03-26 ENCOUNTER — Telehealth: Payer: Self-pay

## 2020-03-26 DIAGNOSIS — Z79899 Other long term (current) drug therapy: Secondary | ICD-10-CM

## 2020-03-26 MED ORDER — SPIRONOLACTONE 25 MG PO TABS: 12.5000 mg | ORAL_TABLET | Freq: Every day | ORAL | 3 refills | Status: DC

## 2020-03-26 NOTE — Telephone Encounter (Signed)
-----   Message from Satira Sark, MD sent at 03/25/2020 12:27 PM EDT ----- Results reviewed.  I reviewed outside lab work from Dr. Karie Kirks.  Creatinine 1.82 and potassium 4.7.  I think we can still try low-dose Aldactone 12.5 mg daily.  She will need to have a BMET in 7 to 10 days after starting however.

## 2020-03-26 NOTE — Telephone Encounter (Signed)
I spoke with patient.She agrees to start Aldactone 12.5 mg daily. I will mail lab slip to her

## 2020-04-05 ENCOUNTER — Other Ambulatory Visit (HOSPITAL_COMMUNITY)
Admission: RE | Admit: 2020-04-05 | Discharge: 2020-04-05 | Disposition: A | Discharge status: Home / Self Care | Payer: Medicare HMO | Source: Ambulatory Visit | Attending: Cardiology | Admitting: Cardiology

## 2020-04-05 DIAGNOSIS — Z79899 Other long term (current) drug therapy: Secondary | ICD-10-CM | POA: Insufficient documentation

## 2020-04-05 LAB — BASIC METABOLIC PANEL
Anion gap: 11 (ref 5–15)
BUN: 31 mg/dL — ABNORMAL HIGH (ref 8–23)
CO2: 24 mmol/L (ref 22–32)
Calcium: 6.8 mg/dL — ABNORMAL LOW (ref 8.9–10.3)
Chloride: 104 mmol/L (ref 98–111)
Creatinine, Ser: 1.44 mg/dL — ABNORMAL HIGH (ref 0.44–1.00)
GFR calc Af Amer: 38 mL/min — ABNORMAL LOW (ref >60)
GFR calc non Af Amer: 33 mL/min — ABNORMAL LOW (ref >60)
Glucose, Bld: 99 mg/dL (ref 70–99)
Potassium: 4.4 mmol/L (ref 3.5–5.1)
Sodium: 139 mmol/L (ref 135–145)

## 2020-06-20 ENCOUNTER — Other Ambulatory Visit: Payer: Self-pay | Admitting: Cardiology

## 2020-06-23 ENCOUNTER — Other Ambulatory Visit: Payer: Self-pay | Admitting: Cardiology

## 2020-07-27 ENCOUNTER — Ambulatory Visit: Payer: Medicare HMO | Admitting: Cardiology

## 2020-07-27 NOTE — Progress Notes (Deleted)
Cardiology Office Note  Date: 07/27/2020   ID: ADI [name], DOB [birth year], MRN [id number]  PCP:  Lemmie Evens, MD  Cardiologist:  Rozann Lesches, MD Electrophysiologist:  None   No chief complaint on file.   History of Present Illness: [name] is an 86 y.o. female last seen in June.  Low-dose Aldactone was added since our last encounter, lab work is reviewed below.  Past Medical History:  Diagnosis Date  . Asthma   . Breast cancer, right breast ([place])   . CKD (chronic kidney disease), stage III   . Coronary atherosclerosis    a. cardiac catheterization in 2004 with 60% mid LAD b. nonobstructive disease by cath in 07/2018 at time of NSTEMI  . DVT (deep venous thrombosis) ([place])   . Essential hypertension   . History of breast cancer 1987   Right-sided, mastectomy with axillary node dissection and Adriamycin   . History of thyroid cancer   . Hodgkin lymphoma ([place]) 04/03/2017  . Hodgkin's disease ([place])   . Hypothyroidism   . Nonischemic cardiomyopathy ([place])    a. EF 35-40% by echo in 07/2018  . NSTEMI (non-ST elevated myocardial infarction) ([place]) 07/27/2018  . Obesity   . Osteoporosis   . Peripheral arterial disease ([place])    Stent to the left common iliac in 2006  . Thyroid carcinoma ([place]) 04/03/2017    Past Surgical History:  Procedure Laterality Date  . ABDOMINAL HYSTERECTOMY    . ANGIOPLASTY / STENTING FEMORAL Left 07/13/2005  . APPENDECTOMY    . BUNIONECTOMY Right   . CATARACT EXTRACTION W/PHACO  05/28/2012   Procedure: CATARACT EXTRACTION PHACO AND INTRAOCULAR LENS PLACEMENT (IOC);  Surgeon: Elta Guadeloupe T. Gershon Crane, MD;  Location: [facility];  Service: Ophthalmology;  Laterality: Right;  CDE 12.86  . CATARACT EXTRACTION W/PHACO  06/04/2012   Procedure: CATARACT EXTRACTION PHACO AND INTRAOCULAR LENS PLACEMENT (IOC);  Surgeon: Elta Guadeloupe T. Gershon Crane, MD;  Location: [facility];  Service: Ophthalmology;  Laterality: Left;  CDE:15.32  . COLONOSCOPY    . LEFT HEART CATH  AND CORONARY ANGIOGRAPHY N/A 07/30/2018   Procedure: LEFT HEART CATH AND CORONARY ANGIOGRAPHY;  Surgeon: Belva Crome, MD;  Location: [facility];  Service: Cardiovascular;  Laterality: N/A;  . MASTECTOMY PARTIAL / LUMPECTOMY W/ AXILLARY LYMPHADENECTOMY Right 1987  . THYROIDECTOMY    . TONSILLECTOMY    . YAG LASER APPLICATION Right 76/16/0737   Procedure: YAG LASER APPLICATION;  Surgeon: Rutherford Guys, MD;  Location: [facility];  Service: Ophthalmology;  Laterality: Right;  . YAG LASER APPLICATION Left 1/0/6269   Procedure: YAG LASER APPLICATION;  Surgeon: Rutherford Guys, MD;  Location: [facility];  Service: Ophthalmology;  Laterality: Left;    Current Outpatient Medications  Medication Sig Dispense Refill  . aspirin 81 MG tablet Take 81 mg by mouth daily.    Marland Kitchen atorvastatin (LIPITOR) 80 MG tablet Take 1 tablet by mouth once daily 90 tablet 1  . calcium-vitamin D (OSCAL WITH D) 500-200 MG-UNIT tablet Take 1 tablet by mouth 3 (three) times daily. 90 tablet 1  . carvedilol (COREG) 3.125 MG tablet Take 1 tablet (3.125 mg total) by mouth 2 (two) times daily with a meal. 180 tablet 3  . clopidogrel (PLAVIX) 75 MG tablet Take 1 tablet by mouth once daily 90 tablet 1  . FLOVENT HFA 44 MCG/ACT inhaler Inhale 2 puffs into the lungs 2 (two) times daily.  11  . levothyroxine (SYNTHROID, LEVOTHROID) 100 MCG tablet Take 100 mcg by  mouth daily.    . Multiple Vitamins-Calcium (ONE-A-DAY WOMENS PO) Take 1 tablet by mouth daily.    . nitroGLYCERIN (NITROSTAT) 0.4 MG SL tablet Place 1 tablet (0.4 mg total) under the tongue every 5 (five) minutes x 3 doses as needed for chest pain. 25 tablet 1  . sacubitril-valsartan (ENTRESTO) 24-26 MG Take 1 tablet by mouth 2 (two) times daily. 60 tablet 11  . spironolactone (ALDACTONE) 25 MG tablet Take 0.5 tablets (12.5 mg total) by mouth daily. 45 tablet 3  . SSD 1 % cream APPLY A SMALL AMOUNT TOPICALLY TO THE FINGER TWICE DAILY. AVOID CONTACT WITH EYES.    . temazepam  (RESTORIL) 30 MG capsule Take 30 mg by mouth at bedtime.    . vitamin C (ASCORBIC ACID) 500 MG tablet Take 500 mg by mouth daily.     No current facility-administered medications for this visit.   Allergies:  Patient has no known allergies.   Social History: The patient  reports that she has never smoked. She has never used smokeless tobacco. She reports that she does not drink alcohol and does not use drugs.   Family History: The patient's family history includes Heart attack in her father; Lung cancer in her mother.   ROS:  Please see the history of present illness. Otherwise, complete review of systems is positive for {NONE DEFAULTED:18576::"none"}.  All other systems are reviewed and negative.   Physical Exam: VS:  There were no vitals taken for this visit., BMI There is no height or weight on file to calculate BMI.  Wt Readings from Last 3 Encounters:  03/19/20 200 lb (90.7 kg)  10/21/19 197 lb (89.4 kg)  06/12/19 192 lb (87.1 kg)    General: Patient appears comfortable at rest. HEENT: Conjunctiva and lids normal, oropharynx clear with moist mucosa. Neck: Supple, no elevated JVP or carotid bruits, no thyromegaly. Lungs: Clear to auscultation, nonlabored breathing at rest. Cardiac: Regular rate and rhythm, no S3 or significant systolic murmur, no pericardial rub. Abdomen: Soft, nontender, no hepatomegaly, bowel sounds present, no guarding or rebound. Extremities: No pitting edema, distal pulses 2+. Skin: Warm and dry. Musculoskeletal: No kyphosis. Neuropsychiatric: Alert and oriented x3, affect grossly appropriate.  ECG:  An ECG dated 03/19/2020 was personally reviewed today and demonstrated:  Sinus rhythm with decreased R wave progression and nonspecific T wave changes.  Recent Labwork: 10/14/2019: ALT 20; AST 24; Hemoglobin 12.0; Platelets 237 04/05/2020: BUN 31; Creatinine, Ser 1.44; Potassium 4.4; Sodium 139     Component Value Date/Time   CHOL 184 06/13/2019 0840   TRIG  49 06/13/2019 0840   HDL 71 06/13/2019 0840   CHOLHDL 2.6 06/13/2019 0840   VLDL 10 06/13/2019 0840   LDLCALC 103 (H) 06/13/2019 0840    Other Studies Reviewed Today:  Echocardiogram 08/14/2019: 1. Left ventricular ejection fraction, by visual estimation, is 30 to  35%. The left ventricle has moderate to severely decreased function. There  is borderline left ventricular hypertrophy.  2. Elevated left ventricular end-diastolic pressure.  3. Left ventricular diastolic parameters are consistent with Grade I  diastolic dysfunction (impaired relaxation).  4. The left ventricle demonstrates global hypokinesis.  5. Global right ventricle has mildly reduced systolic function.The right  ventricular size is normal. No increase in right ventricular wall  thickness.  6. Left atrial size was mildly dilated.  7. Right atrial size was normal.  8. Moderate aortic valve annular calcification.  9. Moderate mitral annular calcification.  10. The mitral valve is degenerative.  Moderate mitral valve regurgitation.  11. The tricuspid valve is grossly normal. Tricuspid valve regurgitation  is mild.  12. The aortic valve is tricuspid. Aortic valve regurgitation is not  visualized. Mild aortic valve sclerosis without stenosis.  13. The pulmonic valve was not well visualized. Pulmonic valve  regurgitation is not visualized.  14. Normal pulmonary artery systolic pressure.  15. The inferior vena cava is normal in size with greater than 50%  respiratory variability, suggesting right atrial pressure of 3 mmHg.   Assessment and Plan:    Medication Adjustments/Labs and Tests Ordered: Current medicines are reviewed at length with the patient today.  Concerns regarding medicines are outlined above.   Tests Ordered: No orders of the defined types were placed in this encounter.   Medication Changes: No orders of the defined types were placed in this encounter.   Disposition:  Follow up {follow  up:15908}  Signed, Satira Sark, MD, [facility] 07/27/2020 12:11 PM    [facility] [place], [place], [place] [address] Phone: [phone]; Fax: [phone]

## 2020-07-30 ENCOUNTER — Other Ambulatory Visit: Payer: Self-pay | Admitting: Nephrology

## 2020-07-30 ENCOUNTER — Other Ambulatory Visit (HOSPITAL_COMMUNITY): Payer: Self-pay | Admitting: Nephrology

## 2020-07-30 DIAGNOSIS — N189 Chronic kidney disease, unspecified: Secondary | ICD-10-CM

## 2020-07-30 DIAGNOSIS — I10 Essential (primary) hypertension: Secondary | ICD-10-CM

## 2020-08-05 ENCOUNTER — Other Ambulatory Visit: Payer: Self-pay

## 2020-08-05 ENCOUNTER — Ambulatory Visit (HOSPITAL_COMMUNITY)
Admission: RE | Admit: 2020-08-05 | Discharge: 2020-08-05 | Disposition: A | Discharge status: Home / Self Care | Payer: Medicare HMO | Source: Ambulatory Visit | Attending: Nephrology | Admitting: Nephrology

## 2020-08-05 DIAGNOSIS — I10 Essential (primary) hypertension: Secondary | ICD-10-CM | POA: Insufficient documentation

## 2020-08-05 DIAGNOSIS — N189 Chronic kidney disease, unspecified: Secondary | ICD-10-CM | POA: Diagnosis present

## 2020-09-06 ENCOUNTER — Telehealth: Payer: Self-pay | Admitting: Cardiology

## 2020-09-06 NOTE — Telephone Encounter (Signed)
Patient called stating that she will be bringing papers from [employer] to the office for someone to complete.

## 2020-09-13 ENCOUNTER — Other Ambulatory Visit (HOSPITAL_COMMUNITY): Payer: Self-pay | Admitting: Family Medicine

## 2020-09-13 ENCOUNTER — Other Ambulatory Visit: Payer: Self-pay

## 2020-09-13 ENCOUNTER — Ambulatory Visit (HOSPITAL_COMMUNITY)
Admission: RE | Admit: 2020-09-13 | Discharge: 2020-09-13 | Disposition: A | Discharge status: Home / Self Care | Payer: Medicare HMO | Source: Ambulatory Visit | Attending: Family Medicine | Admitting: Family Medicine

## 2020-09-13 ENCOUNTER — Other Ambulatory Visit (HOSPITAL_COMMUNITY)
Admission: RE | Admit: 2020-09-13 | Discharge: 2020-09-13 | Disposition: A | Discharge status: Home / Self Care | Payer: Medicare HMO | Source: Ambulatory Visit | Attending: Family Medicine | Admitting: Family Medicine

## 2020-09-13 DIAGNOSIS — R0602 Shortness of breath: Secondary | ICD-10-CM | POA: Insufficient documentation

## 2020-09-13 DIAGNOSIS — R062 Wheezing: Secondary | ICD-10-CM | POA: Insufficient documentation

## 2020-09-13 LAB — CBC
HCT: 36.6 % (ref 36.0–46.0)
Hemoglobin: 11.7 g/dL — ABNORMAL LOW (ref 12.0–15.0)
MCH: 29.1 pg (ref 26.0–34.0)
MCHC: 32 g/dL (ref 30.0–36.0)
MCV: 91 fL (ref 80.0–100.0)
Platelets: 215 10*3/uL (ref 150–400)
RBC: 4.02 MIL/uL (ref 3.87–5.11)
RDW: 15.9 % — ABNORMAL HIGH (ref 11.5–15.5)
WBC: 5.7 10*3/uL (ref 4.0–10.5)
nRBC: 0 % (ref 0.0–0.2)

## 2020-09-13 LAB — BRAIN NATRIURETIC PEPTIDE: B Natriuretic Peptide: 491 pg/mL — ABNORMAL HIGH (ref 0.0–100.0)

## 2020-09-13 LAB — BASIC METABOLIC PANEL
Anion gap: 11 (ref 5–15)
BUN: 24 mg/dL — ABNORMAL HIGH (ref 8–23)
CO2: 25 mmol/L (ref 22–32)
Calcium: 8 mg/dL — ABNORMAL LOW (ref 8.9–10.3)
Chloride: 102 mmol/L (ref 98–111)
Creatinine, Ser: 1.57 mg/dL — ABNORMAL HIGH (ref 0.44–1.00)
GFR, Estimated: 32 mL/min — ABNORMAL LOW (ref >60)
Glucose, Bld: 96 mg/dL (ref 70–99)
Potassium: 4.6 mmol/L (ref 3.5–5.1)
Sodium: 138 mmol/L (ref 135–145)

## 2020-11-03 ENCOUNTER — Other Ambulatory Visit (HOSPITAL_COMMUNITY): Payer: Self-pay | Admitting: Hematology

## 2020-11-03 DIAGNOSIS — R921 Mammographic calcification found on diagnostic imaging of breast: Secondary | ICD-10-CM

## 2020-11-08 ENCOUNTER — Other Ambulatory Visit (HOSPITAL_COMMUNITY): Payer: Self-pay

## 2020-11-08 DIAGNOSIS — Z853 Personal history of malignant neoplasm of breast: Secondary | ICD-10-CM

## 2020-11-09 ENCOUNTER — Ambulatory Visit (HOSPITAL_COMMUNITY)
Admission: RE | Admit: 2020-11-09 | Discharge: 2020-11-09 | Disposition: A | Discharge status: Home / Self Care | Payer: Medicare HMO | Source: Ambulatory Visit | Attending: Hematology | Admitting: Hematology

## 2020-11-09 ENCOUNTER — Encounter (HOSPITAL_COMMUNITY): Payer: Self-pay

## 2020-11-09 ENCOUNTER — Other Ambulatory Visit: Payer: Self-pay

## 2020-11-09 ENCOUNTER — Inpatient Hospital Stay (HOSPITAL_COMMUNITY): Payer: Medicare HMO | Attending: Hematology

## 2020-11-09 ENCOUNTER — Inpatient Hospital Stay (HOSPITAL_COMMUNITY): Admission: RE | Admit: 2020-11-09 | Payer: Medicare HMO | Source: Ambulatory Visit

## 2020-11-09 DIAGNOSIS — R921 Mammographic calcification found on diagnostic imaging of breast: Secondary | ICD-10-CM

## 2020-11-09 DIAGNOSIS — Z85850 Personal history of malignant neoplasm of thyroid: Secondary | ICD-10-CM | POA: Diagnosis not present

## 2020-11-09 DIAGNOSIS — N189 Chronic kidney disease, unspecified: Secondary | ICD-10-CM | POA: Insufficient documentation

## 2020-11-09 DIAGNOSIS — Z8571 Personal history of Hodgkin lymphoma: Secondary | ICD-10-CM | POA: Insufficient documentation

## 2020-11-09 DIAGNOSIS — Z923 Personal history of irradiation: Secondary | ICD-10-CM | POA: Insufficient documentation

## 2020-11-09 DIAGNOSIS — Z853 Personal history of malignant neoplasm of breast: Secondary | ICD-10-CM

## 2020-11-09 LAB — CBC WITH DIFFERENTIAL/PLATELET
Abs Immature Granulocytes: 0.01 10*3/uL (ref 0.00–0.07)
Basophils Absolute: 0 10*3/uL (ref 0.0–0.1)
Basophils Relative: 1 %
Eosinophils Absolute: 0.1 10*3/uL (ref 0.0–0.5)
Eosinophils Relative: 2 %
HCT: 38.6 % (ref 36.0–46.0)
Hemoglobin: 12.5 g/dL (ref 12.0–15.0)
Immature Granulocytes: 0 %
Lymphocytes Relative: 34 %
Lymphs Abs: 1.8 10*3/uL (ref 0.7–4.0)
MCH: 29 pg (ref 26.0–34.0)
MCHC: 32.4 g/dL (ref 30.0–36.0)
MCV: 89.6 fL (ref 80.0–100.0)
Monocytes Absolute: 0.5 10*3/uL (ref 0.1–1.0)
Monocytes Relative: 10 %
Neutro Abs: 2.9 10*3/uL (ref 1.7–7.7)
Neutrophils Relative %: 53 %
Platelets: 240 10*3/uL (ref 150–400)
RBC: 4.31 MIL/uL (ref 3.87–5.11)
RDW: 15.5 % (ref 11.5–15.5)
WBC: 5.4 10*3/uL (ref 4.0–10.5)
nRBC: 0 % (ref 0.0–0.2)

## 2020-11-09 LAB — COMPREHENSIVE METABOLIC PANEL
ALT: 15 U/L (ref 0–44)
AST: 19 U/L (ref 15–41)
Albumin: 3.9 g/dL (ref 3.5–5.0)
Alkaline Phosphatase: 47 U/L (ref 38–126)
Anion gap: 9 (ref 5–15)
BUN: 32 mg/dL — ABNORMAL HIGH (ref 8–23)
CO2: 28 mmol/L (ref 22–32)
Calcium: 9.3 mg/dL (ref 8.9–10.3)
Chloride: 98 mmol/L (ref 98–111)
Creatinine, Ser: 1.93 mg/dL — ABNORMAL HIGH (ref 0.44–1.00)
GFR, Estimated: 25 mL/min — ABNORMAL LOW (ref >60)
Glucose, Bld: 104 mg/dL — ABNORMAL HIGH (ref 70–99)
Potassium: 4.5 mmol/L (ref 3.5–5.1)
Sodium: 135 mmol/L (ref 135–145)
Total Bilirubin: 0.8 mg/dL (ref 0.3–1.2)
Total Protein: 7.6 g/dL (ref 6.5–8.1)

## 2020-11-11 ENCOUNTER — Other Ambulatory Visit: Payer: Self-pay | Admitting: Hematology

## 2020-11-11 ENCOUNTER — Other Ambulatory Visit: Payer: Self-pay

## 2020-11-11 ENCOUNTER — Inpatient Hospital Stay (HOSPITAL_BASED_OUTPATIENT_CLINIC_OR_DEPARTMENT_OTHER): Payer: Medicare HMO | Admitting: Hematology

## 2020-11-11 VITALS — BP 123/70 | HR 80 | Temp 96.9°F | Resp 18 | Wt 183.4 lb

## 2020-11-11 DIAGNOSIS — R928 Other abnormal and inconclusive findings on diagnostic imaging of breast: Secondary | ICD-10-CM

## 2020-11-11 DIAGNOSIS — C8100 Nodular lymphocyte predominant Hodgkin lymphoma, unspecified site: Secondary | ICD-10-CM | POA: Diagnosis not present

## 2020-11-11 DIAGNOSIS — Z853 Personal history of malignant neoplasm of breast: Secondary | ICD-10-CM

## 2020-11-11 DIAGNOSIS — Z8571 Personal history of Hodgkin lymphoma: Secondary | ICD-10-CM | POA: Diagnosis not present

## 2020-11-11 NOTE — Patient Instructions (Signed)
[place] at [facility] Discharge Instructions  You were seen today by Dr. Delton Coombes. He went over your recent results and scans. You will be referred to [facility] to have a biopsy of the calcifications in your left breast. Dr. Delton Coombes will see you back in 2 months for follow up.   Thank you for choosing [place] at [facility] to provide your oncology and hematology care.  To afford each patient quality time with our provider, please arrive at least 15 minutes before your scheduled appointment time.   If you have a lab appointment with the [facility] please come in thru the Main Entrance and check in at the main information desk  You need to re-schedule your appointment should you arrive 10 or more minutes late.  We strive to give you quality time with our providers, and arriving late affects you and other patients whose appointments are after yours.  Also, if you no show three or more times for appointments you may be dismissed from the clinic at the providers discretion.     Again, thank you for choosing [facility].  Our hope is that these requests will decrease the amount of time that you wait before being seen by our physicians.       _____________________________________________________________  Should you have questions after your visit to [facility], please contact our office at [phone] between the hours of 8:00 a.m. and 4:30 p.m.  Voicemails left after 4:00 p.m. will not be returned until the following business day.  For prescription refill requests, have your pharmacy contact our office and allow 72 hours.    Cancer Center Support Programs:   > Cancer Support Group  2nd Tuesday of the month 1pm-2pm, Journey Room

## 2020-11-11 NOTE — Progress Notes (Signed)
[facility] [address], [place] [address]   Patient Care Team: Lemmie Evens, MD as PCP - General (Family Medicine) Satira Sark, MD as PCP - Cardiology (Cardiology)  SUMMARY OF ONCOLOGIC HISTORY: Oncology History   No history exists.    CHIEF COMPLIANT: Follow-up for history of right breast cancer and Hodgkin's lymphoma   INTERVAL HISTORY: [name] is a 87 y.o. female here today for follow up of her history of right breast cancer and Hodgkin's lymphoma. She was last contacted via telephone on 11/06/2019.   Today she reports feeling well. She complains of having sleeping issues even after taking melatonin and Restoril, but denies having F/C or night sweats.  She will see Dr. Theador Hawthorne on 02/25.   REVIEW OF SYSTEMS:   Review of Systems  Constitutional: Positive for fatigue (75%). Negative for appetite change, chills, diaphoresis and fever.  Psychiatric/Behavioral: Positive for sleep disturbance.  All other systems reviewed and are negative.   I have reviewed the past medical history, past surgical history, social history and family history with the patient and they are unchanged from previous note.   ALLERGIES:   has No Known Allergies.   MEDICATIONS:  Current Outpatient Medications  Medication Sig Dispense Refill  . aspirin 81 MG tablet Take 81 mg by mouth daily.    Marland Kitchen atorvastatin (LIPITOR) 80 MG tablet Take 1 tablet by mouth once daily 90 tablet 1  . calcium-vitamin D (OSCAL WITH D) 500-200 MG-UNIT tablet Take 1 tablet by mouth 3 (three) times daily. 90 tablet 1  . carvedilol (COREG) 3.125 MG tablet Take 1 tablet (3.125 mg total) by mouth 2 (two) times daily with a meal. 180 tablet 3  . clopidogrel (PLAVIX) 75 MG tablet Take 1 tablet by mouth once daily 90 tablet 1  . FLOVENT HFA 44 MCG/ACT inhaler Inhale 2 puffs into the lungs 2 (two) times daily.  11  . furosemide (LASIX) 20 MG tablet Take 20 mg by mouth every morning.     Marland Kitchen levothyroxine (SYNTHROID, LEVOTHROID) 100 MCG tablet Take 100 mcg by mouth daily.    . Multiple Vitamins-Calcium (ONE-A-DAY WOMENS PO) Take 1 tablet by mouth daily.    . nitroGLYCERIN (NITROSTAT) 0.4 MG SL tablet Place 1 tablet (0.4 mg total) under the tongue every 5 (five) minutes x 3 doses as needed for chest pain. 25 tablet 1  . sacubitril-valsartan (ENTRESTO) 24-26 MG Take 1 tablet by mouth 2 (two) times daily. 60 tablet 11  . SSD 1 % cream APPLY A SMALL AMOUNT TOPICALLY TO THE FINGER TWICE DAILY. AVOID CONTACT WITH EYES.    . temazepam (RESTORIL) 30 MG capsule Take 30 mg by mouth at bedtime.    . vitamin C (ASCORBIC ACID) 500 MG tablet Take 500 mg by mouth daily.    Marland Kitchen spironolactone (ALDACTONE) 25 MG tablet Take 0.5 tablets (12.5 mg total) by mouth daily. 45 tablet 3   No current facility-administered medications for this visit.     PHYSICAL EXAMINATION: Performance status (ECOG): 1 - Symptomatic but completely ambulatory  Vitals:   11/11/20 1406  BP: 123/70  Pulse: 80  Resp: 18  Temp: (!) 96.9 F (36.1 C)  SpO2: 100%   Wt Readings from Last 3 Encounters:  11/11/20 183 lb 6.4 oz (83.2 kg)  03/19/20 200 lb (90.7 kg)  10/21/19 197 lb (89.4 kg)   Physical Exam Vitals reviewed.  Constitutional:      Appearance: Normal appearance.  Cardiovascular:  Rate and Rhythm: Normal rate and regular rhythm.     Pulses: Normal pulses.     Heart sounds: Normal heart sounds.  Pulmonary:     Effort: Pulmonary effort is normal.     Breath sounds: Normal breath sounds.  Chest:  Breasts:     Right: No axillary adenopathy or supraclavicular adenopathy.     Left: No axillary adenopathy or supraclavicular adenopathy.    Abdominal:     Palpations: Abdomen is soft. There is no hepatomegaly, splenomegaly or mass.     Tenderness: There is no abdominal tenderness.     Hernia: No hernia is present.  Musculoskeletal:     Right lower leg: No edema.     Left lower leg: No edema.   Lymphadenopathy:     Cervical: No cervical adenopathy.     Upper Body:     Right upper body: No supraclavicular, axillary or pectoral adenopathy.     Left upper body: No supraclavicular, axillary or pectoral adenopathy.  Neurological:     General: No focal deficit present.     Mental Status: She is alert and oriented to person, place, and time.  Psychiatric:        Mood and Affect: Mood normal.        Behavior: Behavior normal.     Breast Exam Chaperone: Milinda Antis, MD     LABORATORY DATA:  I have reviewed the data as listed CMP Latest Ref Rng & Units 11/09/2020 09/13/2020 04/05/2020  Glucose 70 - 99 mg/dL 104(H) 96 99  BUN 8 - 23 mg/dL 32(H) 24(H) 31(H)  Creatinine 0.44 - 1.00 mg/dL 1.93(H) 1.57(H) 1.44(H)  Sodium 135 - 145 mmol/L 135 138 139  Potassium 3.5 - 5.1 mmol/L 4.5 4.6 4.4  Chloride 98 - 111 mmol/L 98 102 104  CO2 22 - 32 mmol/L 28 25 24   Calcium 8.9 - 10.3 mg/dL 9.3 8.0(L) 6.8(L)  Total Protein 6.5 - 8.1 g/dL 7.6 - -  Total Bilirubin 0.3 - 1.2 mg/dL 0.8 - -  Alkaline Phos 38 - 126 U/L 47 - -  AST 15 - 41 U/L 19 - -  ALT 0 - 44 U/L 15 - -   No results found for: [id number] Lab Results  Component Value Date   WBC 5.4 11/09/2020   HGB 12.5 11/09/2020   HCT 38.6 11/09/2020   MCV 89.6 11/09/2020   PLT 240 11/09/2020   NEUTROABS 2.9 11/09/2020    ASSESSMENT:  1.  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  2.  Stage II right breast cancer: -Diagnosed in 1987, status post right lumpectomy and axillary lymph node dissection at [facility].  One lymph node was positive. -Adjuvant radiation therapy and tamoxifen.   3.  Thyroid cancer: -Status post near total thyroidectomy.   PLAN:  1.  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 -Reviewed labs which did not indicate any evidence of recurrence.  2.  Stage II right breast  cancer: -Reviewed results of mammogram from 11/09/2020 which showed slight increase in calcifications in the right breast measuring 2.1 x 1.5 x 1.8 cm. -I have strongly recommended biopsy.  We will schedule it with the breast center. -RTC after biopsy/surgery.  3.  CKD: -Creatinine today slightly high at 1.9.  Continue follow-up with Dr. Theador Hawthorne.     No orders of the defined types were placed in this encounter.  The patient has a  good understanding of the overall plan. she agrees with it. she will call with any problems that may develop before the next visit here.    Derek Jack, MD [place] [phone]   I, Milinda Antis, am acting as a scribe for Dr. Sanda Linger.  I, Derek Jack MD, have reviewed the above documentation for accuracy and completeness, and I agree with the above.

## 2020-11-18 ENCOUNTER — Other Ambulatory Visit: Payer: Self-pay

## 2020-11-18 ENCOUNTER — Ambulatory Visit
Admission: RE | Admit: 2020-11-18 | Discharge: 2020-11-18 | Disposition: A | Discharge status: Home / Self Care | Payer: Medicare HMO | Source: Ambulatory Visit | Attending: Hematology | Admitting: Hematology

## 2020-11-18 DIAGNOSIS — R928 Other abnormal and inconclusive findings on diagnostic imaging of breast: Secondary | ICD-10-CM

## 2020-11-19 ENCOUNTER — Other Ambulatory Visit (HOSPITAL_COMMUNITY): Payer: Self-pay | Admitting: Nephrology

## 2020-11-19 ENCOUNTER — Other Ambulatory Visit: Payer: Self-pay | Admitting: Nephrology

## 2020-11-19 DIAGNOSIS — N170 Acute kidney failure with tubular necrosis: Secondary | ICD-10-CM

## 2020-11-19 DIAGNOSIS — N1832 Chronic kidney disease, stage 3b: Secondary | ICD-10-CM

## 2020-11-19 DIAGNOSIS — I5042 Chronic combined systolic (congestive) and diastolic (congestive) heart failure: Secondary | ICD-10-CM

## 2020-11-19 DIAGNOSIS — R809 Proteinuria, unspecified: Secondary | ICD-10-CM

## 2020-11-22 ENCOUNTER — Telehealth: Payer: Self-pay | Admitting: Cardiology

## 2020-11-22 NOTE — Telephone Encounter (Signed)
I spoke with patient and she has no idea why Entresto was stopped. Dr.Bhutani's office is closed on Monday and I requested office notes.

## 2020-11-22 NOTE — Telephone Encounter (Signed)
New message     Patient states she went to the [facility]ney doctor on Friday and they stopped her [name] and told her not to take it anymore, what should she take in its place?

## 2020-11-22 NOTE — Telephone Encounter (Signed)
I spoke with patient.I explained why Dr.Bhutani stopped Entresto and that we will not replace Entresto with anything at this point. [name] is very upset as she was just told she has cancer again.

## 2020-11-22 NOTE — Telephone Encounter (Signed)
Please take a look at the chart.  This is what Dr. Toya Smothers note says:  "Patient [name] is has worsened, I informed the patient that his creatinine has gone up to now From 1.7 to now nearly 2.2. I did discuss the patient that I will have to hold her [name], recheck her kidney ultrasound to make sure there is no obstruction, repeat her blood test.Patient voiced understanding"  Would not replace Entresto with anything as yet given worsening renal function.  She will have follow-up lab work with Dr. Theador Hawthorne and we can go from there.

## 2020-11-26 ENCOUNTER — Other Ambulatory Visit (HOSPITAL_COMMUNITY)
Admission: RE | Admit: 2020-11-26 | Discharge: 2020-11-26 | Disposition: A | Discharge status: Home / Self Care | Payer: Medicare HMO | Source: Ambulatory Visit | Attending: Nephrology | Admitting: Nephrology

## 2020-11-26 ENCOUNTER — Ambulatory Visit (HOSPITAL_COMMUNITY)
Admission: RE | Admit: 2020-11-26 | Discharge: 2020-11-26 | Disposition: A | Discharge status: Home / Self Care | Payer: Medicare HMO | Source: Ambulatory Visit | Attending: Nephrology | Admitting: Nephrology

## 2020-11-26 ENCOUNTER — Other Ambulatory Visit: Payer: Self-pay

## 2020-11-26 DIAGNOSIS — R809 Proteinuria, unspecified: Secondary | ICD-10-CM

## 2020-11-26 DIAGNOSIS — N170 Acute kidney failure with tubular necrosis: Secondary | ICD-10-CM

## 2020-11-26 DIAGNOSIS — I5042 Chronic combined systolic (congestive) and diastolic (congestive) heart failure: Secondary | ICD-10-CM | POA: Diagnosis present

## 2020-11-26 DIAGNOSIS — N1832 Chronic kidney disease, stage 3b: Secondary | ICD-10-CM | POA: Insufficient documentation

## 2020-11-26 LAB — RENAL FUNCTION PANEL
Albumin: 3.9 g/dL (ref 3.5–5.0)
Anion gap: 13 (ref 5–15)
BUN: 35 mg/dL — ABNORMAL HIGH (ref 8–23)
CO2: 25 mmol/L (ref 22–32)
Calcium: 9.3 mg/dL (ref 8.9–10.3)
Chloride: 101 mmol/L (ref 98–111)
Creatinine, Ser: 2.07 mg/dL — ABNORMAL HIGH (ref 0.44–1.00)
GFR, Estimated: 23 mL/min — ABNORMAL LOW (ref >60)
Glucose, Bld: 92 mg/dL (ref 70–99)
Phosphorus: 4.9 mg/dL — ABNORMAL HIGH (ref 2.5–4.6)
Potassium: 4.2 mmol/L (ref 3.5–5.1)
Sodium: 139 mmol/L (ref 135–145)

## 2020-11-30 ENCOUNTER — Encounter: Payer: Self-pay | Admitting: General Surgery

## 2020-11-30 ENCOUNTER — Other Ambulatory Visit: Payer: Self-pay

## 2020-11-30 ENCOUNTER — Ambulatory Visit (INDEPENDENT_AMBULATORY_CARE_PROVIDER_SITE_OTHER): Payer: Medicare HMO | Admitting: General Surgery

## 2020-11-30 VITALS — BP 127/80 | HR 78 | Temp 97.4°F | Resp 14 | Ht 69.0 in | Wt 188.0 lb

## 2020-11-30 DIAGNOSIS — C50911 Malignant neoplasm of unspecified site of right female breast: Secondary | ICD-10-CM | POA: Diagnosis not present

## 2020-12-01 ENCOUNTER — Inpatient Hospital Stay (HOSPITAL_COMMUNITY): Payer: Medicare HMO | Attending: Hematology | Admitting: Hematology

## 2020-12-01 DIAGNOSIS — Z853 Personal history of malignant neoplasm of breast: Secondary | ICD-10-CM

## 2020-12-01 NOTE — Progress Notes (Signed)
[name]; [id number]; 26-Oct-1929   HPI Patient is an 87 year old black female who was referred to my care by Dr. Delton Coombes for evaluation and treatment of a right breast carcinoma.  This was found recently on mammogram and biopsy proven to be invasive ductal carcinoma.  Patient is status post a right partial mastectomy with axillary dissection in 1987.  She did receive radiation and tamoxifen treatment.  She states she did not feel the cancer.  She is otherwise in her usual state of health.  She does have multiple other medical issues including coronary artery disease, cardiomyopathy, chronic kidney disease, and history of lymphoma.  She primarily lives alone and is able to take care of herself.  She denies any pain. Past Medical History:  Diagnosis Date  . Asthma   . Breast cancer, right breast ([place])   . CKD (chronic kidney disease), stage III ([place])   . Coronary atherosclerosis    a. cardiac catheterization in 2004 with 60% mid LAD b. nonobstructive disease by cath in 07/2018 at time of NSTEMI  . DVT (deep venous thrombosis) ([place])   . Essential hypertension   . History of breast cancer 1987   Right-sided, mastectomy with axillary node dissection and Adriamycin   . History of thyroid cancer   . Hodgkin lymphoma ([place]) 04/03/2017  . Hodgkin's disease ([place])   . Hypothyroidism   . Nonischemic cardiomyopathy ([place])    a. EF 35-40% by echo in 07/2018  . NSTEMI (non-ST elevated myocardial infarction) ([place]) 07/27/2018  . Obesity   . Osteoporosis   . Peripheral arterial disease ([place])    Stent to the left common iliac in 2006  . Thyroid carcinoma ([place]) 04/03/2017    Past Surgical History:  Procedure Laterality Date  . ABDOMINAL HYSTERECTOMY    . ANGIOPLASTY / STENTING FEMORAL Left 07/13/2005  . APPENDECTOMY    . BUNIONECTOMY Right   . CATARACT EXTRACTION W/PHACO  05/28/2012   Procedure: CATARACT EXTRACTION PHACO AND INTRAOCULAR LENS PLACEMENT (IOC);  Surgeon: Elta Guadeloupe T. Gershon Crane, MD;   Location: [facility];  Service: Ophthalmology;  Laterality: Right;  CDE 12.86  . CATARACT EXTRACTION W/PHACO  06/04/2012   Procedure: CATARACT EXTRACTION PHACO AND INTRAOCULAR LENS PLACEMENT (IOC);  Surgeon: Elta Guadeloupe T. Gershon Crane, MD;  Location: [facility];  Service: Ophthalmology;  Laterality: Left;  CDE:15.32  . COLONOSCOPY    . LEFT HEART CATH AND CORONARY ANGIOGRAPHY N/A 07/30/2018   Procedure: LEFT HEART CATH AND CORONARY ANGIOGRAPHY;  Surgeon: Belva Crome, MD;  Location: [facility];  Service: Cardiovascular;  Laterality: N/A;  . MASTECTOMY PARTIAL / LUMPECTOMY W/ AXILLARY LYMPHADENECTOMY Right 1987  . THYROIDECTOMY    . TONSILLECTOMY    . YAG LASER APPLICATION Right 53/74/8270   Procedure: YAG LASER APPLICATION;  Surgeon: Rutherford Guys, MD;  Location: [facility];  Service: Ophthalmology;  Laterality: Right;  . YAG LASER APPLICATION Left 04/01/6753   Procedure: YAG LASER APPLICATION;  Surgeon: Rutherford Guys, MD;  Location: [facility];  Service: Ophthalmology;  Laterality: Left;    Family History  Problem Relation Age of Onset  . Lung cancer Mother   . Heart attack Father     Current Outpatient Medications on File Prior to Visit  Medication Sig Dispense Refill  . aspirin 81 MG tablet Take 81 mg by mouth daily.    Marland Kitchen atorvastatin (LIPITOR) 80 MG tablet Take 1 tablet by mouth once daily 90 tablet 1  . calcium-vitamin D (OSCAL WITH D) 500-200 MG-UNIT tablet Take 1 tablet  by mouth 3 (three) times daily. 90 tablet 1  . carvedilol (COREG) 3.125 MG tablet Take 1 tablet (3.125 mg total) by mouth 2 (two) times daily with a meal. 180 tablet 3  . clopidogrel (PLAVIX) 75 MG tablet Take 1 tablet by mouth once daily 90 tablet 1  . FLOVENT HFA 44 MCG/ACT inhaler Inhale 2 puffs into the lungs 2 (two) times daily.  11  . furosemide (LASIX) 20 MG tablet Take 20 mg by mouth every morning.    Marland Kitchen levothyroxine (SYNTHROID, LEVOTHROID) 100 MCG tablet Take 100 mcg by mouth daily.    . Multiple Vitamins-Calcium (ONE-A-DAY  WOMENS PO) Take 1 tablet by mouth daily.    . nitroGLYCERIN (NITROSTAT) 0.4 MG SL tablet Place 1 tablet (0.4 mg total) under the tongue every 5 (five) minutes x 3 doses as needed for chest pain. 25 tablet 1  . SSD 1 % cream APPLY A SMALL AMOUNT TOPICALLY TO THE FINGER TWICE DAILY. AVOID CONTACT WITH EYES.    . temazepam (RESTORIL) 30 MG capsule Take 30 mg by mouth at bedtime.    . vitamin C (ASCORBIC ACID) 500 MG tablet Take 500 mg by mouth daily.    Marland Kitchen spironolactone (ALDACTONE) 25 MG tablet Take 0.5 tablets (12.5 mg total) by mouth daily. 45 tablet 3   No current facility-administered medications on file prior to visit.    No Known Allergies  Social History   Substance and Sexual Activity  Alcohol Use No    Social History   Tobacco Use  Smoking Status Never Smoker  Smokeless Tobacco Never Used    Review of Systems  Constitutional: Negative.   HENT: Negative.   Eyes: Negative.   Respiratory: Negative.   Cardiovascular: Negative.   Gastrointestinal: Negative.   Genitourinary: Negative.   Musculoskeletal: Negative.   Skin: Negative.   Neurological: Negative.   Endo/Heme/Allergies: Negative.   Psychiatric/Behavioral: Negative.     Objective   Vitals:   11/30/20 1515  BP: 127/80  Pulse: 78  Resp: 14  Temp: (!) 97.4 F (36.3 C)  SpO2: 92%    Physical Exam Vitals reviewed.  Constitutional:      Appearance: Normal appearance. She is normal weight. She is not ill-appearing.  HENT:     Head: Normocephalic and atraumatic.  Cardiovascular:     Rate and Rhythm: Normal rate and regular rhythm.     Heart sounds: Normal heart sounds. No murmur heard. No friction rub. No gallop.   Pulmonary:     Effort: Pulmonary effort is normal. No respiratory distress.     Breath sounds: Normal breath sounds. No stridor. No wheezing, rhonchi or rales.  Skin:    General: Skin is warm and dry.  Neurological:     Mental Status: She is alert and oriented to person, place, and time.    Breast: Surgical scars are noted in the lower inner quadrant of the right breast as well as in the right axilla.  A dominant mass with bruising is noted just medial to the right nipple.  There is volume loss in the right breast compared to the left breast.  Left breast has no dominant mass, nipple discharge, or dimpling.  Both axillas are negative for palpable nodes.  Mammography and pathology reports reviewed  Assessment  Right breast carcinoma, status post right partial mastectomy with axillary dissection in the remote past for right breast cancer Cardiomyopathy, peripheral vascular disease, history of lymphoma, chronic kidney disease Plan   Patient has multiple comorbidities and  she would have to get preoperative clearance from cardiology concerning any surgical intervention.  Should she require surgery, a right simple mastectomy would be indicated.  I had a long discussion with the patient as to whether surgery would be in her best interest given her multiple comorbidities.  She is at high risk for cardiopulmonary difficulties with surgical intervention.  I told her I would like to discuss her situation with Dr. Delton Coombes.  Did also try to contact her son, and I left a message and will try to follow-up with him.  She will be seeing Dr. Delton Coombes soon.

## 2020-12-01 NOTE — Progress Notes (Signed)
Virtual Visit via Telephone Note  I connected with [name] on 12/01/20 at  4:30 PM EST by telephone and verified that I am speaking with the correct person using two identifiers.  Location: Patient: At home Provider: In the office   I discussed the limitations, risks, security and privacy concerns of performing an evaluation and management service by telephone and the availability of in person appointments. I also discussed with the patient that there may be a patient responsible charge related to this service. The patient expressed understanding and agreed to proceed.   History of Present Illness: [name] is followed in our office for stage II right breast cancer which was diagnosed in 1987 for which she underwent right lumpectomy and ALND.  She also received adjuvant radiation and tamoxifen.  She had stage I Hodgkin's lymphoma in 2005 and was treated with [facility] 5 regimen.   Observations/Objective: I have talked to her son [name] who lives in [place] and very concerned about his mother.  Mammogram on 11/09/2020 showed suspicious right breast calcifications.  She underwent biopsy of the right breast mass on 11/18/2020.  Assessment and Plan:  1.  Triple negative right breast cancer: -Biopsy on 11/18/2020 shows infiltrating ductal carcinoma of the right breast, Ki-67 30%, ER/PR/HER-2 negative. -Next best step is surgical resection with mastectomy. -However she has nonischemic cardiomyopathy with chronic congestive heart failure with LVEF 30-35% on the last echocardiogram from November 2020.  She also had recent worsening of kidney function and follows with Dr. Theador Hawthorne. -She will need surgical clearance from Dr. Domenic Polite. -I have requested Dr. Arnoldo Morale for mastectomy.  I do not believe she is a candidate for adjuvant or neoadjuvant chemotherapy. -If she is not a candidate for surgical resection, palliative radiation can be considered after consultation with radiation oncology. -I  have discussed all options with her son [name].   Follow Up Instructions: RTC after surgical resection.   I discussed the assessment and treatment plan with the patient. The patient was provided an opportunity to ask questions and all were answered. The patient agreed with the plan and demonstrated an understanding of the instructions.   The patient was advised to call back or seek an in-person evaluation if the symptoms worsen or if the condition fails to improve as anticipated.  I provided 21 minutes of non-face-to-face time during this encounter.   Derek Jack, MD

## 2020-12-02 ENCOUNTER — Ambulatory Visit: Payer: Medicare HMO | Admitting: General Surgery

## 2020-12-02 ENCOUNTER — Other Ambulatory Visit: Payer: Self-pay | Admitting: Family Medicine

## 2020-12-02 DIAGNOSIS — Z01810 Encounter for preprocedural cardiovascular examination: Secondary | ICD-10-CM

## 2020-12-02 DIAGNOSIS — C50911 Malignant neoplasm of unspecified site of right female breast: Secondary | ICD-10-CM

## 2020-12-07 ENCOUNTER — Ambulatory Visit: Payer: Medicare HMO | Admitting: Cardiology

## 2020-12-07 ENCOUNTER — Telehealth: Payer: Self-pay | Admitting: Cardiology

## 2020-12-07 ENCOUNTER — Encounter: Payer: Self-pay | Admitting: Cardiology

## 2020-12-07 VITALS — BP 116/80 | HR 84 | Ht 69.0 in | Wt 187.6 lb

## 2020-12-07 DIAGNOSIS — N1832 Chronic kidney disease, stage 3b: Secondary | ICD-10-CM

## 2020-12-07 DIAGNOSIS — I428 Other cardiomyopathies: Secondary | ICD-10-CM

## 2020-12-07 DIAGNOSIS — Z01810 Encounter for preprocedural cardiovascular examination: Secondary | ICD-10-CM

## 2020-12-07 MED ORDER — FUROSEMIDE 20 MG PO TABS: 20.0000 mg | ORAL_TABLET | Freq: Every morning | ORAL | 1 refills | Status: DC

## 2020-12-07 MED ORDER — ATORVASTATIN CALCIUM 80 MG PO TABS: 80.0000 mg | ORAL_TABLET | Freq: Every day | ORAL | 1 refills | Status: DC

## 2020-12-07 MED ORDER — CLOPIDOGREL BISULFATE 75 MG PO TABS: 75.0000 mg | ORAL_TABLET | Freq: Every day | ORAL | 1 refills | Status: DC

## 2020-12-07 MED ORDER — CARVEDILOL 3.125 MG PO TABS: 3.1250 mg | ORAL_TABLET | Freq: Two times a day (BID) | ORAL | 1 refills | Status: DC

## 2020-12-07 MED ORDER — SPIRONOLACTONE 25 MG PO TABS: 12.5000 mg | ORAL_TABLET | Freq: Every day | ORAL | 3 refills | Status: AC

## 2020-12-07 NOTE — Patient Instructions (Addendum)
Medication Instructions:   Your physician recommends that you continue on your current medications as directed. Please refer to the Current Medication list given to you today.  Labwork:  none  Testing/Procedures: Your physician has requested that you have an echocardiogram. Echocardiography is a painless test that uses sound waves to create images of your heart. It provides your doctor with information about the size and shape of your heart and how well your heart's chambers and valves are working. This procedure takes approximately one hour. There are no restrictions for this procedure.  Follow-Up:  Your physician recommends that you schedule a follow-up appointment in: 6 weeks in [place].   Any Other Special Instructions Will Be Listed Below (If Applicable).  If you need a refill on your cardiac medications before your next appointment, please call your pharmacy.

## 2020-12-07 NOTE — Telephone Encounter (Signed)
Pre-cert Verification for the following procedure    ECHO   DATE: 12/08/2020  LOCATION: [facility]

## 2020-12-07 NOTE — Progress Notes (Signed)
Cardiology Office Note  Date: 12/07/2020   ID: CONOR FILSAIME, DOB [birth year], MRN [id number]  PCP:  Lemmie Evens, MD  Cardiologist:  Rozann Lesches, MD Electrophysiologist:  None   Chief Complaint  Patient presents with  . Cardiac follow-up    History of Present Illness: [name] is an 87 y.o. female last seen in June 2021.  She is here for a routine follow-up visit.  She is following with Dr. Delton Coombes, recently diagnosed with infiltrating ductal carcinoma of the right breast with recommendation for mastectomy.  She was not felt to be a candidate for adjuvant or neoadjuvant chemotherapy.  She was seen by Dr. Arnoldo Morale regarding potential surgical management.  Possibility of palliative radiation treatment has also been considered were she to elect not to pursue surgery.  She has had worsening renal function, creatinine 1.7 up to 2.2.  She follows with Dr. Theador Hawthorne and was recently taken off Entresto.  Remainder of her medications from a cardiac perspective are noted below.  She has stay on low-dose Lasix and Aldactone, reports NYHA class II dyspnea with typical ADLs and her weight has been stable.  I personally reviewed her ECG today which shows sinus rhythm with PVCs, nonspecific T wave changes.  She has not had a follow-up echocardiogram since November 2020.  We discussed getting an updated study.  RCRI perioperative cardiac risk calculator indicates class IV, high risk, 11% chance of major adverse cardiac event.  Past Medical History:  Diagnosis Date  . Asthma   . Breast cancer, right breast ([place])   . CKD (chronic kidney disease), stage III ([place])   . Coronary atherosclerosis    a. cardiac catheterization in 2004 with 60% mid LAD b. nonobstructive disease by cath in 07/2018 at time of NSTEMI  . DVT (deep venous thrombosis) ([place])   . Essential hypertension   . History of breast cancer 1987   Right-sided, mastectomy with axillary node dissection and Adriamycin    . History of thyroid cancer   . Hodgkin lymphoma ([place]) 04/03/2017  . Hodgkin's disease ([place])   . Hypothyroidism   . Nonischemic cardiomyopathy ([place])    a. EF 35-40% by echo in 07/2018  . NSTEMI (non-ST elevated myocardial infarction) ([place]) 07/27/2018  . Obesity   . Osteoporosis   . Peripheral arterial disease ([place])    Stent to the left common iliac in 2006  . Thyroid carcinoma ([place]) 04/03/2017    Past Surgical History:  Procedure Laterality Date  . ABDOMINAL HYSTERECTOMY    . ANGIOPLASTY / STENTING FEMORAL Left 07/13/2005  . APPENDECTOMY    . BUNIONECTOMY Right   . CATARACT EXTRACTION W/PHACO  05/28/2012   Procedure: CATARACT EXTRACTION PHACO AND INTRAOCULAR LENS PLACEMENT (IOC);  Surgeon: Elta Guadeloupe T. Gershon Crane, MD;  Location: [facility];  Service: Ophthalmology;  Laterality: Right;  CDE 12.86  . CATARACT EXTRACTION W/PHACO  06/04/2012   Procedure: CATARACT EXTRACTION PHACO AND INTRAOCULAR LENS PLACEMENT (IOC);  Surgeon: Elta Guadeloupe T. Gershon Crane, MD;  Location: [facility];  Service: Ophthalmology;  Laterality: Left;  CDE:15.32  . COLONOSCOPY    . LEFT HEART CATH AND CORONARY ANGIOGRAPHY N/A 07/30/2018   Procedure: LEFT HEART CATH AND CORONARY ANGIOGRAPHY;  Surgeon: Belva Crome, MD;  Location: [facility];  Service: Cardiovascular;  Laterality: N/A;  . MASTECTOMY PARTIAL / LUMPECTOMY W/ AXILLARY LYMPHADENECTOMY Right 1987  . THYROIDECTOMY    . TONSILLECTOMY    . YAG LASER APPLICATION Right 79/39/0300   Procedure: YAG LASER APPLICATION;  Surgeon: Rutherford Guys, MD;  Location: [facility];  Service: Ophthalmology;  Laterality: Right;  . YAG LASER APPLICATION Left 7/0/2637   Procedure: YAG LASER APPLICATION;  Surgeon: Rutherford Guys, MD;  Location: [facility];  Service: Ophthalmology;  Laterality: Left;    Current Outpatient Medications  Medication Sig Dispense Refill  . aspirin 81 MG tablet Take 81 mg by mouth daily.    Marland Kitchen atorvastatin (LIPITOR) 80 MG tablet Take 1 tablet by mouth once daily 90 tablet 1  .  calcium-vitamin D (OSCAL WITH D) 500-200 MG-UNIT tablet Take 1 tablet by mouth 3 (three) times daily. 90 tablet 1  . carvedilol (COREG) 3.125 MG tablet Take 1 tablet (3.125 mg total) by mouth 2 (two) times daily with a meal. 180 tablet 3  . clopidogrel (PLAVIX) 75 MG tablet Take 1 tablet by mouth once daily 90 tablet 1  . FLOVENT HFA 44 MCG/ACT inhaler Inhale 2 puffs into the lungs 2 (two) times daily.  11  . furosemide (LASIX) 20 MG tablet Take 20 mg by mouth every morning.    Marland Kitchen levothyroxine (SYNTHROID, LEVOTHROID) 100 MCG tablet Take 100 mcg by mouth daily.    . Multiple Vitamins-Calcium (ONE-A-DAY WOMENS PO) Take 1 tablet by mouth daily.    . nitroGLYCERIN (NITROSTAT) 0.4 MG SL tablet Place 1 tablet (0.4 mg total) under the tongue every 5 (five) minutes x 3 doses as needed for chest pain. 25 tablet 1  . SSD 1 % cream APPLY A SMALL AMOUNT TOPICALLY TO THE FINGER TWICE DAILY. AVOID CONTACT WITH EYES.    . temazepam (RESTORIL) 30 MG capsule Take 30 mg by mouth at bedtime.    . vitamin C (ASCORBIC ACID) 500 MG tablet Take 500 mg by mouth daily.    Marland Kitchen spironolactone (ALDACTONE) 25 MG tablet Take 0.5 tablets (12.5 mg total) by mouth daily. 45 tablet 3   No current facility-administered medications for this visit.   Allergies:  Patient has no known allergies.   ROS: No palpitations or syncope.  Physical Exam: VS:  BP 116/80   Pulse 84   Ht 5\' 9"  (1.753 m)   Wt 187 lb 9.6 oz (85.1 kg)   BMI 27.70 kg/m , BMI Body mass index is 27.7 kg/m.  Wt Readings from Last 3 Encounters:  12/07/20 187 lb 9.6 oz (85.1 kg)  11/30/20 188 lb (85.3 kg)  11/11/20 183 lb 6.4 oz (83.2 kg)    General: Elderly woman, appears comfortable at rest. HEENT: Conjunctiva and lids normal, wearing a mask. Neck: Supple, no elevated JVP or carotid bruits, no thyromegaly. Lungs: Clear to auscultation, nonlabored breathing at rest. Cardiac: Regular rate and rhythm, no S3 or significant systolic murmur, no pericardial  rub. Extremities: Trace ankle edema.  ECG:  An ECG dated 03/19/2020 was personally reviewed today and demonstrated:  Sinus rhythm with decreased R wave progression, nonspecific T wave changes.  Recent Labwork: 09/13/2020: B Natriuretic Peptide 491.0 11/09/2020: ALT 15; AST 19; Hemoglobin 12.5; Platelets 240 11/26/2020: BUN 35; Creatinine, Ser 2.07; Potassium 4.2; Sodium 139     Component Value Date/Time   CHOL 184 06/13/2019 0840   TRIG 49 06/13/2019 0840   HDL 71 06/13/2019 0840   CHOLHDL 2.6 06/13/2019 0840   VLDL 10 06/13/2019 0840   LDLCALC 103 (H) 06/13/2019 0840    Other Studies Reviewed Today:  Echocardiogram 08/14/2019: 1. Left ventricular ejection fraction, by visual estimation, is 30 to  35%. The left ventricle has moderate to severely decreased function. There  is borderline left ventricular hypertrophy.  2. Elevated left ventricular end-diastolic pressure.  3. Left ventricular diastolic parameters are consistent with Grade I  diastolic dysfunction (impaired relaxation).  4. The left ventricle demonstrates global hypokinesis.  5. Global right ventricle has mildly reduced systolic function.The right  ventricular size is normal. No increase in right ventricular wall  thickness.  6. Left atrial size was mildly dilated.  7. Right atrial size was normal.  8. Moderate aortic valve annular calcification.  9. Moderate mitral annular calcification.  10. The mitral valve is degenerative. Moderate mitral valve regurgitation.  11. The tricuspid valve is grossly normal. Tricuspid valve regurgitation  is mild.  12. The aortic valve is tricuspid. Aortic valve regurgitation is not  visualized. Mild aortic valve sclerosis without stenosis.  13. The pulmonic valve was not well visualized. Pulmonic valve  regurgitation is not visualized.  14. Normal pulmonary artery systolic pressure.  15. The inferior vena cava is normal in size with greater than 50%  respiratory  variability, suggesting right atrial pressure of 3 mmHg.   Assessment and Plan:  1.  Nonischemic cardiomyopathy, LVEF 30 to 35% with mildly reduced RV contraction as of November 2020.  Medical regimen has been decreased in the setting of progressive renal insufficiency as discussed above.  Currently on Coreg, Lasix, and Aldactone.  Follow-up echocardiogram will be obtained.  Her weight is stable and blood pressure well controlled.  2.  Preoperative cardiovascular assessment with question of potential right mastectomy under general anesthesia.  RCRI cardiac risk calculator indicates high risk, class IV, 11% chance of major adverse cardiac event.  Although this does not preclude pursuing surgery, she is not leaning toward surgery, has been discussing this with her son and also other healthcare providers.  Would defer to Dr. Delton Coombes in terms of whether palliative radiation would be reasonable.  3.  CKD stage IIIb, worsening renal function noted with creatinine 2.1.  Following with Dr. Theador Hawthorne.  She was most recently taken off Entresto.  Medication Adjustments/Labs and Tests Ordered: Current medicines are reviewed at length with the patient today.  Concerns regarding medicines are outlined above.   Tests Ordered: Orders Placed This Encounter  Procedures  . EKG 12-Lead    Medication Changes: No orders of the defined types were placed in this encounter.   Disposition:  Follow up 6 weeks in the [place] office.  Signed, Satira Sark, MD, [facility] 12/07/2020 11:25 AM    [facility] [place], [place], [place] [address] Phone: [phone]; Fax: [phone]

## 2020-12-08 ENCOUNTER — Other Ambulatory Visit: Payer: Self-pay

## 2020-12-08 ENCOUNTER — Ambulatory Visit (HOSPITAL_COMMUNITY)
Admission: RE | Admit: 2020-12-08 | Discharge: 2020-12-08 | Disposition: A | Discharge status: Home / Self Care | Payer: Medicare HMO | Source: Ambulatory Visit | Attending: Cardiology | Admitting: Cardiology

## 2020-12-08 DIAGNOSIS — I428 Other cardiomyopathies: Secondary | ICD-10-CM

## 2020-12-08 LAB — ECHOCARDIOGRAM COMPLETE
Area-P 1/2: 5.02 cm2
Calc EF: 39.9 %
MV M vel: 5.91 m/s
MV Peak grad: 139.7 mmHg
Radius: 0.5 cm
S' Lateral: 4.4 cm
Single Plane A2C EF: 39.5 %
Single Plane A4C EF: 38 %

## 2020-12-08 NOTE — Progress Notes (Signed)
*  PRELIMINARY RESULTS* Echocardiogram 2D Echocardiogram has been performed.  [name] 12/08/2020, 12:31 PM

## 2020-12-10 ENCOUNTER — Telehealth: Payer: Self-pay | Admitting: *Deleted

## 2020-12-10 NOTE — Telephone Encounter (Signed)
-----   Message from Satira Sark, MD sent at 12/08/2020  7:13 PM EDT ----- Results reviewed.  LVEF has not changed significantly compared to the prior study, still approximately 35%.  RV contraction normal.  There is moderate mitral regurgitation, and also mild to moderate calcification of the aortic valve without stenosis.  Please see recent office note, would continue medical therapy and follow-up.  Anticipated surgical risk with mastectomy would still be in the high range.

## 2020-12-10 NOTE — Telephone Encounter (Signed)
Patient informed. Copy sent to PCP °

## 2020-12-23 ENCOUNTER — Encounter: Payer: Self-pay | Admitting: General Surgery

## 2020-12-23 ENCOUNTER — Ambulatory Visit (INDEPENDENT_AMBULATORY_CARE_PROVIDER_SITE_OTHER): Payer: Medicare HMO | Admitting: General Surgery

## 2020-12-23 ENCOUNTER — Other Ambulatory Visit: Payer: Self-pay

## 2020-12-23 VITALS — BP 109/74 | HR 92 | Temp 98.3°F | Resp 16 | Ht 69.0 in | Wt 185.0 lb

## 2020-12-23 DIAGNOSIS — C50412 Malignant neoplasm of upper-outer quadrant of left female breast: Secondary | ICD-10-CM

## 2020-12-23 DIAGNOSIS — Z171 Estrogen receptor negative status [ER-]: Secondary | ICD-10-CM

## 2020-12-23 NOTE — Progress Notes (Signed)
Subjective:     [name]  Patient presents to discuss her cardiac work-up and her left breast cancer.  Patient was seen by cardiology and her 2D echo showed a 35% ejection fraction.  Overall, they felt that she was a high risk for general anesthesia. Objective:    BP 109/74   Pulse 92   Temp 98.3 F (36.8 C) (Other (Comment))   Resp 16   Ht 5\' 9"  (1.753 m)   Wt 185 lb (83.9 kg)   BMI 27.32 kg/m   General:  alert, cooperative and no distress       Assessment:    Recurrent left breast carcinoma.  Preoperative cardiac clearance reveals she is at high risk for surgical intervention.  Patient is triple negative, thus we are somewhat limited as to chemotherapeutic options.      Plan:   I did tell the patient and her son that she was at significant risk for heart attack, stroke, heart failure, and even death.  She is very resistant to undergoing any further surgical therapy to treat the recurrent left breast cancer.  Her son and her agree that they would not like to proceed with any surgical invention at this time.  She is following up with Dr. Delton Coombes in April.  I told her that should she have any questions or would like to discuss her surgical options, she is more than happy to return to my care.  Follow-up as needed.

## 2021-01-07 ENCOUNTER — Other Ambulatory Visit: Payer: Self-pay

## 2021-01-07 ENCOUNTER — Other Ambulatory Visit (HOSPITAL_COMMUNITY)
Admission: RE | Admit: 2021-01-07 | Discharge: 2021-01-07 | Disposition: A | Discharge status: Home / Self Care | Payer: Medicare HMO | Source: Ambulatory Visit | Attending: Nephrology | Admitting: Nephrology

## 2021-01-07 DIAGNOSIS — N184 Chronic kidney disease, stage 4 (severe): Secondary | ICD-10-CM | POA: Diagnosis present

## 2021-01-07 LAB — CBC
HCT: 38.9 % (ref 36.0–46.0)
Hemoglobin: 12.2 g/dL (ref 12.0–15.0)
MCH: 28.5 pg (ref 26.0–34.0)
MCHC: 31.4 g/dL (ref 30.0–36.0)
MCV: 90.9 fL (ref 80.0–100.0)
Platelets: 287 10*3/uL (ref 150–400)
RBC: 4.28 MIL/uL (ref 3.87–5.11)
RDW: 16.3 % — ABNORMAL HIGH (ref 11.5–15.5)
WBC: 13.1 10*3/uL — ABNORMAL HIGH (ref 4.0–10.5)
nRBC: 0 % (ref 0.0–0.2)

## 2021-01-07 LAB — RENAL FUNCTION PANEL
Albumin: 3.7 g/dL (ref 3.5–5.0)
Anion gap: 12 (ref 5–15)
BUN: 28 mg/dL — ABNORMAL HIGH (ref 8–23)
CO2: 26 mmol/L (ref 22–32)
Calcium: 9 mg/dL (ref 8.9–10.3)
Chloride: 98 mmol/L (ref 98–111)
Creatinine, Ser: 1.85 mg/dL — ABNORMAL HIGH (ref 0.44–1.00)
GFR, Estimated: 26 mL/min — ABNORMAL LOW (ref >60)
Glucose, Bld: 111 mg/dL — ABNORMAL HIGH (ref 70–99)
Phosphorus: 4.7 mg/dL — ABNORMAL HIGH (ref 2.5–4.6)
Potassium: 4.2 mmol/L (ref 3.5–5.1)
Sodium: 136 mmol/L (ref 135–145)

## 2021-01-07 LAB — PROTEIN / CREATININE RATIO, URINE
Creatinine, Urine: 188.51 mg/dL
Protein Creatinine Ratio: 0.25 mg/mg{Cre} — ABNORMAL HIGH (ref 0.00–0.15)
Total Protein, Urine: 47 mg/dL

## 2021-01-13 ENCOUNTER — Encounter (HOSPITAL_COMMUNITY): Payer: Self-pay | Admitting: Lab

## 2021-01-13 ENCOUNTER — Inpatient Hospital Stay (HOSPITAL_COMMUNITY): Payer: Medicare HMO | Attending: Hematology | Admitting: Hematology

## 2021-01-13 ENCOUNTER — Other Ambulatory Visit: Payer: Self-pay

## 2021-01-13 VITALS — BP 142/87 | HR 69 | Temp 98.2°F | Resp 18 | Wt 184.0 lb

## 2021-01-13 DIAGNOSIS — Z853 Personal history of malignant neoplasm of breast: Secondary | ICD-10-CM

## 2021-01-13 DIAGNOSIS — Z85850 Personal history of malignant neoplasm of thyroid: Secondary | ICD-10-CM | POA: Insufficient documentation

## 2021-01-13 DIAGNOSIS — Z9221 Personal history of antineoplastic chemotherapy: Secondary | ICD-10-CM | POA: Insufficient documentation

## 2021-01-13 DIAGNOSIS — Z79899 Other long term (current) drug therapy: Secondary | ICD-10-CM | POA: Insufficient documentation

## 2021-01-13 DIAGNOSIS — N189 Chronic kidney disease, unspecified: Secondary | ICD-10-CM | POA: Insufficient documentation

## 2021-01-13 DIAGNOSIS — Z923 Personal history of irradiation: Secondary | ICD-10-CM | POA: Diagnosis not present

## 2021-01-13 DIAGNOSIS — C8100 Nodular lymphocyte predominant Hodgkin lymphoma, unspecified site: Secondary | ICD-10-CM | POA: Diagnosis not present

## 2021-01-13 DIAGNOSIS — Z7951 Long term (current) use of inhaled steroids: Secondary | ICD-10-CM | POA: Diagnosis not present

## 2021-01-13 DIAGNOSIS — Z7982 Long term (current) use of aspirin: Secondary | ICD-10-CM | POA: Insufficient documentation

## 2021-01-13 DIAGNOSIS — Z8571 Personal history of Hodgkin lymphoma: Secondary | ICD-10-CM | POA: Diagnosis not present

## 2021-01-13 NOTE — Progress Notes (Unsigned)
Referral sent to [facility]. Records faxed on 4/22

## 2021-01-13 NOTE — Progress Notes (Signed)
[facility] [address], [place] [address]   Patient Care Team: Lemmie Evens, MD as PCP - General (Family Medicine) Satira Sark, MD as PCP - Cardiology (Cardiology)  SUMMARY OF ONCOLOGIC HISTORY: Oncology History   No history exists.    CHIEF COMPLIANT: Follow-up for history of right breast cancer and Hodgkin's lymphoma   INTERVAL HISTORY: [name] is a 87 y.o. female here today for follow up of her history of right breast cancer and Hodgkin's lymphoma. She was last contacted via telephone on 12/01/2020.   Today she reports feeling okay. She denies having any new pains. She lives alone at home and does all of her chores and ADL's.  She is going to [place] at the end of April and will be gone through the 1st week of May.   REVIEW OF SYSTEMS:   Review of Systems  Constitutional: Positive for appetite change (75%) and fatigue (50%).  Musculoskeletal: Negative for arthralgias.  All other systems reviewed and are negative.   I have reviewed the past medical history, past surgical history, social history and family history with the patient and they are unchanged from previous note.   ALLERGIES:   has No Known Allergies.   MEDICATIONS:  Current Outpatient Medications  Medication Sig Dispense Refill  . aspirin 81 MG tablet Take 81 mg by mouth daily.    Marland Kitchen atorvastatin (LIPITOR) 80 MG tablet Take 1 tablet (80 mg total) by mouth daily. 90 tablet 1  . calcium-vitamin D (OSCAL WITH D) 500-200 MG-UNIT tablet Take 1 tablet by mouth 3 (three) times daily. 90 tablet 1  . carvedilol (COREG) 3.125 MG tablet Take 1 tablet (3.125 mg total) by mouth 2 (two) times daily with a meal. 180 tablet 1  . clopidogrel (PLAVIX) 75 MG tablet Take 1 tablet (75 mg total) by mouth daily. 90 tablet 1  . FLOVENT HFA 44 MCG/ACT inhaler Inhale 2 puffs into the lungs 2 (two) times daily.  11  . furosemide (LASIX) 20 MG tablet Take 1 tablet (20 mg total) by mouth  every morning. 90 tablet 1  . levothyroxine (SYNTHROID, LEVOTHROID) 100 MCG tablet Take 100 mcg by mouth daily.    . Multiple Vitamins-Calcium (ONE-A-DAY WOMENS PO) Take 1 tablet by mouth daily.    . nitroGLYCERIN (NITROSTAT) 0.4 MG SL tablet Place 1 tablet (0.4 mg total) under the tongue every 5 (five) minutes x 3 doses as needed for chest pain. 25 tablet 1  . spironolactone (ALDACTONE) 25 MG tablet Take 0.5 tablets (12.5 mg total) by mouth daily. 45 tablet 3  . SSD 1 % cream APPLY A SMALL AMOUNT TOPICALLY TO THE FINGER TWICE DAILY. AVOID CONTACT WITH EYES.    . temazepam (RESTORIL) 30 MG capsule Take 30 mg by mouth at bedtime.    . vitamin C (ASCORBIC ACID) 500 MG tablet Take 500 mg by mouth daily.    Marland Kitchen zolpidem (AMBIEN) 5 MG tablet Take 2.5-5 mg by mouth at bedtime.     No current facility-administered medications for this visit.     PHYSICAL EXAMINATION: Performance status (ECOG): 1 - Symptomatic but completely ambulatory  Vitals:   01/13/21 1532 01/13/21 1535  BP: (!) 155/78 (!) 142/87  Pulse:    Resp:    Temp:    SpO2:     Wt Readings from Last 3 Encounters:  01/13/21 184 lb (83.5 kg)  12/23/20 185 lb (83.9 kg)  12/07/20 187 lb 9.6 oz (85.1 kg)  Physical Exam Vitals reviewed.  Constitutional:      Appearance: Normal appearance.  Cardiovascular:     Rate and Rhythm: Normal rate and regular rhythm.     Pulses: Normal pulses.     Heart sounds: Normal heart sounds.  Pulmonary:     Effort: Pulmonary effort is normal.     Breath sounds: Normal breath sounds.  Chest:  Breasts:     Right: Normal. No swelling, bleeding, inverted nipple, mass, nipple discharge, skin change or tenderness.    Abdominal:     Palpations: Abdomen is soft. There is no hepatomegaly or mass.     Tenderness: There is no abdominal tenderness.  Musculoskeletal:     Right lower leg: No edema.     Left lower leg: No edema.  Neurological:     General: No focal deficit present.     Mental Status:  She is alert and oriented to person, place, and time.  Psychiatric:        Mood and Affect: Mood normal.        Behavior: Behavior normal.     Breast Exam Chaperone: Milinda Antis, MD     LABORATORY DATA:  I have reviewed the data as listed CMP Latest Ref Rng & Units 01/07/2021 11/26/2020 11/09/2020  Glucose 70 - 99 mg/dL 111(H) 92 104(H)  BUN 8 - 23 mg/dL 28(H) 35(H) 32(H)  Creatinine 0.44 - 1.00 mg/dL 1.85(H) 2.07(H) 1.93(H)  Sodium 135 - 145 mmol/L 136 139 135  Potassium 3.5 - 5.1 mmol/L 4.2 4.2 4.5  Chloride 98 - 111 mmol/L 98 101 98  CO2 22 - 32 mmol/L '26 25 28  ' Calcium 8.9 - 10.3 mg/dL 9.0 9.3 9.3  Total Protein 6.5 - 8.1 g/dL - - 7.6  Total Bilirubin 0.3 - 1.2 mg/dL - - 0.8  Alkaline Phos 38 - 126 U/L - - 47  AST 15 - 41 U/L - - 19  ALT 0 - 44 U/L - - 15   No results found for: [id number] Lab Results  Component Value Date   WBC 13.1 (H) 01/07/2021   HGB 12.2 01/07/2021   HCT 38.9 01/07/2021   MCV 90.9 01/07/2021   PLT 287 01/07/2021   NEUTROABS 2.9 11/09/2020    ASSESSMENT:  1. 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  2. Stage II right breast cancer: -Diagnosed in 1987, status post right lumpectomy and axillary lymph node dissection at [facility]. One lymph node was positive. -Adjuvant radiation therapy and tamoxifen.  3. Thyroid cancer: -Status post near total thyroidectomy.   PLAN:  1. 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  2. Stage II triple negative right breast cancer: - Mammogram on 11/09/2020 showed slight increase in calcifications in the right breast measuring 2.1 x 1.5 x 1.8 cm. - Biopsy on 11/18/2020 consistent with invasive ductal carcinoma in the lower inner quadrant right breast.  ER/PR was 0%.  Ki-67 was 30%.  HER2 was 2+ and equivocal.  HER2 by FISH was negative. -I have  recommended surgical resection.  She was evaluated by Dr. Arnoldo Morale and Dr. Domenic Polite.  She was thought to be high risk for surgery based on her cardiac function. - She has ejection fraction 30 to 35%.  She is independent of ADLs and IADLs. - Physical examination today reveals lump in the right breast lower inner quadrant is stable in size measuring about 2.5  cm. - Would recommend consultation with radiation oncology to see if palliative radiation can be given.  She already received adjuvant radiation to the right breast in 1987 at [facility] follow-up in 3 months.  3.  CKD: -Latest creatinine is 1.85.  Continue to follow-up with Dr. Theador Hawthorne.   Breast Cancer therapy associated bone loss: I have recommended calcium, Vitamin D and weight bearing exercises.   No orders of the defined types were placed in this encounter.  The patient has a good understanding of the overall plan. she agrees with it. she will call with any problems that may develop before the next visit here.    Derek Jack, MD [facility] [phone]   I, Milinda Antis, am acting as a scribe for Dr. Sanda Linger.  I, Derek Jack MD, have reviewed the above documentation for accuracy and completeness, and I agree with the above.

## 2021-01-13 NOTE — Patient Instructions (Signed)
[facility] at [facility] Discharge Instructions  You were seen today by Dr. Delton Coombes. He went over your recent results. You will be referred to Dr. Adella Nissen who is the radiation oncologist in [place] for possible breast radiation. Dr. Delton Coombes will see you back in 3 months for labs and follow up.   Thank you for choosing [facility] at [facility] to provide your oncology and hematology care.  To afford each patient quality time with our provider, please arrive at least 15 minutes before your scheduled appointment time.   If you have a lab appointment with the [facility] please come in thru the Main Entrance and check in at the main information desk  You need to re-schedule your appointment should you arrive 10 or more minutes late.  We strive to give you quality time with our providers, and arriving late affects you and other patients whose appointments are after yours.  Also, if you no show three or more times for appointments you may be dismissed from the clinic at the providers discretion.     Again, thank you for choosing [facility].  Our hope is that these requests will decrease the amount of time that you wait before being seen by our physicians.       _____________________________________________________________  Should you have questions after your visit to [facility], please contact our office at [phone] between the hours of 8:00 a.m. and 4:30 p.m.  Voicemails left after 4:00 p.m. will not be returned until the following business day.  For prescription refill requests, have your pharmacy contact our office and allow 72 hours.    Cancer Center Support Programs:   > Cancer Support Group  2nd Tuesday of the month 1pm-2pm, Journey Room

## 2021-01-19 ENCOUNTER — Encounter: Payer: Self-pay | Admitting: Student

## 2021-01-19 ENCOUNTER — Ambulatory Visit: Payer: Medicare HMO | Admitting: Student

## 2021-01-19 ENCOUNTER — Other Ambulatory Visit: Payer: Self-pay

## 2021-01-19 VITALS — BP 124/78 | HR 84 | Ht 69.0 in | Wt 180.4 lb

## 2021-01-19 DIAGNOSIS — N1832 Chronic kidney disease, stage 3b: Secondary | ICD-10-CM

## 2021-01-19 DIAGNOSIS — I428 Other cardiomyopathies: Secondary | ICD-10-CM

## 2021-01-19 DIAGNOSIS — I10 Essential (primary) hypertension: Secondary | ICD-10-CM | POA: Diagnosis not present

## 2021-01-19 DIAGNOSIS — I2510 Atherosclerotic heart disease of native coronary artery without angina pectoris: Secondary | ICD-10-CM

## 2021-01-19 DIAGNOSIS — E785 Hyperlipidemia, unspecified: Secondary | ICD-10-CM

## 2021-01-19 DIAGNOSIS — I5022 Chronic systolic (congestive) heart failure: Secondary | ICD-10-CM

## 2021-01-19 DIAGNOSIS — Z853 Personal history of malignant neoplasm of breast: Secondary | ICD-10-CM

## 2021-01-19 MED ORDER — CARVEDILOL 6.25 MG PO TABS: 6.2500 mg | ORAL_TABLET | Freq: Two times a day (BID) | ORAL | 3 refills | Status: AC

## 2021-01-19 NOTE — Progress Notes (Signed)
Cardiology Office Note    Date:  01/19/2021   ID:  [name], DOB [birth year], MRN [id number]  PCP:  Lemmie Evens, MD  Cardiologist: Rozann Lesches, MD    Chief Complaint  Patient presents with  . Follow-up    6 week visit    History of Present Illness:    [name] is a 87 y.o. female with past medical history of CAD (nonobstructive disease by cath in 07/2018 at time of NSTEMI), chronic systolic CHF/NICM (EF 63-87% by echo in 07/2018, 20-25% in 11/2018, 30-35% in 07/2019, 35% in 11/2020), HTN, HLD, history of right breast cancer (diagnosed in 1980's and underwent right lumpectomy, recently diagnosed with recurrent infiltrating ductal carcinoma of right breast) and Stage 3-4 CKD who presents to the office today for 6-week follow-up.  She was recently examined by Dr. Domenic Polite in 11/2020 and was being followed by Oncology given her recent diagnosis of breast cancer with possible upcoming mastectomy. She had NYHA Class II dyspnea but reported her weight had overall been stable. Delene Loll had recently been discontinued by Nephrology given her creatinine up to 2.2. She was continued on Coreg, Lasix and Aldactone with plans for a follow-up echocardiogram. Her echocardiogram showed that her EF was still approximately at 35% with normal RV function. She did have moderate mitral valve regurgitation and was felt to overall be high risk for mastectomy.  By review of notes, she did follow-up with Dr. Arnoldo Morale last month and did not wish to pursue surgical management at that time. Also followed-up with Dr. Delton Coombes on 01/13/2021 and it was recommended she have a consultation with Radiation Oncology in regards to palliative radiation.  In talking with the patient today, she reports overall doing well given her current medical issues. Still lives by herself and was pulling weeds in her yard earlier today. She is planing to travel to [place] next week for a family member's graduation.  She denies any chest pain or palpitations. Does experience occasional dyspnea on exertion but no recent orthopnea, PND or edema. Feels like her energy level has improved since Entresto was discontinued.    Past Medical History:  Diagnosis Date  . Asthma   . Breast cancer, right breast ([place])   . CKD (chronic kidney disease), stage III ([place])   . Coronary atherosclerosis    a. cardiac catheterization in 2004 with 60% mid LAD b. nonobstructive disease by cath in 07/2018 at time of NSTEMI  . DVT (deep venous thrombosis) ([place])   . Essential hypertension   . History of breast cancer 1987   Right-sided, mastectomy with axillary node dissection and Adriamycin   . History of thyroid cancer   . Hodgkin lymphoma ([place]) 04/03/2017  . Hodgkin's disease ([place])   . Hypothyroidism   . Nonischemic cardiomyopathy ([place])    a. EF 35-40% by echo in 07/2018  . NSTEMI (non-ST elevated myocardial infarction) ([place]) 07/27/2018  . Obesity   . Osteoporosis   . Peripheral arterial disease ([place])    Stent to the left common iliac in 2006  . Thyroid carcinoma ([place]) 04/03/2017    Past Surgical History:  Procedure Laterality Date  . ABDOMINAL HYSTERECTOMY    . ANGIOPLASTY / STENTING FEMORAL Left 07/13/2005  . APPENDECTOMY    . BUNIONECTOMY Right   . CATARACT EXTRACTION W/PHACO  05/28/2012   Procedure: CATARACT EXTRACTION PHACO AND INTRAOCULAR LENS PLACEMENT (IOC);  Surgeon: Elta Guadeloupe T. Gershon Crane, MD;  Location: [facility];  Service: Ophthalmology;  Laterality: Right;  CDE 12.86  .  CATARACT EXTRACTION W/PHACO  06/04/2012   Procedure: CATARACT EXTRACTION PHACO AND INTRAOCULAR LENS PLACEMENT (IOC);  Surgeon: Elta Guadeloupe T. Gershon Crane, MD;  Location: [facility];  Service: Ophthalmology;  Laterality: Left;  CDE:15.32  . COLONOSCOPY    . LEFT HEART CATH AND CORONARY ANGIOGRAPHY N/A 07/30/2018   Procedure: LEFT HEART CATH AND CORONARY ANGIOGRAPHY;  Surgeon: Belva Crome, MD;  Location: [facility];  Service: Cardiovascular;  Laterality:  N/A;  . MASTECTOMY PARTIAL / LUMPECTOMY W/ AXILLARY LYMPHADENECTOMY Right 1987  . THYROIDECTOMY    . TONSILLECTOMY    . YAG LASER APPLICATION Right 84/16/6063   Procedure: YAG LASER APPLICATION;  Surgeon: Rutherford Guys, MD;  Location: [facility];  Service: Ophthalmology;  Laterality: Right;  . YAG LASER APPLICATION Left 0/09/6008   Procedure: YAG LASER APPLICATION;  Surgeon: Rutherford Guys, MD;  Location: [facility];  Service: Ophthalmology;  Laterality: Left;    Current Medications: Outpatient Medications Prior to Visit  Medication Sig Dispense Refill  . aspirin 81 MG tablet Take 81 mg by mouth daily.    Marland Kitchen atorvastatin (LIPITOR) 80 MG tablet Take 1 tablet (80 mg total) by mouth daily. 90 tablet 1  . calcium-vitamin D (OSCAL WITH D) 500-200 MG-UNIT tablet Take 1 tablet by mouth 3 (three) times daily. 90 tablet 1  . clopidogrel (PLAVIX) 75 MG tablet Take 1 tablet (75 mg total) by mouth daily. 90 tablet 1  . FLOVENT HFA 44 MCG/ACT inhaler Inhale 2 puffs into the lungs 2 (two) times daily.  11  . furosemide (LASIX) 20 MG tablet Take 1 tablet (20 mg total) by mouth every morning. 90 tablet 1  . levothyroxine (SYNTHROID, LEVOTHROID) 100 MCG tablet Take 100 mcg by mouth daily.    . Multiple Vitamins-Calcium (ONE-A-DAY WOMENS PO) Take 1 tablet by mouth daily.    . nitroGLYCERIN (NITROSTAT) 0.4 MG SL tablet Place 1 tablet (0.4 mg total) under the tongue every 5 (five) minutes x 3 doses as needed for chest pain. 25 tablet 1  . spironolactone (ALDACTONE) 25 MG tablet Take 0.5 tablets (12.5 mg total) by mouth daily. 45 tablet 3  . SSD 1 % cream APPLY A SMALL AMOUNT TOPICALLY TO THE FINGER TWICE DAILY. AVOID CONTACT WITH EYES.    . temazepam (RESTORIL) 30 MG capsule Take 30 mg by mouth at bedtime.    . vitamin C (ASCORBIC ACID) 500 MG tablet Take 500 mg by mouth daily.    Marland Kitchen zolpidem (AMBIEN) 5 MG tablet Take 2.5-5 mg by mouth at bedtime.    . carvedilol (COREG) 3.125 MG tablet Take 1 tablet (3.125 mg total) by  mouth 2 (two) times daily with a meal. 180 tablet 1   No facility-administered medications prior to visit.     Allergies:   Patient has no known allergies.   Social History   Socioeconomic History  . Marital status: Widowed    Spouse name: Not on file  . Number of children: Not on file  . Years of education: Not on file  . Highest education level: Not on file  Occupational History  . Not on file  Tobacco Use  . Smoking status: Never Smoker  . Smokeless tobacco: Never Used  Vaping Use  . Vaping Use: Never used  Substance and Sexual Activity  . Alcohol use: No  . Drug use: No  . Sexual activity: Not Currently    Birth control/protection: Surgical  Other Topics Concern  . Not on file  Social History Narrative  .  Not on file   Social Determinants of Health   Financial Resource Strain: Low Risk   . Difficulty of Paying Living Expenses: Not very hard  Food Insecurity: No Food Insecurity  . Worried About Charity fundraiser in the Last Year: Never true  . Ran Out of Food in the Last Year: Never true  Transportation Needs: No Transportation Needs  . Lack of Transportation (Medical): No  . Lack of Transportation (Non-Medical): No  Physical Activity: Inactive  . Days of Exercise per Week: 0 days  . Minutes of Exercise per Session: 0 min  Stress: No Stress Concern Present  . Feeling of Stress : Not at all  Social Connections: Moderately Isolated  . Frequency of Communication with Friends and Family: Three times a week  . Frequency of Social Gatherings with Friends and Family: Twice a week  . Attends Religious Services: 1 to 4 times per year  . Active Member of Clubs or Organizations: No  . Attends Archivist Meetings: Never  . Marital Status: Widowed     Family History:  The patient's family history includes Heart attack in her father; Lung cancer in her mother.   Review of Systems:    Please see the history of present illness.     All other systems  reviewed and are otherwise negative except as noted above.   Physical Exam:    VS:  BP 124/78   Pulse 84   Ht 5\' 9"  (1.753 m)   Wt 180 lb 6.4 oz (81.8 kg)   SpO2 95%   BMI 26.64 kg/m    General: Well developed, elderly female appearing in no acute distress. Head: Normocephalic, atraumatic. Neck: No carotid bruits. JVD not elevated.  Lungs: Respirations regular and unlabored, without wheezes or rales.  Heart: Regular rate and rhythm. No S3 or S4. 2/6 systolic murmur along Apex.  Abdomen: Appears non-distended. No obvious abdominal masses. Msk:  Strength and tone appear normal for age. No obvious joint deformities or effusions. Extremities: No clubbing or cyanosis. No lower extremity edema.  Distal pedal pulses are 2+ bilaterally. Neuro: Alert and oriented X 3. Moves all extremities spontaneously. No focal deficits noted. Psych:  Responds to questions appropriately with a normal affect. Skin: No rashes or lesions noted  Wt Readings from Last 3 Encounters:  01/19/21 180 lb 6.4 oz (81.8 kg)  01/13/21 184 lb (83.5 kg)  12/23/20 185 lb (83.9 kg)     Studies/Labs Reviewed:   EKG:  EKG is not ordered today.    Recent Labs: 09/13/2020: B Natriuretic Peptide 491.0 11/09/2020: ALT 15 01/07/2021: BUN 28; Creatinine, Ser 1.85; Hemoglobin 12.2; Platelets 287; Potassium 4.2; Sodium 136   Lipid Panel    Component Value Date/Time   CHOL 184 06/13/2019 0840   TRIG 49 06/13/2019 0840   HDL 71 06/13/2019 0840   CHOLHDL 2.6 06/13/2019 0840   VLDL 10 06/13/2019 0840   LDLCALC 103 (H) 06/13/2019 0840    Additional studies/ records that were reviewed today include:   Cardiac Catheterization: 07/2018  Left dominant coronary anatomy.  Normal left main, short.  Nonobstructive proximal LAD 30% and somewhat hazy mid to distal LAD relatively focal 50% stenosis.  Dominant circumflex with large widely patent obtuse marginals.  Circumflex PDA is relatively small and could be a site of SACD  although no definite diagnostic features  LVEDP is normal.  Ventriculography was not performed.  Myocardial infarction with no obstructive coronary artery disease ([facility])  RECOMMENDATIONS:  Consider dual antiplatelet therapy (aspirin and Plavix)  Beta-blocker therapy if appropriate.  IV heparin has been discontinued and subcutaneous heparin DVT prophylaxis started.  Recommend uninterrupted dual antiplatelet therapy with Aspirin 81mg  daily and Clopidogrel 75mg  daily for a minimum of 6 months (stable ischemic heart disease - Class I recommendation).   Echocardiogram: 11/2020 IMPRESSIONS    1. Compared to report from November 2020, no significant change in LVEF  or MR.  2. LVEF is depressed with diffuse hypokinesis, worse in the inferior,  inferoseptal walls; inferolateral akinesis. . Left ventricular ejection  fraction, by estimation, is 35%%. The left ventricle has moderately  decreased function. The left ventricular  internal cavity size was mildly dilated. Indeterminate diastolic filling  due to E-A fusion.  3. Right ventricular systolic function is normal. The right ventricular  size is normal. There is normal pulmonary artery systolic pressure.  4. Left atrial size was mildly dilated.  5. There appear to be at least 2 jets of MR . The mitral valve is  degenerative. Moderate mitral valve regurgitation.  6. The aortic valve is tricuspid. Aortic valve regurgitation is not  visualized. Mild to moderate aortic valve sclerosis/calcification is  present, without any evidence of aortic stenosis.  7. The inferior vena cava is dilated in size with <50% respiratory  variability, suggesting right atrial pressure of 15 mmHg.   Assessment:    1. Chronic systolic heart failure ([place])   2. Nonischemic cardiomyopathy ([place])   3. Coronary artery disease involving native coronary artery of native heart without angina pectoris   4. Essential hypertension   5. Hyperlipidemia  LDL goal <70   6. Stage 3b chronic kidney disease ([place])   7. History of right breast cancer      Plan:   In order of problems listed above:  1. Chronic Systolic CHF/NICM - Her EF was at 35-40% by echo in 07/2018, 20-25% in 11/2018, 30-35% in 07/2019 and at 35% by most recent in 11/2020. She has stable dyspnea on exertion but no acute changes in this and no recent orthopnea, PND or edema.  - She was previously on Entresto but this was discontinued due to her rising creatinine. Currently on Coreg 3.125mg  BID, Spironolactone 12.5mg  daily and Lasix 20mg  daily. Will attempt to titrate Coreg to 6.25mg  BID and I recommended she wait until she returns from her upcoming trip to try this. If she develops worsening fatigue or soft BP, would reduce back to her current dosing. Could consider Hydralazine/Nitrates in the future but unsure if her BP would tolerate this and want to avoid hypotension as she is 87 yo.   2. CAD - She had nonobstructive disease by cath in 07/2018 as outlined above. Has stable dyspnea on exertion but denies any acute changes in this and no recent chest pain.  - Continue ASA 81mg  daily, Plavix 75mg  daily (has remained on this since NSTEMI in 2019 by review of notes), Coreg and Atorvastatin. Would recommend addressing at her next visit perhaps stopping Plavix given no recent interventions.   3. HTN - BP is well-controlled at 124/78 during today's visit. Will attempt to titrate Coreg as outlined above. Continue Lasix and Spironolactone at current dosing.   4. HLD - Followed by PCP. She remains on Atorvastatin 80mg  daily with goal LDL less than 70 with known CAD.   5. Stage 3-4 CKD - Followed by Dr. Theador Hawthorne. Creatinine was previously elevated to 2.2, down to 1.85 by repeat labs on 01/07/2021.  6. Right Breast Cancer -  Was initially diagnosed in 1980's and underwent right lumpectomy and was recently diagnosed with recurrent infiltrating ductal carcinoma of right breast. Has felt to  be high-risk for surgical management and was referred for palliative radiation by Oncology (she is meeting with them later this week per her report).    Medication Adjustments/Labs and Tests Ordered: Current medicines are reviewed at length with the patient today.  Concerns regarding medicines are outlined above.  Medication changes, Labs and Tests ordered today are listed in the Patient Instructions below. Patient Instructions  Medication Instructions:   Increase Coreg to 6.25mg  BID once you are back from your trip.   Labwork:  None  Testing/Procedures:  None  Follow-Up:  Will keep scheduled follow-up with Dr. Domenic Polite in 03/2021  Any Other Special Instructions Will Be Listed Below (If Applicable).     If you need a refill on your cardiac medications before your next appointment, please call your pharmacy.      Signed, Erma Heritage, PA-C  01/19/2021 8:07 PM    [facility] [address] [place], [place] [address] Phone: [phone] Fax: [phone]

## 2021-01-19 NOTE — Patient Instructions (Signed)
Medication Instructions:   Increase Coreg to 6.25mg  BID once you are back from your trip.   Labwork:  None  Testing/Procedures:  None  Follow-Up:  Will keep scheduled follow-up with Dr. Domenic Polite in 03/2021  Any Other Special Instructions Will Be Listed Below (If Applicable).     If you need a refill on your cardiac medications before your next appointment, please call your pharmacy.

## 2021-03-25 ENCOUNTER — Other Ambulatory Visit (HOSPITAL_COMMUNITY)
Admission: RE | Admit: 2021-03-25 | Discharge: 2021-03-25 | Disposition: A | Discharge status: Home / Self Care | Payer: Medicare HMO | Source: Ambulatory Visit | Attending: Nephrology | Admitting: Nephrology

## 2021-03-25 ENCOUNTER — Ambulatory Visit: Payer: Medicare HMO | Admitting: Cardiology

## 2021-03-25 ENCOUNTER — Other Ambulatory Visit: Payer: Self-pay

## 2021-03-25 DIAGNOSIS — R809 Proteinuria, unspecified: Secondary | ICD-10-CM | POA: Diagnosis present

## 2021-03-25 DIAGNOSIS — E211 Secondary hyperparathyroidism, not elsewhere classified: Secondary | ICD-10-CM | POA: Insufficient documentation

## 2021-03-25 DIAGNOSIS — I5042 Chronic combined systolic (congestive) and diastolic (congestive) heart failure: Secondary | ICD-10-CM | POA: Insufficient documentation

## 2021-03-25 DIAGNOSIS — N184 Chronic kidney disease, stage 4 (severe): Secondary | ICD-10-CM | POA: Insufficient documentation

## 2021-03-25 DIAGNOSIS — N170 Acute kidney failure with tubular necrosis: Secondary | ICD-10-CM | POA: Insufficient documentation

## 2021-03-25 LAB — PROTEIN / CREATININE RATIO, URINE
Creatinine, Urine: 274.6 mg/dL
Protein Creatinine Ratio: 0.05 mg/mg{Cre} (ref 0.00–0.15)
Total Protein, Urine: 15 mg/dL

## 2021-03-25 LAB — CBC
HCT: 36.4 % (ref 36.0–46.0)
Hemoglobin: 11.4 g/dL — ABNORMAL LOW (ref 12.0–15.0)
MCH: 28.9 pg (ref 26.0–34.0)
MCHC: 31.3 g/dL (ref 30.0–36.0)
MCV: 92.2 fL (ref 80.0–100.0)
Platelets: 195 10*3/uL (ref 150–400)
RBC: 3.95 MIL/uL (ref 3.87–5.11)
RDW: 16.5 % — ABNORMAL HIGH (ref 11.5–15.5)
WBC: 4.7 10*3/uL (ref 4.0–10.5)
nRBC: 0 % (ref 0.0–0.2)

## 2021-03-25 LAB — RENAL FUNCTION PANEL
Albumin: 3.6 g/dL (ref 3.5–5.0)
Anion gap: 7 (ref 5–15)
BUN: 31 mg/dL — ABNORMAL HIGH (ref 8–23)
CO2: 28 mmol/L (ref 22–32)
Calcium: 8.6 mg/dL — ABNORMAL LOW (ref 8.9–10.3)
Chloride: 102 mmol/L (ref 98–111)
Creatinine, Ser: 1.75 mg/dL — ABNORMAL HIGH (ref 0.44–1.00)
GFR, Estimated: 28 mL/min — ABNORMAL LOW (ref >60)
Glucose, Bld: 90 mg/dL (ref 70–99)
Phosphorus: 4.8 mg/dL — ABNORMAL HIGH (ref 2.5–4.6)
Potassium: 4.6 mmol/L (ref 3.5–5.1)
Sodium: 137 mmol/L (ref 135–145)

## 2021-04-11 NOTE — Progress Notes (Signed)
Cardiology Office Note  Date: 04/12/2021   ID: TRAN RANDLE, DOB 1929/12/22, MRN [id number]  PCP:  Lemmie Evens, MD  Cardiologist:  Rozann Lesches, MD Electrophysiologist:  None   Chief Complaint  Patient presents with   Cardiac follow-up    History of Present Illness: [name] is an 87 y.o. female last seen in April by Ms. Strader PA-C.  She is here for a routine visit.  Reports NYHA class II-III dyspnea depending on level of activity, no chest pain or palpitations.  Follow-up echocardiogram in March revealed stable LVEF at approximately 35%, normal RV contraction, moderate mitral regurgitation, and aortic valve sclerosis without stenosis.  She continues to follow with Dr. Theador Hawthorne with CKD stage IIIb-IV.  Recent creatinine 1.75.  She is also following with Dr. Delton Coombes and has undergone palliative radiation for treatment of recurrent right breast cancer.  I reviewed her medications.  She did have to come off Entresto due to acute on chronic renal insufficiency.  Past Medical History:  Diagnosis Date   Asthma    Breast cancer, right breast ([place])    CKD (chronic kidney disease), stage III ([place])    Coronary atherosclerosis    a. cardiac catheterization in 2004 with 60% mid LAD b. nonobstructive disease by cath in 07/2018 at time of NSTEMI   DVT (deep venous thrombosis) ([facility])    Essential hypertension    History of breast cancer 1987   Right-sided, mastectomy with axillary node dissection and Adriamycin    History of thyroid cancer    Hodgkin lymphoma ([place]) 04/03/2017   Hodgkin's disease ([place])    Hypothyroidism    Nonischemic cardiomyopathy ([place])    a. EF 35-40% by echo in 07/2018   NSTEMI (non-ST elevated myocardial infarction) ([place]) 07/27/2018   Obesity    Osteoporosis    Peripheral arterial disease ([place])    Stent to the left common iliac in 2006   Thyroid carcinoma ([place]) 04/03/2017    Past Surgical History:  Procedure Laterality Date    ABDOMINAL HYSTERECTOMY     ANGIOPLASTY / STENTING FEMORAL Left 07/13/2005   APPENDECTOMY     BUNIONECTOMY Right    CATARACT EXTRACTION W/PHACO  05/28/2012   Procedure: CATARACT EXTRACTION PHACO AND INTRAOCULAR LENS PLACEMENT ([place]);  Surgeon: Elta Guadeloupe T. Gershon Crane, MD;  Location: [facility];  Service: Ophthalmology;  Laterality: Right;  CDE 12.86   CATARACT EXTRACTION W/PHACO  06/04/2012   Procedure: CATARACT EXTRACTION PHACO AND INTRAOCULAR LENS PLACEMENT ([facility]);  Surgeon: Elta Guadeloupe T. Gershon Crane, MD;  Location: [facility];  Service: Ophthalmology;  Laterality: Left;  CDE:15.32   COLONOSCOPY     LEFT HEART CATH AND CORONARY ANGIOGRAPHY N/A 07/30/2018   Procedure: LEFT HEART CATH AND CORONARY ANGIOGRAPHY;  Surgeon: Belva Crome, MD;  Location: [facility];  Service: Cardiovascular;  Laterality: N/A;   MASTECTOMY PARTIAL / LUMPECTOMY W/ AXILLARY LYMPHADENECTOMY Right 1987   THYROIDECTOMY     TONSILLECTOMY     YAG LASER APPLICATION Right 23/30/0762   Procedure: YAG LASER APPLICATION;  Surgeon: Rutherford Guys, MD;  Location: [facility];  Service: Ophthalmology;  Laterality: Right;   YAG LASER APPLICATION Left 10/31/3333   Procedure: YAG LASER APPLICATION;  Surgeon: Rutherford Guys, MD;  Location: [facility];  Service: Ophthalmology;  Laterality: Left;    Current Outpatient Medications  Medication Sig Dispense Refill   aspirin 81 MG tablet Take 81 mg by mouth daily.     atorvastatin (LIPITOR) 80 MG tablet Take 1 tablet (80 mg  total) by mouth daily. 90 tablet 1   calcium-vitamin D (OSCAL WITH D) 500-200 MG-UNIT tablet Take 1 tablet by mouth 3 (three) times daily. 90 tablet 1   carvedilol (COREG) 6.25 MG tablet Take 1 tablet (6.25 mg total) by mouth 2 (two) times daily with a meal. 180 tablet 3   clopidogrel (PLAVIX) 75 MG tablet Take 1 tablet (75 mg total) by mouth daily. 90 tablet 1   FLOVENT HFA 44 MCG/ACT inhaler Inhale 2 puffs into the lungs 2 (two) times daily.  11   hydrALAZINE (APRESOLINE) 10 MG tablet Take 1 tablet  (10 mg total) by mouth in the morning and at bedtime. 180 tablet 3   isosorbide mononitrate (IMDUR) 30 MG 24 hr tablet Take 0.5 tablets (15 mg total) by mouth daily. 45 tablet 3   levothyroxine (SYNTHROID, LEVOTHROID) 100 MCG tablet Take 100 mcg by mouth daily.     Multiple Vitamins-Calcium (ONE-A-DAY WOMENS PO) Take 1 tablet by mouth daily.     spironolactone (ALDACTONE) 25 MG tablet Take 0.5 tablets (12.5 mg total) by mouth daily. 45 tablet 3   temazepam (RESTORIL) 30 MG capsule Take 30 mg by mouth at bedtime.     vitamin C (ASCORBIC ACID) 500 MG tablet Take 500 mg by mouth daily.     furosemide (LASIX) 20 MG tablet Take 1 tablet (20 mg total) by mouth every morning. (Patient not taking: Reported on 04/12/2021) 90 tablet 1   nitroGLYCERIN (NITROSTAT) 0.4 MG SL tablet Place 1 tablet (0.4 mg total) under the tongue every 5 (five) minutes x 3 doses as needed for chest pain. (Patient not taking: Reported on 04/12/2021) 25 tablet 1   SSD 1 % cream APPLY A SMALL AMOUNT TOPICALLY TO THE FINGER TWICE DAILY. AVOID CONTACT WITH EYES. (Patient not taking: Reported on 04/12/2021)     zolpidem (AMBIEN) 5 MG tablet Take 2.5-5 mg by mouth at bedtime. (Patient not taking: Reported on 04/12/2021)     No current facility-administered medications for this visit.   Allergies:  Patient has no known allergies.   ROS: No orthopnea or PND.  Physical Exam: VS:  BP 126/74   Pulse 82   Ht 5\' 9"  (1.753 m)   Wt 182 lb 3.2 oz (82.6 kg)   SpO2 96%   BMI 26.91 kg/m , BMI Body mass index is 26.91 kg/m.  Wt Readings from Last 3 Encounters:  04/12/21 182 lb 3.2 oz (82.6 kg)  01/19/21 180 lb 6.4 oz (81.8 kg)  01/13/21 184 lb (83.5 kg)    General: Patient appears comfortable at rest. HEENT: Conjunctiva and lids normal, wearing a mask. Neck: Supple, no elevated JVP or carotid bruits, no thyromegaly. Lungs: Clear to auscultation, nonlabored breathing at rest. Cardiac: Regular rate and rhythm, no S3, 1/6 systolic murmur,  no pericardial rub. Extremities: No pitting edema.  ECG:  An ECG dated 12/07/2020 was personally reviewed today and demonstrated:  Sinus rhythm with PVCs, nonspecific T wave changes.  Recent Labwork: 09/13/2020: B Natriuretic Peptide 491.0 11/09/2020: ALT 15; AST 19 03/25/2021: BUN 31; Creatinine, Ser 1.75; Hemoglobin 11.4; Platelets 195; Potassium 4.6; Sodium 137     Component Value Date/Time   CHOL 184 06/13/2019 0840   TRIG 49 06/13/2019 0840   HDL 71 06/13/2019 0840   CHOLHDL 2.6 06/13/2019 0840   VLDL 10 06/13/2019 0840   LDLCALC 103 (H) 06/13/2019 0840    Other Studies Reviewed Today:  Echocardiogram 12/08/2020:  1. Compared to report from November 2020, no significant  change in LVEF  or MR.   2. LVEF is depressed with diffuse hypokinesis, worse in the inferior,  inferoseptal walls; inferolateral akinesis. . Left ventricular ejection  fraction, by estimation, is 35%%. The left ventricle has moderately  decreased function. The left ventricular  internal cavity size was mildly dilated. Indeterminate diastolic filling  due to E-A fusion.   3. Right ventricular systolic function is normal. The right ventricular  size is normal. There is normal pulmonary artery systolic pressure.   4. Left atrial size was mildly dilated.   5. There appear to be at least 2 jets of MR . The mitral valve is  degenerative. Moderate mitral valve regurgitation.   6. The aortic valve is tricuspid. Aortic valve regurgitation is not  visualized. Mild to moderate aortic valve sclerosis/calcification is  present, without any evidence of aortic stenosis.   7. The inferior vena cava is dilated in size with <50% respiratory  variability, suggesting right atrial pressure of 15 mmHg.   Assessment and Plan:  1.  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  Weight is stable, no evidence of fluid overload at this time.  Continue Coreg, Aldactone, and Lasix.  Did not tolerate Entresto  due to acute on chronic renal insufficiency.  Start hydralazine 10 mg twice daily and Imdur 15 mg once in the evening.  Hopefully she will tolerate low-doses without causing any symptomatic hypotension.  2.  CKD stage IIIb-IV, following with Dr. Theador Hawthorne.  She did not tolerate Entresto due to acute on chronic renal insufficiency.  Recent follow-up creatinine down to 1.75 and potassium high normal.  Medication Adjustments/Labs and Tests Ordered: Current medicines are reviewed at length with the patient today.  Concerns regarding medicines are outlined above.   Tests Ordered: No orders of the defined types were placed in this encounter.   Medication Changes: Meds ordered this encounter  Medications   hydrALAZINE (APRESOLINE) 10 MG tablet    Sig: Take 1 tablet (10 mg total) by mouth in the morning and at bedtime.    Dispense:  180 tablet    Refill:  3   isosorbide mononitrate (IMDUR) 30 MG 24 hr tablet    Sig: Take 0.5 tablets (15 mg total) by mouth daily.    Dispense:  45 tablet    Refill:  3     Disposition:  Follow up  6 months.  Signed, Satira Sark, MD, [facility] 04/12/2021 10:18 AM    [facility]. [address] [place], [place] [address] Phone: [phone]; Fax: [phone]

## 2021-04-12 ENCOUNTER — Other Ambulatory Visit: Payer: Self-pay

## 2021-04-12 ENCOUNTER — Encounter: Payer: Self-pay | Admitting: Cardiology

## 2021-04-12 ENCOUNTER — Ambulatory Visit: Payer: Medicare HMO | Admitting: Cardiology

## 2021-04-12 VITALS — BP 126/74 | HR 82 | Ht 69.0 in | Wt 182.2 lb

## 2021-04-12 DIAGNOSIS — N184 Chronic kidney disease, stage 4 (severe): Secondary | ICD-10-CM | POA: Diagnosis not present

## 2021-04-12 DIAGNOSIS — I5042 Chronic combined systolic (congestive) and diastolic (congestive) heart failure: Secondary | ICD-10-CM | POA: Diagnosis not present

## 2021-04-12 MED ORDER — ISOSORBIDE MONONITRATE ER 30 MG PO TB24: 15.0000 mg | ORAL_TABLET | Freq: Every day | ORAL | 3 refills | Status: DC

## 2021-04-12 MED ORDER — HYDRALAZINE HCL 10 MG PO TABS: 10.0000 mg | ORAL_TABLET | Freq: Two times a day (BID) | ORAL | 3 refills | Status: DC

## 2021-04-12 NOTE — Patient Instructions (Signed)
Medication Instructions:  Your physician has recommended you make the following change in your medication:  START Hydralazine 10 mg tablets twice daily START Imdur 15 mg tablets once daily  *If you need a refill on your cardiac medications before your next appointment, please call your pharmacy*   Lab Work: None If you have labs (blood work) drawn today and your tests are completely normal, you will receive your results only by: [place] (if you have MyChart) OR A paper copy in the mail If you have any lab test that is abnormal or we need to change your treatment, we will call you to review the results.   Testing/Procedures: None   Follow-Up: At [facility], you and your health needs are our priority.  As part of our continuing mission to provide you with exceptional heart care, we have created designated Provider Care Teams.  These Care Teams include your primary Cardiologist (physician) and Advanced Practice Providers (APPs -  Physician Assistants and Nurse Practitioners) who all work together to provide you with the care you need, when you need it.  We recommend signing up for the patient portal called "MyChart".  Sign up information is provided on this After Visit Summary.  MyChart is used to connect with patients for Virtual Visits (Telemedicine).  Patients are able to view lab/test results, encounter notes, upcoming appointments, etc.  Non-urgent messages can be sent to your provider as well.   To learn more about what you can do with MyChart, go to [email or web address].    Your next appointment:   3 month(s)  The format for your next appointment:   In Person  Provider:   Rozann Lesches, MD   Other Instructions

## 2021-04-14 ENCOUNTER — Other Ambulatory Visit: Payer: Self-pay

## 2021-04-14 ENCOUNTER — Inpatient Hospital Stay (HOSPITAL_COMMUNITY): Payer: Medicare HMO | Attending: Oncology | Admitting: Oncology

## 2021-04-14 VITALS — BP 128/76 | HR 73 | Temp 98.6°F | Resp 16 | Wt 181.7 lb

## 2021-04-14 DIAGNOSIS — D649 Anemia, unspecified: Secondary | ICD-10-CM | POA: Insufficient documentation

## 2021-04-14 DIAGNOSIS — Z853 Personal history of malignant neoplasm of breast: Secondary | ICD-10-CM | POA: Insufficient documentation

## 2021-04-14 DIAGNOSIS — Z923 Personal history of irradiation: Secondary | ICD-10-CM | POA: Diagnosis not present

## 2021-04-14 DIAGNOSIS — C8100 Nodular lymphocyte predominant Hodgkin lymphoma, unspecified site: Secondary | ICD-10-CM | POA: Diagnosis not present

## 2021-04-14 DIAGNOSIS — Z8571 Personal history of Hodgkin lymphoma: Secondary | ICD-10-CM | POA: Insufficient documentation

## 2021-04-14 DIAGNOSIS — Z85850 Personal history of malignant neoplasm of thyroid: Secondary | ICD-10-CM | POA: Diagnosis present

## 2021-04-14 DIAGNOSIS — Z79899 Other long term (current) drug therapy: Secondary | ICD-10-CM | POA: Insufficient documentation

## 2021-04-14 DIAGNOSIS — Z7951 Long term (current) use of inhaled steroids: Secondary | ICD-10-CM | POA: Insufficient documentation

## 2021-04-14 DIAGNOSIS — Z7982 Long term (current) use of aspirin: Secondary | ICD-10-CM | POA: Diagnosis not present

## 2021-04-14 NOTE — Progress Notes (Signed)
[facility] [address] [place] [address]   Patient Care Team: Lemmie Evens, MD as PCP - General (Family Medicine) Satira Sark, MD as PCP - Cardiology (Cardiology)  SUMMARY OF ONCOLOGIC HISTORY: Oncology History   No history exists.    CHIEF COMPLIANT: Follow-up for history of right breast cancer and Hodgkin's lymphoma   INTERVAL HISTORY: [name] is a 87 y.o. female here today for follow up of her history of right breast cancer and Hodgkin's lymphoma. She was last seen on 01/13/21.  She is status post palliative radiation for recurrent right breast cancer.  She was seen by cardiology last on 04/12/2021 for nonischemic cardiomyopathy with chronic heart failure-EF 35%.  Stable on Coreg, Aldactone and Lasix.  Unable to tolerate Entresto due to CKD.  Recently started on hydralazine 10 mg twice daily and Imdur 15 mg once in the evening.   Today she reports feeling well.  Energy levels are stable but low.  Appetite is great.  She denies any pain.   REVIEW OF SYSTEMS:   Review of Systems  Constitutional:  Negative for appetite change, fatigue, fever and unexpected weight change.  HENT:   Negative for nosebleeds, sore throat and trouble swallowing.   Eyes: Negative.   Respiratory: Negative.  Negative for cough, shortness of breath and wheezing.   Cardiovascular: Negative.  Negative for chest pain and leg swelling.  Gastrointestinal:  Negative for abdominal pain, blood in stool, constipation, diarrhea, nausea and vomiting.  Endocrine: Negative.   Genitourinary: Negative.  Negative for bladder incontinence, hematuria and nocturia.   Musculoskeletal: Negative.  Negative for back pain and flank pain.  Skin: Negative.   Neurological: Negative.  Negative for dizziness, headaches, light-headedness and numbness.  Hematological: Negative.   Psychiatric/Behavioral: Negative.  Negative for confusion. The patient is not nervous/anxious.    I have  reviewed the past medical history, past surgical history, social history and family history with the patient and they are unchanged from previous note.   ALLERGIES:   has No Known Allergies.   MEDICATIONS:  Current Outpatient Medications  Medication Sig Dispense Refill   aspirin 81 MG tablet Take 81 mg by mouth daily.     atorvastatin (LIPITOR) 80 MG tablet Take 1 tablet (80 mg total) by mouth daily. 90 tablet 1   calcium-vitamin D (OSCAL WITH D) 500-200 MG-UNIT tablet Take 1 tablet by mouth 3 (three) times daily. 90 tablet 1   carvedilol (COREG) 6.25 MG tablet Take 1 tablet (6.25 mg total) by mouth 2 (two) times daily with a meal. 180 tablet 3   clopidogrel (PLAVIX) 75 MG tablet Take 1 tablet (75 mg total) by mouth daily. 90 tablet 1   FLOVENT HFA 44 MCG/ACT inhaler Inhale 2 puffs into the lungs 2 (two) times daily.  11   furosemide (LASIX) 20 MG tablet Take 1 tablet (20 mg total) by mouth every morning. (Patient not taking: Reported on 04/12/2021) 90 tablet 1   hydrALAZINE (APRESOLINE) 10 MG tablet Take 1 tablet (10 mg total) by mouth in the morning and at bedtime. 180 tablet 3   isosorbide mononitrate (IMDUR) 30 MG 24 hr tablet Take 0.5 tablets (15 mg total) by mouth daily. 45 tablet 3   levothyroxine (SYNTHROID, LEVOTHROID) 100 MCG tablet Take 100 mcg by mouth daily.     Multiple Vitamin (MULTI-VITAMIN) tablet Take 1 tablet by mouth daily.     Multiple Vitamins-Calcium (ONE-A-DAY WOMENS PO) Take 1 tablet by mouth daily.  nitroGLYCERIN (NITROSTAT) 0.4 MG SL tablet Place 1 tablet (0.4 mg total) under the tongue every 5 (five) minutes x 3 doses as needed for chest pain. (Patient not taking: Reported on 04/12/2021) 25 tablet 1   spironolactone (ALDACTONE) 25 MG tablet Take 0.5 tablets (12.5 mg total) by mouth daily. 45 tablet 3   SSD 1 % cream APPLY A SMALL AMOUNT TOPICALLY TO THE FINGER TWICE DAILY. AVOID CONTACT WITH EYES. (Patient not taking: Reported on 04/12/2021)     temazepam  (RESTORIL) 30 MG capsule Take 30 mg by mouth at bedtime.     vitamin C (ASCORBIC ACID) 500 MG tablet Take 500 mg by mouth daily.     zolpidem (AMBIEN) 5 MG tablet Take 2.5-5 mg by mouth at bedtime. (Patient not taking: Reported on 04/12/2021)     No current facility-administered medications for this visit.     PHYSICAL EXAMINATION: Performance status (ECOG): 1 - Symptomatic but completely ambulatory  Vitals:   04/14/21 1425  BP: 128/76  Pulse: 73  Resp: 16  Temp: 98.6 F (37 C)  SpO2: 98%   Wt Readings from Last 3 Encounters:  04/14/21 181 lb 11.2 oz (82.4 kg)  04/12/21 182 lb 3.2 oz (82.6 kg)  01/19/21 180 lb 6.4 oz (81.8 kg)   Physical Exam Constitutional:      Appearance: Normal appearance.  HENT:     Head: Normocephalic and atraumatic.  Eyes:     Pupils: Pupils are equal, round, and reactive to light.  Cardiovascular:     Rate and Rhythm: Normal rate and regular rhythm.     Heart sounds: Normal heart sounds. No murmur heard. Pulmonary:     Effort: Pulmonary effort is normal.     Breath sounds: Normal breath sounds. No wheezing.  Abdominal:     General: Bowel sounds are normal. There is no distension.     Palpations: Abdomen is soft.     Tenderness: There is no abdominal tenderness.  Musculoskeletal:        General: Normal range of motion.     Cervical back: Normal range of motion.  Skin:    General: Skin is warm and dry.     Findings: No rash.  Neurological:     Mental Status: She is alert and oriented to person, place, and time.  Psychiatric:        Judgment: Judgment normal.    Breast Exam Chaperone: Milinda Antis, MD     LABORATORY DATA:  I have reviewed the data as listed CMP Latest Ref Rng & Units 03/25/2021 01/07/2021 11/26/2020  Glucose 70 - 99 mg/dL 90 111(H) 92  BUN 8 - 23 mg/dL 31(H) 28(H) 35(H)  Creatinine 0.44 - 1.00 mg/dL 1.75(H) 1.85(H) 2.07(H)  Sodium 135 - 145 mmol/L 137 136 139  Potassium 3.5 - 5.1 mmol/L 4.6 4.2 4.2  Chloride 98 -  111 mmol/L 102 98 101  CO2 22 - 32 mmol/L 28 26 25   Calcium 8.9 - 10.3 mg/dL 8.6(L) 9.0 9.3  Total Protein 6.5 - 8.1 g/dL - - -  Total Bilirubin 0.3 - 1.2 mg/dL - - -  Alkaline Phos 38 - 126 U/L - - -  AST 15 - 41 U/L - - -  ALT 0 - 44 U/L - - -   No results found for: FFM384 Lab Results  Component Value Date   WBC 4.7 03/25/2021   HGB 11.4 (L) 03/25/2021   HCT 36.4 03/25/2021   MCV 92.2 03/25/2021   PLT 195  03/25/2021   NEUTROABS 2.9 11/09/2020    ASSESSMENT:   1.  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   2.  Stage II right breast cancer: Diagnosed in 1987, status post right lumpectomy and axillary lymph node dissection at [facility].  One lymph node was positive. Adjuvant radiation therapy and tamoxifen.   3.  Thyroid cancer: Status post near total thyroidectomy.  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  No palpable adenopathy.  Stage II triple negative right breast cancer: Mammogram from 11/09/2020 showed slight increase in calcifications in the right breast.  Biopsy on 224 consistent with triple negative breast cancer.  It was recommended she have surgical resection but unfortunately she was deemed too high risk for surgery due to cardiac function.  Her EF is 30 to 35%.  She recently completed radiation and eaten about 2 weeks ago.  States tolerated radiation well.   CKD- Creatinine 1.75 with GFR 28.  Fairly stable.  Anemia- Will monitor.  Hemoglobin 11.4 today.  If she continues to have fatigue would recommend checking B12, iron, ferritin, folate and copper levels at her next visit.  Disposition- She is doing well and recovering from radiation.  Recommend follow-up in 1 to 2 months to see Dr. Delton Coombes for plan moving forward.  She is not due for a mammogram until February 2023.  I spent 25 minutes dedicated to the care of this  patient (face-to-face and non-face-to-face) on the date of the encounter to include what is described in the assessment and plan.   No orders of the defined types were placed in this encounter.  The patient has a good understanding of the overall plan. she agrees with it. she will call with any problems that may develop before the next visit here.  Christine Casa, NP 04/21/2021 1:33 PM

## 2021-06-01 ENCOUNTER — Other Ambulatory Visit: Payer: Self-pay

## 2021-06-01 ENCOUNTER — Other Ambulatory Visit (HOSPITAL_COMMUNITY)
Admission: RE | Admit: 2021-06-01 | Discharge: 2021-06-01 | Disposition: A | Discharge status: Home / Self Care | Payer: Medicare HMO | Source: Ambulatory Visit | Attending: Nephrology | Admitting: Nephrology

## 2021-06-01 DIAGNOSIS — N184 Chronic kidney disease, stage 4 (severe): Secondary | ICD-10-CM | POA: Diagnosis present

## 2021-06-01 DIAGNOSIS — I5042 Chronic combined systolic (congestive) and diastolic (congestive) heart failure: Secondary | ICD-10-CM | POA: Diagnosis present

## 2021-06-01 DIAGNOSIS — D638 Anemia in other chronic diseases classified elsewhere: Secondary | ICD-10-CM | POA: Diagnosis present

## 2021-06-01 DIAGNOSIS — I129 Hypertensive chronic kidney disease with stage 1 through stage 4 chronic kidney disease, or unspecified chronic kidney disease: Secondary | ICD-10-CM | POA: Diagnosis present

## 2021-06-01 LAB — CBC
HCT: 36.2 % (ref 36.0–46.0)
Hemoglobin: 11.2 g/dL — ABNORMAL LOW (ref 12.0–15.0)
MCH: 28.7 pg (ref 26.0–34.0)
MCHC: 30.9 g/dL (ref 30.0–36.0)
MCV: 92.8 fL (ref 80.0–100.0)
Platelets: 194 10*3/uL (ref 150–400)
RBC: 3.9 MIL/uL (ref 3.87–5.11)
RDW: 16.3 % — ABNORMAL HIGH (ref 11.5–15.5)
WBC: 5.1 10*3/uL (ref 4.0–10.5)
nRBC: 0 % (ref 0.0–0.2)

## 2021-06-01 LAB — RENAL FUNCTION PANEL
Albumin: 3.6 g/dL (ref 3.5–5.0)
Anion gap: 5 (ref 5–15)
BUN: 32 mg/dL — ABNORMAL HIGH (ref 8–23)
CO2: 27 mmol/L (ref 22–32)
Calcium: 8.1 mg/dL — ABNORMAL LOW (ref 8.9–10.3)
Chloride: 107 mmol/L (ref 98–111)
Creatinine, Ser: 1.67 mg/dL — ABNORMAL HIGH (ref 0.44–1.00)
GFR, Estimated: 29 mL/min — ABNORMAL LOW (ref >60)
Glucose, Bld: 96 mg/dL (ref 70–99)
Phosphorus: 4.5 mg/dL (ref 2.5–4.6)
Potassium: 5 mmol/L (ref 3.5–5.1)
Sodium: 139 mmol/L (ref 135–145)

## 2021-06-02 LAB — PTH, INTACT AND CALCIUM
Calcium, Total (PTH): 8.3 mg/dL — ABNORMAL LOW (ref 8.7–10.3)
PTH: 26 pg/mL (ref 15–65)

## 2021-06-06 ENCOUNTER — Other Ambulatory Visit: Payer: Self-pay | Admitting: Cardiology

## 2021-06-22 ENCOUNTER — Other Ambulatory Visit: Payer: Self-pay | Admitting: Cardiology

## 2021-06-29 ENCOUNTER — Other Ambulatory Visit: Payer: Self-pay

## 2021-06-29 ENCOUNTER — Other Ambulatory Visit (HOSPITAL_COMMUNITY)
Admission: RE | Admit: 2021-06-29 | Discharge: 2021-06-29 | Disposition: A | Discharge status: Home / Self Care | Payer: Medicare HMO | Source: Ambulatory Visit | Attending: Nurse Practitioner | Admitting: Nurse Practitioner

## 2021-06-29 DIAGNOSIS — E890 Postprocedural hypothyroidism: Secondary | ICD-10-CM | POA: Diagnosis present

## 2021-06-29 DIAGNOSIS — Z853 Personal history of malignant neoplasm of breast: Secondary | ICD-10-CM | POA: Insufficient documentation

## 2021-06-29 DIAGNOSIS — N183 Chronic kidney disease, stage 3 unspecified: Secondary | ICD-10-CM | POA: Diagnosis present

## 2021-06-29 DIAGNOSIS — I10 Essential (primary) hypertension: Secondary | ICD-10-CM | POA: Diagnosis present

## 2021-06-29 DIAGNOSIS — I509 Heart failure, unspecified: Secondary | ICD-10-CM | POA: Diagnosis present

## 2021-06-29 LAB — COMPREHENSIVE METABOLIC PANEL
ALT: 22 U/L (ref 0–44)
AST: 24 U/L (ref 15–41)
Albumin: 3.9 g/dL (ref 3.5–5.0)
Alkaline Phosphatase: 48 U/L (ref 38–126)
Anion gap: 8 (ref 5–15)
BUN: 36 mg/dL — ABNORMAL HIGH (ref 8–23)
CO2: 29 mmol/L (ref 22–32)
Calcium: 8.7 mg/dL — ABNORMAL LOW (ref 8.9–10.3)
Chloride: 103 mmol/L (ref 98–111)
Creatinine, Ser: 1.87 mg/dL — ABNORMAL HIGH (ref 0.44–1.00)
GFR, Estimated: 26 mL/min — ABNORMAL LOW (ref >60)
Glucose, Bld: 94 mg/dL (ref 70–99)
Potassium: 4.6 mmol/L (ref 3.5–5.1)
Sodium: 140 mmol/L (ref 135–145)
Total Bilirubin: 0.9 mg/dL (ref 0.3–1.2)
Total Protein: 7.5 g/dL (ref 6.5–8.1)

## 2021-06-29 LAB — CBC WITH DIFFERENTIAL/PLATELET
Abs Immature Granulocytes: 0.02 10*3/uL (ref 0.00–0.07)
Basophils Absolute: 0 10*3/uL (ref 0.0–0.1)
Basophils Relative: 1 %
Eosinophils Absolute: 0.1 10*3/uL (ref 0.0–0.5)
Eosinophils Relative: 2 %
HCT: 37.3 % (ref 36.0–46.0)
Hemoglobin: 11.8 g/dL — ABNORMAL LOW (ref 12.0–15.0)
Immature Granulocytes: 0 %
Lymphocytes Relative: 28 %
Lymphs Abs: 1.6 10*3/uL (ref 0.7–4.0)
MCH: 29.2 pg (ref 26.0–34.0)
MCHC: 31.6 g/dL (ref 30.0–36.0)
MCV: 92.3 fL (ref 80.0–100.0)
Monocytes Absolute: 0.7 10*3/uL (ref 0.1–1.0)
Monocytes Relative: 12 %
Neutro Abs: 3.3 10*3/uL (ref 1.7–7.7)
Neutrophils Relative %: 57 %
Platelets: 213 10*3/uL (ref 150–400)
RBC: 4.04 MIL/uL (ref 3.87–5.11)
RDW: 16.2 % — ABNORMAL HIGH (ref 11.5–15.5)
WBC: 5.7 10*3/uL (ref 4.0–10.5)
nRBC: 0 % (ref 0.0–0.2)

## 2021-06-29 LAB — TSH: TSH: 2.446 u[IU]/mL (ref 0.350–4.500)

## 2021-06-29 LAB — LIPID PANEL
Cholesterol: 150 mg/dL (ref 0–200)
HDL: 70 mg/dL (ref >40)
LDL Cholesterol: 65 mg/dL (ref 0–99)
Total CHOL/HDL Ratio: 2.1 RATIO
Triglycerides: 74 mg/dL (ref <150)
VLDL: 15 mg/dL (ref 0–40)

## 2021-07-14 ENCOUNTER — Inpatient Hospital Stay (HOSPITAL_COMMUNITY): Payer: Medicare HMO | Attending: Hematology

## 2021-07-14 ENCOUNTER — Other Ambulatory Visit: Payer: Self-pay

## 2021-07-14 DIAGNOSIS — N189 Chronic kidney disease, unspecified: Secondary | ICD-10-CM | POA: Diagnosis not present

## 2021-07-14 DIAGNOSIS — Z923 Personal history of irradiation: Secondary | ICD-10-CM | POA: Diagnosis not present

## 2021-07-14 DIAGNOSIS — Z8571 Personal history of Hodgkin lymphoma: Secondary | ICD-10-CM | POA: Insufficient documentation

## 2021-07-14 DIAGNOSIS — Z853 Personal history of malignant neoplasm of breast: Secondary | ICD-10-CM | POA: Diagnosis present

## 2021-07-14 LAB — CBC WITH DIFFERENTIAL/PLATELET
Abs Immature Granulocytes: 0.03 10*3/uL (ref 0.00–0.07)
Basophils Absolute: 0 10*3/uL (ref 0.0–0.1)
Basophils Relative: 1 %
Eosinophils Absolute: 0.1 10*3/uL (ref 0.0–0.5)
Eosinophils Relative: 3 %
HCT: 35.2 % — ABNORMAL LOW (ref 36.0–46.0)
Hemoglobin: 11 g/dL — ABNORMAL LOW (ref 12.0–15.0)
Immature Granulocytes: 1 %
Lymphocytes Relative: 23 %
Lymphs Abs: 1.2 10*3/uL (ref 0.7–4.0)
MCH: 29.1 pg (ref 26.0–34.0)
MCHC: 31.3 g/dL (ref 30.0–36.0)
MCV: 93.1 fL (ref 80.0–100.0)
Monocytes Absolute: 0.6 10*3/uL (ref 0.1–1.0)
Monocytes Relative: 11 %
Neutro Abs: 3.3 10*3/uL (ref 1.7–7.7)
Neutrophils Relative %: 61 %
Platelets: 201 10*3/uL (ref 150–400)
RBC: 3.78 MIL/uL — ABNORMAL LOW (ref 3.87–5.11)
RDW: 16 % — ABNORMAL HIGH (ref 11.5–15.5)
WBC: 5.3 10*3/uL (ref 4.0–10.5)
nRBC: 0 % (ref 0.0–0.2)

## 2021-07-14 LAB — COMPREHENSIVE METABOLIC PANEL
ALT: 17 U/L (ref 0–44)
AST: 21 U/L (ref 15–41)
Albumin: 3.6 g/dL (ref 3.5–5.0)
Alkaline Phosphatase: 46 U/L (ref 38–126)
Anion gap: 9 (ref 5–15)
BUN: 39 mg/dL — ABNORMAL HIGH (ref 8–23)
CO2: 30 mmol/L (ref 22–32)
Calcium: 9 mg/dL (ref 8.9–10.3)
Chloride: 100 mmol/L (ref 98–111)
Creatinine, Ser: 1.98 mg/dL — ABNORMAL HIGH (ref 0.44–1.00)
GFR, Estimated: 24 mL/min — ABNORMAL LOW (ref >60)
Glucose, Bld: 135 mg/dL — ABNORMAL HIGH (ref 70–99)
Potassium: 4.6 mmol/L (ref 3.5–5.1)
Sodium: 139 mmol/L (ref 135–145)
Total Bilirubin: 0.8 mg/dL (ref 0.3–1.2)
Total Protein: 7.3 g/dL (ref 6.5–8.1)

## 2021-07-19 ENCOUNTER — Encounter: Payer: Self-pay | Admitting: Cardiology

## 2021-07-19 ENCOUNTER — Ambulatory Visit: Payer: Medicare HMO | Admitting: Cardiology

## 2021-07-19 VITALS — BP 128/78 | HR 60 | Ht 69.0 in | Wt 184.4 lb

## 2021-07-19 DIAGNOSIS — I5020 Unspecified systolic (congestive) heart failure: Secondary | ICD-10-CM | POA: Diagnosis not present

## 2021-07-19 DIAGNOSIS — I5022 Chronic systolic (congestive) heart failure: Secondary | ICD-10-CM

## 2021-07-19 DIAGNOSIS — I428 Other cardiomyopathies: Secondary | ICD-10-CM

## 2021-07-19 MED ORDER — AMIODARONE HCL 200 MG PO TABS: 100.0000 mg | ORAL_TABLET | Freq: Every day | ORAL | 3 refills | Status: DC

## 2021-07-19 NOTE — Patient Instructions (Addendum)
Medication Instructions:  Your physician has recommended you make the following change in your medication:  Start amiodarone 100 mg daily Continue other medications the same  Labwork: none  Testing/Procedures: none  Follow-Up: Your physician recommends that you schedule a follow-up appointment in: 3 months at the [place] office.  Any Other Special Instructions Will Be Listed Below (If Applicable).  If you need a refill on your cardiac medications before your next appointment, please call your pharmacy.

## 2021-07-19 NOTE — Progress Notes (Signed)
Cardiology Office Note  Date: 07/19/2021   ID: [name], DOB 09/04/1930, MRN [id number]  PCP:  Lemmie Evens, MD  Cardiologist:  Rozann Lesches, MD Electrophysiologist:  None   Chief Complaint  Patient presents with   Cardiac follow-up    History of Present Illness: [name] is an 87 y.o. female last seen in July.  She is here for a routine visit.  She tells me that she had a fall at home, not entirely clear that she tripped, may have lost consciousness but if she is not certain about that either.  She was up and had fixed a sandwich in her kitchen when this happened.  Does not recall any chest pain or palpitations at that time.  She had another episode when she was at the beauty shop getting her hair done, felt very sweaty and lightheaded for a period of time and then this went away spontaneously.  She states that she has been taking her medications as directed.  Blood pressure and heart rate are stable today.  Her son has encouraged her to eat more regularly during the daytime, she usually tends to skip midday meal.  I talked with her about adding low-dose amiodarone in case she is having any ventricular arrhythmias.  She has already declined invasive work-up and ICD which I think is reasonable.  Past Medical History:  Diagnosis Date   Asthma    Breast cancer, right breast ([place])    CKD (chronic kidney disease), stage III ([place])    Coronary atherosclerosis    a. cardiac catheterization in 2004 with 60% mid LAD b. nonobstructive disease by cath in 07/2018 at time of NSTEMI   DVT (deep venous thrombosis) (H[facility])    Essential hypertension    History of breast cancer 1987   Right-sided, mastectomy with axillary node dissection and Adriamycin    History of thyroid cancer    Hodgkin lymphoma ([place]) 04/03/2017   Hodgkin's disease ([place])    Hypothyroidism    Nonischemic cardiomyopathy ([place])    a. EF 35-40% by echo in 07/2018   NSTEMI (non-ST elevated myocardial  infarction) ([place]) 07/27/2018   Obesity    Osteoporosis    Peripheral arterial disease ([place])    Stent to the left common iliac in 2006   Thyroid carcinoma ([place]) 04/03/2017    Past Surgical History:  Procedure Laterality Date   ABDOMINAL HYSTERECTOMY     ANGIOPLASTY / STENTING FEMORAL Left 07/13/2005   APPENDECTOMY     BUNIONECTOMY Right    CATARACT EXTRACTION W/PHACO  05/28/2012   Procedure: CATARACT EXTRACTION PHACO AND INTRAOCULAR LENS PLACEMENT ([place]);  Surgeon: Elta Guadeloupe T. Gershon Crane, MD;  Location: [facility];  Service: Ophthalmology;  Laterality: Right;  CDE 12.86   CATARACT EXTRACTION W/PHACO  06/04/2012   Procedure: CATARACT EXTRACTION PHACO AND INTRAOCULAR LENS PLACEMENT ([facility]);  Surgeon: Elta Guadeloupe T. Gershon Crane, MD;  Location: [facility];  Service: Ophthalmology;  Laterality: Left;  CDE:15.32   COLONOSCOPY     LEFT HEART CATH AND CORONARY ANGIOGRAPHY N/A 07/30/2018   Procedure: LEFT HEART CATH AND CORONARY ANGIOGRAPHY;  Surgeon: Belva Crome, MD;  Location: [facility];  Service: Cardiovascular;  Laterality: N/A;   MASTECTOMY PARTIAL / LUMPECTOMY W/ AXILLARY LYMPHADENECTOMY Right 1987   THYROIDECTOMY     TONSILLECTOMY     YAG LASER APPLICATION Right 25/85/2778   Procedure: YAG LASER APPLICATION;  Surgeon: Rutherford Guys, MD;  Location: [facility];  Service: Ophthalmology;  Laterality: Right;   YAG  LASER APPLICATION Left 04/27/1516   Procedure: YAG LASER APPLICATION;  Surgeon: Rutherford Guys, MD;  Location: [facility];  Service: Ophthalmology;  Laterality: Left;    Current Outpatient Medications  Medication Sig Dispense Refill   amiodarone (PACERONE) 200 MG tablet Take 0.5 tablets (100 mg total) by mouth daily. 15 tablet 3   aspirin 81 MG tablet Take 81 mg by mouth daily.     atorvastatin (LIPITOR) 80 MG tablet Take 1 tablet by mouth once daily 90 tablet 2   calcium-vitamin D (OSCAL WITH D) 500-200 MG-UNIT tablet Take 1 tablet by mouth 3 (three) times daily. 90 tablet 1   carvedilol (COREG) 6.25 MG tablet  Take 1 tablet (6.25 mg total) by mouth 2 (two) times daily with a meal. 180 tablet 3   clopidogrel (PLAVIX) 75 MG tablet Take 1 tablet by mouth once daily 90 tablet 0   FLOVENT HFA 44 MCG/ACT inhaler Inhale 2 puffs into the lungs 2 (two) times daily.  11   furosemide (LASIX) 20 MG tablet Take 1 tablet (20 mg total) by mouth every morning. 90 tablet 1   hydrALAZINE (APRESOLINE) 10 MG tablet Take 1 tablet (10 mg total) by mouth in the morning and at bedtime. 180 tablet 3   isosorbide mononitrate (IMDUR) 30 MG 24 hr tablet Take 0.5 tablets (15 mg total) by mouth daily. 45 tablet 3   levothyroxine (SYNTHROID, LEVOTHROID) 100 MCG tablet Take 100 mcg by mouth daily.     Multiple Vitamin (MULTI-VITAMIN) tablet Take 1 tablet by mouth daily.     Multiple Vitamins-Calcium (ONE-A-DAY WOMENS PO) Take 1 tablet by mouth daily.     nitroGLYCERIN (NITROSTAT) 0.4 MG SL tablet Place 1 tablet (0.4 mg total) under the tongue every 5 (five) minutes x 3 doses as needed for chest pain. 25 tablet 1   SSD 1 % cream      temazepam (RESTORIL) 30 MG capsule Take 30 mg by mouth at bedtime.     vitamin C (ASCORBIC ACID) 500 MG tablet Take 500 mg by mouth daily.     zolpidem (AMBIEN) 5 MG tablet Take 2.5-5 mg by mouth at bedtime.     albuterol (VENTOLIN HFA) 108 (90 Base) MCG/ACT inhaler Inhale into the lungs.     spironolactone (ALDACTONE) 25 MG tablet Take 0.5 tablets (12.5 mg total) by mouth daily. 45 tablet 3   No current facility-administered medications for this visit.   Allergies:  Patient has no known allergies.   ROS: No orthopnea or PND, no leg swelling.  Physical Exam: VS:  BP 128/78 (BP Location: Left Arm, Patient Position: Sitting, Cuff Size: Normal)   Pulse 60   Ht 5\' 9"  (1.753 m)   Wt 184 lb 6.4 oz (83.6 kg)   SpO2 98%   BMI 27.23 kg/m , BMI Body mass index is 27.23 kg/m.  Wt Readings from Last 3 Encounters:  07/19/21 184 lb 6.4 oz (83.6 kg)  04/14/21 181 lb 11.2 oz (82.4 kg)  04/12/21 182 lb 3.2  oz (82.6 kg)    General: Patient appears comfortable at rest. HEENT: Conjunctiva and lids normal, wearing a mask. Neck: Supple, no elevated JVP or carotid bruits, no thyromegaly. Lungs: Clear to auscultation, nonlabored breathing at rest. Cardiac: Regular rate and rhythm, no S3, 1/6 systolic murmur. Extremities: No pitting edema.  ECG:  An ECG dated 12/07/2020 was personally reviewed today and demonstrated:  Sinus rhythm with PVCs, nonspecific T wave changes.  Recent Labwork: 09/13/2020: B Natriuretic Peptide 491.0 06/29/2021:  TSH 2.446 07/14/2021: ALT 17; AST 21; BUN 39; Creatinine, Ser 1.98; Hemoglobin 11.0; Platelets 201; Potassium 4.6; Sodium 139     Component Value Date/Time   CHOL 150 06/29/2021 1044   TRIG 74 06/29/2021 1044   HDL 70 06/29/2021 1044   CHOLHDL 2.1 06/29/2021 1044   VLDL 15 06/29/2021 1044   LDLCALC 65 06/29/2021 1044    Other Studies Reviewed Today:  Echocardiogram 12/08/2020:  1. Compared to report from November 2020, no significant change in LVEF  or MR.   2. LVEF is depressed with diffuse hypokinesis, worse in the inferior,  inferoseptal walls; inferolateral akinesis. . Left ventricular ejection  fraction, by estimation, is 35%%. The left ventricle has moderately  decreased function. The left ventricular  internal cavity size was mildly dilated. Indeterminate diastolic filling  due to E-A fusion.   3. Right ventricular systolic function is normal. The right ventricular  size is normal. There is normal pulmonary artery systolic pressure.   4. Left atrial size was mildly dilated.   5. There appear to be at least 2 jets of MR . The mitral valve is  degenerative. Moderate mitral valve regurgitation.   6. The aortic valve is tricuspid. Aortic valve regurgitation is not  visualized. Mild to moderate aortic valve sclerosis/calcification is  present, without any evidence of aortic stenosis.   7. The inferior vena cava is dilated in size with <50% respiratory   variability, suggesting right atrial pressure of 15 mmHg.   Assessment and Plan:  1.  HFrEF with nonischemic cardiomyopathy and chronic systolic heart failure with LVEF approximately 35%.  We are managing conservatively, she has declined invasive work-up and ICD which is reasonable.  Continue Coreg, Aldactone, hydralazine, Lasix and Imdur.  She could not take Entresto due to worsening renal failure.  Not an optimal candidate for SGLT2 inhibitor for similar reasons.  Plan to add low-dose amiodarone to suppress ventricular arrhythmias particular in light of her spells discussed above.  2.  CKD stage IIIb-IV.  She continues to follow with Dr. Theador Hawthorne.  Recent creatinine 1.98.  Medication Adjustments/Labs and Tests Ordered: Current medicines are reviewed at length with the patient today.  Concerns regarding medicines are outlined above.   Tests Ordered: No orders of the defined types were placed in this encounter.   Medication Changes: Meds ordered this encounter  Medications   amiodarone (PACERONE) 200 MG tablet    Sig: Take 0.5 tablets (100 mg total) by mouth daily.    Dispense:  15 tablet    Refill:  3    07/19/2021 NEW     Disposition:  Follow up  3 months.  Signed, Satira Sark, MD, [facility] 07/19/2021 3:45 PM    [facility] [place] [facility] [place], [place], [place] [address] Phone: [phone]; Fax: [phone]

## 2021-07-20 NOTE — Progress Notes (Signed)
[facility] [address] [place] [address]   Patient Care Team: Lemmie Evens, MD as PCP - General (Family Medicine) Satira Sark, MD as PCP - Cardiology (Cardiology)  SUMMARY OF ONCOLOGIC HISTORY: Oncology History   No history exists.    CHIEF COMPLIANT: Follow-up for history of right breast cancer and Hodgkin's lymphoma   INTERVAL HISTORY: [name] is a 87 y.o. female here today for follow up of her right breast cancer and Hodgkin's lymphoma. Her last visit was on 01/13/2021.   Today she reports feeling good. She fell 2 weeks ago and reports she has felt sore in her left flank since. She denies fevers and night sweats.   REVIEW OF SYSTEMS:   Review of Systems  Constitutional:  Negative for appetite change, fatigue and fever.  Respiratory:  Positive for cough and shortness of breath.   Musculoskeletal:  Positive for flank pain (L side).  All other systems reviewed and are negative.  I have reviewed the past medical history, past surgical history, social history and family history with the patient and they are unchanged from previous note.   ALLERGIES:   has No Known Allergies.   MEDICATIONS:  Current Outpatient Medications  Medication Sig Dispense Refill   albuterol (VENTOLIN HFA) 108 (90 Base) MCG/ACT inhaler Inhale into the lungs.     amiodarone (PACERONE) 200 MG tablet Take 0.5 tablets (100 mg total) by mouth daily. 15 tablet 3   aspirin 81 MG tablet Take 81 mg by mouth daily.     atorvastatin (LIPITOR) 80 MG tablet Take 1 tablet by mouth once daily 90 tablet 2   calcium-vitamin D (OSCAL WITH D) 500-200 MG-UNIT tablet Take 1 tablet by mouth 3 (three) times daily. 90 tablet 1   carvedilol (COREG) 6.25 MG tablet Take 1 tablet (6.25 mg total) by mouth 2 (two) times daily with a meal. 180 tablet 3   clopidogrel (PLAVIX) 75 MG tablet Take 1 tablet by mouth once daily 90 tablet 0   FLOVENT HFA 44 MCG/ACT inhaler Inhale 2 puffs  into the lungs 2 (two) times daily.  11   furosemide (LASIX) 20 MG tablet Take 1 tablet (20 mg total) by mouth every morning. 90 tablet 1   hydrALAZINE (APRESOLINE) 10 MG tablet Take 1 tablet (10 mg total) by mouth in the morning and at bedtime. 180 tablet 3   isosorbide mononitrate (IMDUR) 30 MG 24 hr tablet Take 0.5 tablets (15 mg total) by mouth daily. 45 tablet 3   levothyroxine (SYNTHROID, LEVOTHROID) 100 MCG tablet Take 100 mcg by mouth daily.     Multiple Vitamin (MULTI-VITAMIN) tablet Take 1 tablet by mouth daily.     Multiple Vitamins-Calcium (ONE-A-DAY WOMENS PO) Take 1 tablet by mouth daily.     nitroGLYCERIN (NITROSTAT) 0.4 MG SL tablet Place 1 tablet (0.4 mg total) under the tongue every 5 (five) minutes x 3 doses as needed for chest pain. 25 tablet 1   spironolactone (ALDACTONE) 25 MG tablet Take 0.5 tablets (12.5 mg total) by mouth daily. 45 tablet 3   SSD 1 % cream      temazepam (RESTORIL) 30 MG capsule Take 30 mg by mouth at bedtime.     vitamin C (ASCORBIC ACID) 500 MG tablet Take 500 mg by mouth daily.     zolpidem (AMBIEN) 5 MG tablet Take 2.5-5 mg by mouth at bedtime.     No current facility-administered medications for this visit.  PHYSICAL EXAMINATION: Performance status (ECOG): 1 - Symptomatic but completely ambulatory  There were no vitals filed for this visit. Wt Readings from Last 3 Encounters:  07/19/21 184 lb 6.4 oz (83.6 kg)  04/14/21 181 lb 11.2 oz (82.4 kg)  04/12/21 182 lb 3.2 oz (82.6 kg)   Physical Exam Vitals reviewed.  Constitutional:      Appearance: Normal appearance.  Cardiovascular:     Rate and Rhythm: Normal rate and regular rhythm.     Pulses: Normal pulses.     Heart sounds: Normal heart sounds.  Pulmonary:     Effort: Pulmonary effort is normal.     Breath sounds: Normal breath sounds.  Chest:  Breasts:    Right: Skin change (thickening medial to the nipple) present. No mass or tenderness.     Left: Normal.  Neurological:      General: No focal deficit present.     Mental Status: She is alert and oriented to person, place, and time.  Psychiatric:        Mood and Affect: Mood normal.        Behavior: Behavior normal.    Breast Exam Chaperone: Thana Ates     LABORATORY DATA:  I have reviewed the data as listed CMP Latest Ref Rng & Units 07/14/2021 06/29/2021 06/01/2021  Glucose 70 - 99 mg/dL 135(H) 94 96  BUN 8 - 23 mg/dL 39(H) 36(H) 32(H)  Creatinine 0.44 - 1.00 mg/dL 1.98(H) 1.87(H) 1.67(H)  Sodium 135 - 145 mmol/L 139 140 139  Potassium 3.5 - 5.1 mmol/L 4.6 4.6 5.0  Chloride 98 - 111 mmol/L 100 103 107  CO2 22 - 32 mmol/L '30 29 27  ' Calcium 8.9 - 10.3 mg/dL 9.0 8.7(L) 8.1(L)  Total Protein 6.5 - 8.1 g/dL 7.3 7.5 -  Total Bilirubin 0.3 - 1.2 mg/dL 0.8 0.9 -  Alkaline Phos 38 - 126 U/L 46 48 -  AST 15 - 41 U/L 21 24 -  ALT 0 - 44 U/L 17 22 -   No results found for: XBL390 Lab Results  Component Value Date   WBC 5.3 07/14/2021   HGB 11.0 (L) 07/14/2021   HCT 35.2 (L) 07/14/2021   MCV 93.1 07/14/2021   PLT 201 07/14/2021   NEUTROABS 3.3 07/14/2021    ASSESSMENT:  1.  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   2.  Stage II right breast cancer: -Diagnosed in 1987, status post right lumpectomy and axillary lymph node dissection at [facility].  One lymph node was positive. -Adjuvant radiation therapy and tamoxifen.   3.  Thyroid cancer: -Status post near total thyroidectomy.  4.  Stage II right breast TNBC: - Mammogram on 11/09/2020 showed slight increase in calcifications in the right breast measuring 2.1 x 1.5 x 1.8 cm. - Biopsy on 11/18/2020 consistent with IDC in the lower inner quadrant of the right breast.  ER/PR was 0%.  Ki-67 30%.  HER2 is 2+ and equivocal.  HER2 by FISH was negative. - She was not felt to be a good surgical candidate given her cardiac comorbidities. - Right breast palliative radiation 3000  cGy in 10 fractions from 03/09/2021 through 03/22/2021.   PLAN:  1.  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  No infections.   2.  Stage II triple negative right breast cancer: - She has completed radiation therapy to the  right breast on 03/22/2021. - Breast examination today shows the mass is no longer palpable.  There is thickening in the right breast medial to the areola.  No lymph nodes palpable. - Recommend mammogram in February 2023. - RTC 4 months for follow-up.  We will check labs and tumor marker.   3.  CKD: - Latest creatinine is 1.98. - She will continue follow-up with Dr. Theador Hawthorne.  Breast Cancer therapy associated bone loss: I have recommended calcium, Vitamin D and weight bearing exercises.  Orders placed this encounter:  No orders of the defined types were placed in this encounter.   The patient has a good understanding of the overall plan. She agrees with it. She will call with any problems that may develop before the next visit here.  Derek Jack, MD [place] [phone]   I, Thana Ates, am acting as a scribe for Dr. Derek Jack.  I, Derek Jack MD, have reviewed the above documentation for accuracy and completeness, and I agree with the above.

## 2021-07-21 ENCOUNTER — Inpatient Hospital Stay (HOSPITAL_COMMUNITY): Payer: Medicare HMO | Admitting: Hematology

## 2021-07-21 ENCOUNTER — Other Ambulatory Visit: Payer: Self-pay

## 2021-07-21 ENCOUNTER — Other Ambulatory Visit (HOSPITAL_COMMUNITY): Payer: Self-pay | Admitting: Hematology

## 2021-07-21 VITALS — BP 114/73 | HR 79 | Temp 97.8°F | Resp 18 | Wt 180.8 lb

## 2021-07-21 DIAGNOSIS — Z853 Personal history of malignant neoplasm of breast: Secondary | ICD-10-CM | POA: Diagnosis not present

## 2021-07-21 DIAGNOSIS — C8100 Nodular lymphocyte predominant Hodgkin lymphoma, unspecified site: Secondary | ICD-10-CM

## 2021-07-21 DIAGNOSIS — Z9889 Other specified postprocedural states: Secondary | ICD-10-CM

## 2021-07-21 NOTE — Addendum Note (Signed)
Addended by: Joie Bimler on: 07/21/2021 04:12 PM   Modules accepted: Orders

## 2021-07-21 NOTE — Patient Instructions (Signed)
[place] at [facility] Discharge Instructions  You were seen and examined today by Dr. Delton Coombes. He reviewed your lab work with you which was normal.  You will have a mammogram in February. Return as scheduled after mammogram.    Thank you for choosing [place] at [facility] to provide your oncology and hematology care.  To afford each patient quality time with our provider, please arrive at least 15 minutes before your scheduled appointment time.   If you have a lab appointment with the [facility] please come in thru the Main Entrance and check in at the main information desk.  You need to re-schedule your appointment should you arrive 10 or more minutes late.  We strive to give you quality time with our providers, and arriving late affects you and other patients whose appointments are after yours.  Also, if you no show three or more times for appointments you may be dismissed from the clinic at the providers discretion.     Again, thank you for choosing [facility].  Our hope is that these requests will decrease the amount of time that you wait before being seen by our physicians.       _____________________________________________________________  Should you have questions after your visit to [facility] please contact our office at [phone] and follow the prompts.  Our office hours are 8:00 a.m. and 4:30 p.m. Monday - Friday.  Please note that voicemails left after 4:00 p.m. may not be returned until the following business day.  We are closed weekends and major holidays.  You do have access to a nurse 24-7, just call the main number to the clinic [phone] and do not press any options, hold on the line and a nurse will answer the phone.    For prescription refill requests, have your pharmacy contact our office and allow 72 hours.    Due to Covid, you will need to wear a mask upon entering the  hospital. If you do not have a mask, a mask will be given to you at the Main Entrance upon arrival. For doctor visits, patients may have 1 support person age 77 or older with them. For treatment visits, patients can not have anyone with them due to social distancing guidelines and our immunocompromised population.

## 2021-07-26 ENCOUNTER — Encounter (HOSPITAL_COMMUNITY): Payer: Self-pay | Admitting: *Deleted

## 2021-09-12 ENCOUNTER — Other Ambulatory Visit: Payer: Self-pay | Admitting: Cardiology

## 2021-10-05 ENCOUNTER — Other Ambulatory Visit: Payer: Self-pay

## 2021-10-05 ENCOUNTER — Other Ambulatory Visit (HOSPITAL_COMMUNITY)
Admission: RE | Admit: 2021-10-05 | Discharge: 2021-10-05 | Disposition: A | Discharge status: Home / Self Care | Payer: Medicare HMO | Source: Ambulatory Visit | Attending: Nephrology | Admitting: Nephrology

## 2021-10-05 DIAGNOSIS — N184 Chronic kidney disease, stage 4 (severe): Secondary | ICD-10-CM | POA: Diagnosis present

## 2021-10-05 DIAGNOSIS — D638 Anemia in other chronic diseases classified elsewhere: Secondary | ICD-10-CM | POA: Diagnosis present

## 2021-10-05 DIAGNOSIS — I5042 Chronic combined systolic (congestive) and diastolic (congestive) heart failure: Secondary | ICD-10-CM | POA: Diagnosis present

## 2021-10-05 DIAGNOSIS — I129 Hypertensive chronic kidney disease with stage 1 through stage 4 chronic kidney disease, or unspecified chronic kidney disease: Secondary | ICD-10-CM | POA: Insufficient documentation

## 2021-10-05 LAB — IRON AND TIBC
Iron: 83 ug/dL (ref 28–170)
Saturation Ratios: 22 % (ref 10.4–31.8)
TIBC: 378 ug/dL (ref 250–450)
UIBC: 295 ug/dL

## 2021-10-05 LAB — FERRITIN: Ferritin: 33 ng/mL (ref 11–307)

## 2021-10-12 ENCOUNTER — Other Ambulatory Visit (HOSPITAL_COMMUNITY)
Admission: RE | Admit: 2021-10-12 | Discharge: 2021-10-12 | Disposition: A | Discharge status: Home / Self Care | Payer: Medicare HMO | Source: Ambulatory Visit | Attending: Nephrology | Admitting: Nephrology

## 2021-10-12 DIAGNOSIS — I129 Hypertensive chronic kidney disease with stage 1 through stage 4 chronic kidney disease, or unspecified chronic kidney disease: Secondary | ICD-10-CM | POA: Diagnosis present

## 2021-10-12 DIAGNOSIS — D638 Anemia in other chronic diseases classified elsewhere: Secondary | ICD-10-CM | POA: Insufficient documentation

## 2021-10-12 DIAGNOSIS — N184 Chronic kidney disease, stage 4 (severe): Secondary | ICD-10-CM | POA: Insufficient documentation

## 2021-10-12 DIAGNOSIS — I5042 Chronic combined systolic (congestive) and diastolic (congestive) heart failure: Secondary | ICD-10-CM | POA: Diagnosis present

## 2021-10-12 LAB — PROTEIN / CREATININE RATIO, URINE
Creatinine, Urine: 289.26 mg/dL
Protein Creatinine Ratio: 0.08 mg/mg{Cre} (ref 0.00–0.15)
Total Protein, Urine: 23 mg/dL

## 2021-10-12 LAB — RENAL FUNCTION PANEL
Albumin: 4 g/dL (ref 3.5–5.0)
Anion gap: 11 (ref 5–15)
BUN: 33 mg/dL — ABNORMAL HIGH (ref 8–23)
CO2: 29 mmol/L (ref 22–32)
Calcium: 8.5 mg/dL — ABNORMAL LOW (ref 8.9–10.3)
Chloride: 100 mmol/L (ref 98–111)
Creatinine, Ser: 1.99 mg/dL — ABNORMAL HIGH (ref 0.44–1.00)
GFR, Estimated: 24 mL/min — ABNORMAL LOW (ref >60)
Glucose, Bld: 113 mg/dL — ABNORMAL HIGH (ref 70–99)
Phosphorus: 4.5 mg/dL (ref 2.5–4.6)
Potassium: 4.5 mmol/L (ref 3.5–5.1)
Sodium: 140 mmol/L (ref 135–145)

## 2021-10-12 LAB — CBC
HCT: 40.3 % (ref 36.0–46.0)
Hemoglobin: 12.8 g/dL (ref 12.0–15.0)
MCH: 30 pg (ref 26.0–34.0)
MCHC: 31.8 g/dL (ref 30.0–36.0)
MCV: 94.4 fL (ref 80.0–100.0)
Platelets: 186 10*3/uL (ref 150–400)
RBC: 4.27 MIL/uL (ref 3.87–5.11)
RDW: 16.1 % — ABNORMAL HIGH (ref 11.5–15.5)
WBC: 5.1 10*3/uL (ref 4.0–10.5)
nRBC: 0 % (ref 0.0–0.2)

## 2021-11-01 ENCOUNTER — Ambulatory Visit: Payer: Medicare HMO | Admitting: Cardiology

## 2021-11-01 ENCOUNTER — Other Ambulatory Visit: Payer: Self-pay

## 2021-11-01 ENCOUNTER — Encounter: Payer: Self-pay | Admitting: Cardiology

## 2021-11-01 VITALS — BP 122/78 | HR 72 | Ht 69.0 in | Wt 184.0 lb

## 2021-11-01 DIAGNOSIS — I5020 Unspecified systolic (congestive) heart failure: Secondary | ICD-10-CM

## 2021-11-01 DIAGNOSIS — N184 Chronic kidney disease, stage 4 (severe): Secondary | ICD-10-CM | POA: Diagnosis not present

## 2021-11-01 DIAGNOSIS — I340 Nonrheumatic mitral (valve) insufficiency: Secondary | ICD-10-CM | POA: Diagnosis not present

## 2021-11-01 MED ORDER — FUROSEMIDE 20 MG PO TABS: 20.0000 mg | ORAL_TABLET | Freq: Every morning | ORAL | 2 refills | Status: DC

## 2021-11-01 NOTE — Progress Notes (Signed)
Cardiology Office Note  Date: 11/01/2021   ID: MICALAH CABEZAS, DOB 1930-03-15, MRN [id number]  PCP:  Lemmie Evens, MD  Cardiologist:  Rozann Lesches, MD Electrophysiologist:  None   Chief Complaint  Patient presents with   Cardiac follow-up    History of Present Illness: [name] is a 88 y.o. female last seen in October 2022.  She is here for a follow-up visit.  She reports NYHA class II dyspnea with basic activities, is fairly sedentary at this point.  She does not report any angina symptoms, no palpitations or syncope.  Her weight is up about 4 pounds.  We went over her medication list, somehow, she is no longer on Lasix which she had been previously taking at 20 mg daily.  The remainder of her regimen is stable.  She continues to follow with Dr. Theador Hawthorne, her most recent creatinine was 1.99 with potassium 5.4.  I personally reviewed her ECG today which shows sinus rhythm with PVC, nonspecific T wave changes  Past Medical History:  Diagnosis Date   Asthma    Breast cancer, right breast ([place])    CKD (chronic kidney disease), stage III (HCC)    Coronary atherosclerosis    a. cardiac catheterization in 2004 with 60% mid LAD b. nonobstructive disease by cath in 07/2018 at time of NSTEMI   DVT (deep venous thrombosis) (HCC)    Essential hypertension    History of breast cancer 1987   Right-sided, mastectomy with axillary node dissection and Adriamycin    History of thyroid cancer    Hodgkin lymphoma ([place]) 04/03/2017   Hodgkin's disease ([place])    Hypothyroidism    Nonischemic cardiomyopathy ([place])    a. EF 35-40% by echo in 07/2018   NSTEMI (non-ST elevated myocardial infarction) ([place]) 07/27/2018   Obesity    Osteoporosis    Peripheral arterial disease ([place])    Stent to the left common iliac in 2006   Thyroid carcinoma ([place]) 04/03/2017    Past Surgical History:  Procedure Laterality Date   ABDOMINAL HYSTERECTOMY     ANGIOPLASTY / STENTING FEMORAL Left  07/13/2005   APPENDECTOMY     BUNIONECTOMY Right    CATARACT EXTRACTION W/PHACO  05/28/2012   Procedure: CATARACT EXTRACTION PHACO AND INTRAOCULAR LENS PLACEMENT ([place]);  Surgeon: Elta Guadeloupe T. Gershon Crane, MD;  Location: [facility];  Service: Ophthalmology;  Laterality: Right;  CDE 12.86   CATARACT EXTRACTION W/PHACO  06/04/2012   Procedure: CATARACT EXTRACTION PHACO AND INTRAOCULAR LENS PLACEMENT ([facility]);  Surgeon: Elta Guadeloupe T. Gershon Crane, MD;  Location: [facility];  Service: Ophthalmology;  Laterality: Left;  CDE:15.32   COLONOSCOPY     LEFT HEART CATH AND CORONARY ANGIOGRAPHY N/A 07/30/2018   Procedure: LEFT HEART CATH AND CORONARY ANGIOGRAPHY;  Surgeon: Belva Crome, MD;  Location: [place] [facility];  Service: Cardiovascular;  Laterality: N/A;   MASTECTOMY PARTIAL / LUMPECTOMY W/ AXILLARY LYMPHADENECTOMY Right 1987   THYROIDECTOMY     TONSILLECTOMY     YAG LASER APPLICATION Right 99/24/2683   Procedure: YAG LASER APPLICATION;  Surgeon: Rutherford Guys, MD;  Location: [facility];  Service: Ophthalmology;  Laterality: Right;   YAG LASER APPLICATION Left 12/24/9620   Procedure: YAG LASER APPLICATION;  Surgeon: Rutherford Guys, MD;  Location: [facility];  Service: Ophthalmology;  Laterality: Left;    Current Outpatient Medications  Medication Sig Dispense Refill   albuterol (VENTOLIN HFA) 108 (90 Base) MCG/ACT inhaler Inhale into the lungs.     amiodarone (PACERONE) 200 MG  tablet Take 0.5 tablets (100 mg total) by mouth daily. 15 tablet 3   aspirin 81 MG tablet Take 81 mg by mouth daily.     atorvastatin (LIPITOR) 80 MG tablet Take 1 tablet by mouth once daily 90 tablet 2   calcium-vitamin D (OSCAL WITH D) 500-200 MG-UNIT tablet Take 1 tablet by mouth 3 (three) times daily. 90 tablet 1   carvedilol (COREG) 6.25 MG tablet Take 1 tablet (6.25 mg total) by mouth 2 (two) times daily with a meal. 180 tablet 3   clopidogrel (PLAVIX) 75 MG tablet Take 1 tablet by mouth once daily 90 tablet 2   FLOVENT HFA 44 MCG/ACT inhaler Inhale 2  puffs into the lungs 2 (two) times daily.  11   furosemide (LASIX) 20 MG tablet Take 1 tablet (20 mg total) by mouth every morning. 90 tablet 2   hydrALAZINE (APRESOLINE) 10 MG tablet Take 1 tablet (10 mg total) by mouth in the morning and at bedtime. 180 tablet 3   isosorbide mononitrate (IMDUR) 30 MG 24 hr tablet Take 0.5 tablets (15 mg total) by mouth daily. 45 tablet 3   levothyroxine (SYNTHROID, LEVOTHROID) 100 MCG tablet Take 100 mcg by mouth daily.     Multiple Vitamin (MULTI-VITAMIN) tablet Take 1 tablet by mouth daily.     Multiple Vitamins-Calcium (ONE-A-DAY WOMENS PO) Take 1 tablet by mouth daily.     nitroGLYCERIN (NITROSTAT) 0.4 MG SL tablet Place 1 tablet (0.4 mg total) under the tongue every 5 (five) minutes x 3 doses as needed for chest pain. 25 tablet 1   spironolactone (ALDACTONE) 25 MG tablet Take 0.5 tablets (12.5 mg total) by mouth daily. 45 tablet 3   vitamin C (ASCORBIC ACID) 500 MG tablet Take 500 mg by mouth daily.     SSD 1 % cream  (Patient not taking: Reported on 11/01/2021)     temazepam (RESTORIL) 30 MG capsule Take 30 mg by mouth at bedtime. (Patient not taking: Reported on 11/01/2021)     zolpidem (AMBIEN) 5 MG tablet Take 2.5-5 mg by mouth at bedtime. (Patient not taking: Reported on 11/01/2021)     No current facility-administered medications for this visit.   Allergies:  Patient has no known allergies.   ROS: No syncope.  No orthopnea or PND.  Physical Exam: VS:  BP 122/78    Pulse 72    Ht 5\' 9"  (1.753 m)    Wt 184 lb (83.5 kg)    SpO2 97%    BMI 27.17 kg/m , BMI Body mass index is 27.17 kg/m.  Wt Readings from Last 3 Encounters:  11/01/21 184 lb (83.5 kg)  07/21/21 180 lb 12.4 oz (82 kg)  07/19/21 184 lb 6.4 oz (83.6 kg)    General: Patient appears comfortable at rest. HEENT: Conjunctiva and lids normal, wearing a mask. Neck: Supple, no elevated JVP or carotid bruits, no thyromegaly. Lungs: Clear to auscultation, nonlabored breathing at rest. Cardiac:  Regular rate and rhythm, no S3, 1/6 systolic murmur. Extremities: No pitting edema.  ECG:  An ECG dated 12/07/2020 was personally reviewed today and demonstrated:  Sinus rhythm with PVCs, nonspecific T wave changes.  Recent Labwork: 06/29/2021: TSH 2.446 07/14/2021: ALT 17; AST 21 10/12/2021: BUN 33; Creatinine, Ser 1.99; Hemoglobin 12.8; Platelets 186; Potassium 4.5; Sodium 140     Component Value Date/Time   CHOL 150 06/29/2021 1044   TRIG 74 06/29/2021 1044   HDL 70 06/29/2021 1044   CHOLHDL 2.1 06/29/2021 1044  VLDL 15 06/29/2021 1044   LDLCALC 65 06/29/2021 1044    Other Studies Reviewed Today:  Echocardiogram 12/08/2020:  1. Compared to report from November 2020, no significant change in LVEF  or MR.   2. LVEF is depressed with diffuse hypokinesis, worse in the inferior,  inferoseptal walls; inferolateral akinesis. . Left ventricular ejection  fraction, by estimation, is 35%%. The left ventricle has moderately  decreased function. The left ventricular  internal cavity size was mildly dilated. Indeterminate diastolic filling  due to E-A fusion.   3. Right ventricular systolic function is normal. The right ventricular  size is normal. There is normal pulmonary artery systolic pressure.   4. Left atrial size was mildly dilated.   5. There appear to be at least 2 jets of MR . The mitral valve is  degenerative. Moderate mitral valve regurgitation.   6. The aortic valve is tricuspid. Aortic valve regurgitation is not  visualized. Mild to moderate aortic valve sclerosis/calcification is  present, without any evidence of aortic stenosis.   7. The inferior vena cava is dilated in size with <50% respiratory  variability, suggesting right atrial pressure of 15 mmHg.   Assessment and Plan:  1.  HFrEF with probable mixed cardiomyopathy, LVEF approximately 35%.  Continuing conservative medical therapy following discussions over time.  She has declined both invasive work-up and ICD.   Weight is up about 4 pounds from prior baseline.  No longer on Lasix which will be resumed at 20 mg daily.  Otherwise continue Coreg, hydralazine, Imdur, and Aldactone.  2.  CKD stage IV, most recent creatinine 1.99 with normal potassium.  She continues to follow with Dr. Theador Hawthorne.  3.  Moderate mitral regurgitation.  4.  PVCs and history of spells of near syncope.  She is on amiodarone for suppression of ventricular arrhythmias.  No interval events.  Medication Adjustments/Labs and Tests Ordered: Current medicines are reviewed at length with the patient today.  Concerns regarding medicines are outlined above.   Tests Ordered: Orders Placed This Encounter  Procedures   EKG 12-Lead    Medication Changes: Meds ordered this encounter  Medications   furosemide (LASIX) 20 MG tablet    Sig: Take 1 tablet (20 mg total) by mouth every morning.    Dispense:  90 tablet    Refill:  2    Disposition:  Follow up  3 months.  Signed, Christine Sark, MD, [facility] 11/01/2021 2:40 PM    [place] at [facility] [address] [place], [place] [address] Phone: [phone]; Fax: [phone]

## 2021-11-01 NOTE — Patient Instructions (Signed)
Medication Instructions:  ?Your physician recommends that you continue on your current medications as directed. Please refer to the Current Medication list given to you today. ? ? ?Labwork: ?None today ? ?Testing/Procedures: ?None today ? ?Follow-Up: ?3 months ? ?Any Other Special Instructions Will Be Listed Below (If Applicable). ? ?If you need a refill on your cardiac medications before your next appointment, please call your pharmacy. ? ?

## 2021-11-15 ENCOUNTER — Other Ambulatory Visit (HOSPITAL_COMMUNITY): Payer: Self-pay | Admitting: Hematology

## 2021-11-15 ENCOUNTER — Ambulatory Visit (HOSPITAL_COMMUNITY)
Admission: RE | Admit: 2021-11-15 | Discharge: 2021-11-15 | Disposition: A | Discharge status: Home / Self Care | Payer: Medicare HMO | Source: Ambulatory Visit | Attending: Hematology | Admitting: Hematology

## 2021-11-15 ENCOUNTER — Inpatient Hospital Stay (HOSPITAL_COMMUNITY): Payer: Medicare HMO | Attending: Hematology

## 2021-11-15 ENCOUNTER — Other Ambulatory Visit: Payer: Self-pay

## 2021-11-15 DIAGNOSIS — Z171 Estrogen receptor negative status [ER-]: Secondary | ICD-10-CM | POA: Diagnosis not present

## 2021-11-15 DIAGNOSIS — Z9889 Other specified postprocedural states: Secondary | ICD-10-CM

## 2021-11-15 DIAGNOSIS — C50311 Malignant neoplasm of lower-inner quadrant of right female breast: Secondary | ICD-10-CM | POA: Insufficient documentation

## 2021-11-15 DIAGNOSIS — N189 Chronic kidney disease, unspecified: Secondary | ICD-10-CM | POA: Insufficient documentation

## 2021-11-15 DIAGNOSIS — Z853 Personal history of malignant neoplasm of breast: Secondary | ICD-10-CM

## 2021-11-15 DIAGNOSIS — C8100 Nodular lymphocyte predominant Hodgkin lymphoma, unspecified site: Secondary | ICD-10-CM

## 2021-11-15 LAB — COMPREHENSIVE METABOLIC PANEL
ALT: 29 U/L (ref 0–44)
AST: 29 U/L (ref 15–41)
Albumin: 4.1 g/dL (ref 3.5–5.0)
Alkaline Phosphatase: 57 U/L (ref 38–126)
Anion gap: 9 (ref 5–15)
BUN: 51 mg/dL — ABNORMAL HIGH (ref 8–23)
CO2: 31 mmol/L (ref 22–32)
Calcium: 8.9 mg/dL (ref 8.9–10.3)
Chloride: 98 mmol/L (ref 98–111)
Creatinine, Ser: 2.71 mg/dL — ABNORMAL HIGH (ref 0.44–1.00)
GFR, Estimated: 16 mL/min — ABNORMAL LOW (ref >60)
Glucose, Bld: 99 mg/dL (ref 70–99)
Potassium: 3.9 mmol/L (ref 3.5–5.1)
Sodium: 138 mmol/L (ref 135–145)
Total Bilirubin: 1 mg/dL (ref 0.3–1.2)
Total Protein: 7.8 g/dL (ref 6.5–8.1)

## 2021-11-15 LAB — CBC WITH DIFFERENTIAL/PLATELET
Abs Immature Granulocytes: 0.01 10*3/uL (ref 0.00–0.07)
Basophils Absolute: 0 10*3/uL (ref 0.0–0.1)
Basophils Relative: 1 %
Eosinophils Absolute: 0.1 10*3/uL (ref 0.0–0.5)
Eosinophils Relative: 2 %
HCT: 38.9 % (ref 36.0–46.0)
Hemoglobin: 12.5 g/dL (ref 12.0–15.0)
Immature Granulocytes: 0 %
Lymphocytes Relative: 28 %
Lymphs Abs: 1.7 10*3/uL (ref 0.7–4.0)
MCH: 29.8 pg (ref 26.0–34.0)
MCHC: 32.1 g/dL (ref 30.0–36.0)
MCV: 92.6 fL (ref 80.0–100.0)
Monocytes Absolute: 0.5 10*3/uL (ref 0.1–1.0)
Monocytes Relative: 9 %
Neutro Abs: 3.5 10*3/uL (ref 1.7–7.7)
Neutrophils Relative %: 60 %
Platelets: 201 10*3/uL (ref 150–400)
RBC: 4.2 MIL/uL (ref 3.87–5.11)
RDW: 15 % (ref 11.5–15.5)
WBC: 5.8 10*3/uL (ref 4.0–10.5)
nRBC: 0 % (ref 0.0–0.2)

## 2021-11-16 LAB — CANCER ANTIGEN 15-3: CA 15-3: 17.3 U/mL (ref 0.0–25.0)

## 2021-11-23 ENCOUNTER — Other Ambulatory Visit: Payer: Self-pay

## 2021-11-23 ENCOUNTER — Inpatient Hospital Stay (HOSPITAL_COMMUNITY): Payer: Medicare HMO | Attending: Hematology | Admitting: Hematology

## 2021-11-23 VITALS — BP 116/77 | HR 75 | Temp 96.9°F | Resp 18 | Ht 69.0 in | Wt 187.1 lb

## 2021-11-23 DIAGNOSIS — C8100 Nodular lymphocyte predominant Hodgkin lymphoma, unspecified site: Secondary | ICD-10-CM | POA: Diagnosis not present

## 2021-11-23 DIAGNOSIS — N189 Chronic kidney disease, unspecified: Secondary | ICD-10-CM | POA: Insufficient documentation

## 2021-11-23 DIAGNOSIS — C8190 Hodgkin lymphoma, unspecified, unspecified site: Secondary | ICD-10-CM | POA: Diagnosis not present

## 2021-11-23 DIAGNOSIS — C50911 Malignant neoplasm of unspecified site of right female breast: Secondary | ICD-10-CM | POA: Diagnosis not present

## 2021-11-23 DIAGNOSIS — Z853 Personal history of malignant neoplasm of breast: Secondary | ICD-10-CM

## 2021-11-23 DIAGNOSIS — Z171 Estrogen receptor negative status [ER-]: Secondary | ICD-10-CM | POA: Insufficient documentation

## 2021-11-23 DIAGNOSIS — C73 Malignant neoplasm of thyroid gland: Secondary | ICD-10-CM | POA: Diagnosis not present

## 2021-11-23 NOTE — Patient Instructions (Signed)
[name] at [facility] ?Discharge Instructions ? ? ?You were seen and examined today by Dr. Delton Coombes. ? ?He reviewed the results of your breast MRI.  It shows that the tumor has grown in size since receiving radiation treatment.  ? ?We will refer you back to [facility] to see the radiation doctor and see if they can give more radiation to that area. ? ?Return as scheduled.  ? ? ?Thank you for choosing [place] at [facility] to provide your oncology and hematology care.  To afford each patient quality time with our provider, please arrive at least 15 minutes before your scheduled appointment time.  ? ?If you have a lab appointment with the [facility] please come in thru the Main Entrance and check in at the main information desk. ? ?You need to re-schedule your appointment should you arrive 10 or more minutes late.  We strive to give you quality time with our providers, and arriving late affects you and other patients whose appointments are after yours.  Also, if you no show three or more times for appointments you may be dismissed from the clinic at the providers discretion.     ?Again, thank you for choosing [facility].  Our hope is that these requests will decrease the amount of time that you wait before being seen by our physicians.       ?_____________________________________________________________ ? ?Should you have questions after your visit to [facility], please contact our office at [phone] and follow the prompts.  Our office hours are 8:00 a.m. and 4:30 p.m. Monday - Friday.  Please note that voicemails left after 4:00 p.m. may not be returned until the following business day.  We are closed weekends and major holidays.  You do have access to a nurse 24-7, just call the main number to the clinic [phone] and do not press any options, hold on the line and a nurse will answer the phone.   ? ?For prescription refill  requests, have your pharmacy contact our office and allow 72 hours.   ? ?Due to Covid, you will need to wear a mask upon entering the hospital. If you do not have a mask, a mask will be given to you at the Main Entrance upon arrival. For doctor visits, patients may have 1 support person age 49 or older with them. For treatment visits, patients can not have anyone with them due to social distancing guidelines and our immunocompromised population.  ? ?   ?

## 2021-11-23 NOTE — Progress Notes (Signed)
[facility] [address], [place] [address]   Patient Care Team: Lemmie Evens, MD as PCP - General (Family Medicine) Satira Sark, MD as PCP - Cardiology (Cardiology)  SUMMARY OF ONCOLOGIC HISTORY: Oncology History   No history exists.    CHIEF COMPLIANT: Follow-up of history of right breast cancer and Hodgkin's lymphoma   INTERVAL HISTORY: [name] is a 88 y.o. female here today for follow up of her history of right breast cancer and Hodgkin's lymphoma. Her last visit was on 07/21/2021.   Today [name] reports feeling good. [name] denies fevers, night sweats, and weight loss. [name] reports pain in her right upper arm.   REVIEW OF SYSTEMS:   Review of Systems  Constitutional:  Negative for appetite change, fatigue, fever and unexpected weight change.  Respiratory:  Positive for cough and shortness of breath.   Endocrine: Negative for hot flashes.  Musculoskeletal:  Positive for arthralgias (5/10 R arm).  Psychiatric/Behavioral:  Positive for sleep disturbance.   All other systems reviewed and are negative.  I have reviewed the past medical history, past surgical history, social history and family history with the patient and they are unchanged from previous note.   ALLERGIES:   has No Known Allergies.   MEDICATIONS:  Current Outpatient Medications  Medication Sig Dispense Refill   albuterol (VENTOLIN HFA) 108 (90 Base) MCG/ACT inhaler Inhale into the lungs.     amiodarone (PACERONE) 200 MG tablet Take 0.5 tablets (100 mg total) by mouth daily. 15 tablet 3   aspirin 81 MG tablet Take 81 mg by mouth daily.     atorvastatin (LIPITOR) 80 MG tablet Take 1 tablet by mouth once daily 90 tablet 2   calcium-vitamin D (OSCAL WITH D) 500-200 MG-UNIT tablet Take 1 tablet by mouth 3 (three) times daily. 90 tablet 1   carvedilol (COREG) 6.25 MG tablet Take 1 tablet (6.25 mg total) by mouth 2 (two) times daily with a meal. 180 tablet 3   clopidogrel  (PLAVIX) 75 MG tablet Take 1 tablet by mouth once daily 90 tablet 2   FLOVENT HFA 44 MCG/ACT inhaler Inhale 2 puffs into the lungs 2 (two) times daily.  11   furosemide (LASIX) 20 MG tablet Take 1 tablet (20 mg total) by mouth every morning. 90 tablet 2   hydrALAZINE (APRESOLINE) 10 MG tablet Take 1 tablet (10 mg total) by mouth in the morning and at bedtime. 180 tablet 3   isosorbide mononitrate (IMDUR) 30 MG 24 hr tablet Take 0.5 tablets (15 mg total) by mouth daily. 45 tablet 3   levothyroxine (SYNTHROID, LEVOTHROID) 100 MCG tablet Take 100 mcg by mouth daily.     Multiple Vitamin (MULTI-VITAMIN) tablet Take 1 tablet by mouth daily.     Multiple Vitamins-Calcium (ONE-A-DAY WOMENS PO) Take 1 tablet by mouth daily.     nitroGLYCERIN (NITROSTAT) 0.4 MG SL tablet Place 1 tablet (0.4 mg total) under the tongue every 5 (five) minutes x 3 doses as needed for chest pain. 25 tablet 1   spironolactone (ALDACTONE) 25 MG tablet Take 0.5 tablets (12.5 mg total) by mouth daily. 45 tablet 3   SSD 1 % cream  (Patient not taking: Reported on 11/01/2021)     temazepam (RESTORIL) 30 MG capsule Take 30 mg by mouth at bedtime. (Patient not taking: Reported on 11/01/2021)     vitamin C (ASCORBIC ACID) 500 MG tablet Take 500 mg by mouth daily.     zolpidem (  AMBIEN) 5 MG tablet Take 2.5-5 mg by mouth at bedtime. (Patient not taking: Reported on 11/01/2021)     No current facility-administered medications for this visit.     PHYSICAL EXAMINATION: Performance status (ECOG): 1 - Symptomatic but completely ambulatory  Vitals:   11/23/21 1135  BP: 116/77  Pulse: 75  Resp: 18  Temp: (!) 96.9 F (36.1 C)  SpO2: 100%   Wt Readings from Last 3 Encounters:  11/23/21 187 lb 1.6 oz (84.9 kg)  11/01/21 184 lb (83.5 kg)  07/21/21 180 lb 12.4 oz (82 kg)   Physical Exam Vitals reviewed.  Constitutional:      Appearance: Normal appearance.  Cardiovascular:     Rate and Rhythm: Normal rate and regular rhythm.      Pulses: Normal pulses.     Heart sounds: Normal heart sounds.  Pulmonary:     Effort: Pulmonary effort is normal.     Breath sounds: Normal breath sounds.  Chest:  Breasts:    Right: Mass (medial to nipple 3 cm mass freely mobile) present. No swelling, bleeding, inverted nipple, nipple discharge, skin change or tenderness.     Left: Normal. No swelling, bleeding, inverted nipple, mass, nipple discharge, skin change or tenderness.  Lymphadenopathy:     Upper Body:     Right upper body: No supraclavicular or axillary adenopathy.     Left upper body: No supraclavicular or axillary adenopathy.  Neurological:     General: No focal deficit present.     Mental Status: [name] is alert and oriented to person, place, and time.  Psychiatric:        Mood and Affect: Mood normal.        Behavior: Behavior normal.    Breast Exam Chaperone: Thana Ates     LABORATORY DATA:  I have reviewed the data as listed CMP Latest Ref Rng & Units 11/15/2021 10/12/2021 07/14/2021  Glucose 70 - 99 mg/dL 99 113(H) 135(H)  BUN 8 - 23 mg/dL 51(H) 33(H) 39(H)  Creatinine 0.44 - 1.00 mg/dL 2.71(H) 1.99(H) 1.98(H)  Sodium 135 - 145 mmol/L 138 140 139  Potassium 3.5 - 5.1 mmol/L 3.9 4.5 4.6  Chloride 98 - 111 mmol/L 98 100 100  CO2 22 - 32 mmol/L _0 Calcium 8.9 - 10.3 mg/dL 8.9 8.5(L) 9.0  Total Protein 6.5 - 8.1 g/dL 7.8 - 7.3  Total Bilirubin 0.3 - 1.2 mg/dL 1.0 - 0.8  Alkaline Phos 38 - 126 U/L 57 - 46  AST 15 - 41 U/L 29 - 21  ALT 0 - 44 U/L 29 - 17   Lab Results  Component Value Date   CAN153 17.3 11/15/2021   Lab Results  Component Value Date   WBC 5.8 11/15/2021   HGB 12.5 11/15/2021   HCT 38.9 11/15/2021   MCV 92.6 11/15/2021   PLT 201 11/15/2021   NEUTROABS 3.5 11/15/2021    ASSESSMENT:  1.  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   2.  Stage II right breast cancer: -Diagnosed in 1987, status post right  lumpectomy and axillary lymph node dissection at [facility].  One lymph node was positive. -Adjuvant radiation therapy and tamoxifen.   3.  Thyroid cancer: -Status post near total thyroidectomy.  4.  Stage II right breast TNBC: - Mammogram on 11/09/2020 showed slight increase in calcifications in the right breast measuring 2.1 x 1.5 x 1.8 cm. - Biopsy on 11/18/2020  consistent with IDC in the lower inner quadrant of the right breast.  ER/PR was 0%.  Ki-67 30%.  HER2 is 2+ and equivocal.  HER2 by FISH was negative. - [name] was not felt to be a good surgical candidate given her cardiac comorbidities. - Right breast palliative radiation 3000 cGy in 10 fractions from 03/09/2021 through 03/22/2021.   PLAN:  1.  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  No infections.   2.  Stage II triple negative right breast cancer: - Breast exam today shows 3 cm mass palpable medial to the right nipple.  No palpable adenopathy. - Reviewed mammogram/ultrasound from 11/15/2021 which showed interval progression of the medial right breast pleomorphic calcifications with a mass measuring 3.6 cm. - Labs from 11/15/2021 shows LFTs are normal. - [name] reports right arm pain which is new in the past few months. - Recommend PET CT scan for staging.  RTC after PET scan.  If there is no metastatic disease, will consider seeking opinion from radiation therapy further radiation possible.  [name] could not undergo surgery as [name] is not a surgical candidate.   3.  CKD: - Latest creatinine is 2.7. - Continue follow-up with Dr. Theador Hawthorne.  Breast Cancer therapy associated bone loss: I have recommended calcium, Vitamin D and weight bearing exercises.  Orders placed this encounter:  No orders of the defined types were placed in this encounter.   The patient has a good understanding of the overall plan. [name] agrees with it. [name] will call with any problems that may  develop before the next visit here.  Derek Jack, MD [place] [phone]   I, Thana Ates, am acting as a scribe for Dr. Derek Jack.  I, Derek Jack MD, have reviewed the above documentation for accuracy and completeness, and I agree with the above.

## 2021-11-24 ENCOUNTER — Other Ambulatory Visit (HOSPITAL_COMMUNITY): Payer: Medicare HMO

## 2021-12-01 ENCOUNTER — Other Ambulatory Visit: Payer: Self-pay

## 2021-12-01 ENCOUNTER — Encounter (HOSPITAL_COMMUNITY)
Admission: RE | Admit: 2021-12-01 | Discharge: 2021-12-01 | Disposition: A | Discharge status: Home / Self Care | Payer: Medicare HMO | Source: Ambulatory Visit | Attending: Hematology | Admitting: Hematology

## 2021-12-01 ENCOUNTER — Ambulatory Visit (HOSPITAL_COMMUNITY): Payer: Medicare HMO | Admitting: Hematology

## 2021-12-01 DIAGNOSIS — Z853 Personal history of malignant neoplasm of breast: Secondary | ICD-10-CM | POA: Insufficient documentation

## 2021-12-01 DIAGNOSIS — C8100 Nodular lymphocyte predominant Hodgkin lymphoma, unspecified site: Secondary | ICD-10-CM | POA: Diagnosis present

## 2021-12-01 MED ORDER — FLUDEOXYGLUCOSE F - 18 (FDG) INJECTION
9.6400 | Freq: Once | INTRAVENOUS | Status: AC | PRN
  Administered 2021-12-01: 10:00:00 9.64 via INTRAVENOUS

## 2021-12-07 ENCOUNTER — Inpatient Hospital Stay (HOSPITAL_COMMUNITY): Payer: Medicare HMO | Admitting: Hematology

## 2021-12-07 ENCOUNTER — Other Ambulatory Visit: Payer: Self-pay

## 2021-12-07 VITALS — BP 151/70 | HR 75 | Temp 96.9°F | Resp 17 | Ht 69.0 in | Wt 188.2 lb

## 2021-12-07 DIAGNOSIS — Z853 Personal history of malignant neoplasm of breast: Secondary | ICD-10-CM | POA: Diagnosis not present

## 2021-12-07 DIAGNOSIS — C8100 Nodular lymphocyte predominant Hodgkin lymphoma, unspecified site: Secondary | ICD-10-CM | POA: Diagnosis not present

## 2021-12-07 DIAGNOSIS — C50911 Malignant neoplasm of unspecified site of right female breast: Secondary | ICD-10-CM | POA: Diagnosis not present

## 2021-12-07 NOTE — Patient Instructions (Signed)
[place] at [facility] ?Discharge Instructions ? ? ?You were seen and examined today by Dr. Delton Coombes. ? ?He reviewed the results of your PET scan. It shows that this cancer has not spread to other parts of your body.  ? ?We will refer you to radiation oncology to see if they can do more radiation therapy to your right breast. ? ?Return as scheduled in 3-4 weeks.  ? ? ?Thank you for choosing [place] at [facility] to provide your oncology and hematology care.  To afford each patient quality time with our provider, please arrive at least 15 minutes before your scheduled appointment time.  ? ?If you have a lab appointment with the [facility] please come in thru the Main Entrance and check in at the main information desk. ? ?You need to re-schedule your appointment should you arrive 10 or more minutes late.  We strive to give you quality time with our providers, and arriving late affects you and other patients whose appointments are after yours.  Also, if you no show three or more times for appointments you may be dismissed from the clinic at the providers discretion.     ?Again, thank you for choosing [facility]  Our hope is that these requests will decrease the amount of time that you wait before being seen by our physicians.       ?_____________________________________________________________ ? ?Should you have questions after your visit to [facility], please contact our office at [phone] and follow the prompts.  Our office hours are 8:00 a.m. and 4:30 p.m. Monday - Friday.  Please note that voicemails left after 4:00 p.m. may not be returned until the following business day.  We are closed weekends and major holidays.  You do have access to a nurse 24-7, just call the main number to the clinic [phone] and do not press any options, hold on the line and a nurse will answer the phone.   ? ?For prescription refill requests,  have your pharmacy contact our office and allow 72 hours.   ? ?Due to Covid, you will need to wear a mask upon entering the hospital. If you do not have a mask, a mask will be given to you at the Main Entrance upon arrival. For doctor visits, patients may have 1 support person age 7 or older with them. For treatment visits, patients can not have anyone with them due to social distancing guidelines and our immunocompromised population.  ? ?   ?

## 2021-12-07 NOTE — Progress Notes (Signed)
? ?[facility] ?[address] ?[place], [place] [address] ? ? ?Patient Care Team: ?Lemmie Evens, MD as PCP - General (Family Medicine) ?Satira Sark, MD as PCP - Cardiology (Cardiology) ? ?SUMMARY OF ONCOLOGIC HISTORY: ?Oncology History  ? No history exists.  ? ? ?CHIEF COMPLIANT: Follow-up of history of right breast cancer and Hodgkin's lymphoma ? ? ?INTERVAL HISTORY: [name] is a 88 y.o. female here today for follow up of her right breast cancer and Hodgkin's lymphoma. Her last visit was on 11/23/2021.  ? ?Today she reports feeling good. She reports continued right arm pain. She expresses a preference against surgery at this time but is agreeable to consider radiation.  ? ?REVIEW OF SYSTEMS:   ?Review of Systems  ?Constitutional:  Negative for appetite change and fatigue.  ?Musculoskeletal:  Positive for arthralgias (4/10 R arm).  ?Psychiatric/Behavioral:  Positive for sleep disturbance.   ?All other systems reviewed and are negative. ? ?I have reviewed the past medical history, past surgical history, social history and family history with the patient and they are unchanged from previous note. ? ? ?ALLERGIES:   ?has No Known Allergies. ? ? ?MEDICATIONS:  ?Current Outpatient Medications  ?Medication Sig Dispense Refill  ? albuterol (VENTOLIN HFA) 108 (90 Base) MCG/ACT inhaler Inhale into the lungs.    ? amiodarone (PACERONE) 200 MG tablet Take 0.5 tablets (100 mg total) by mouth daily. 15 tablet 3  ? aspirin 81 MG tablet Take 81 mg by mouth daily.    ? atorvastatin (LIPITOR) 80 MG tablet Take 1 tablet by mouth once daily 90 tablet 2  ? calcium-vitamin D (OSCAL WITH D) 500-200 MG-UNIT tablet Take 1 tablet by mouth 3 (three) times daily. 90 tablet 1  ? carvedilol (COREG) 6.25 MG tablet Take 1 tablet (6.25 mg total) by mouth 2 (two) times daily with a meal. 180 tablet 3  ? clopidogrel (PLAVIX) 75 MG tablet Take 1 tablet by mouth once daily 90 tablet 2  ? FLOVENT HFA 44 MCG/ACT inhaler  Inhale 2 puffs into the lungs 2 (two) times daily.  11  ? furosemide (LASIX) 20 MG tablet Take 1 tablet (20 mg total) by mouth every morning. 90 tablet 2  ? hydrALAZINE (APRESOLINE) 10 MG tablet Take 1 tablet (10 mg total) by mouth in the morning and at bedtime. 180 tablet 3  ? isosorbide mononitrate (IMDUR) 30 MG 24 hr tablet Take 0.5 tablets (15 mg total) by mouth daily. 45 tablet 3  ? levothyroxine (SYNTHROID, LEVOTHROID) 100 MCG tablet Take 100 mcg by mouth daily.    ? Multiple Vitamin (MULTI-VITAMIN) tablet Take 1 tablet by mouth daily.    ? Multiple Vitamins-Calcium (ONE-A-DAY WOMENS PO) Take 1 tablet by mouth daily.    ? nitroGLYCERIN (NITROSTAT) 0.4 MG SL tablet Place 1 tablet (0.4 mg total) under the tongue every 5 (five) minutes x 3 doses as needed for chest pain. (Patient not taking: Reported on 11/23/2021) 25 tablet 1  ? spironolactone (ALDACTONE) 25 MG tablet Take 0.5 tablets (12.5 mg total) by mouth daily. 45 tablet 3  ? SSD 1 % cream     ? temazepam (RESTORIL) 30 MG capsule Take 30 mg by mouth at bedtime.    ? vitamin C (ASCORBIC ACID) 500 MG tablet Take 500 mg by mouth daily.    ? zolpidem (AMBIEN) 5 MG tablet Take 2.5-5 mg by mouth at bedtime.    ? ?No current facility-administered medications for this visit.  ? ? ? ?  PHYSICAL EXAMINATION: ?Performance status (ECOG): 1 - Symptomatic but completely ambulatory ? ?There were no vitals filed for this visit. ?Wt Readings from Last 3 Encounters:  ?11/23/21 187 lb 1.6 oz (84.9 kg)  ?11/01/21 184 lb (83.5 kg)  ?07/21/21 180 lb 12.4 oz (82 kg)  ? ?Physical Exam ?Vitals reviewed.  ?Constitutional:   ?   Appearance: Normal appearance.  ?Cardiovascular:  ?   Rate and Rhythm: Normal rate and regular rhythm.  ?   Pulses: Normal pulses.  ?   Heart sounds: Normal heart sounds.  ?Pulmonary:  ?   Effort: Pulmonary effort is normal.  ?   Breath sounds: Normal breath sounds.  ?Neurological:  ?   General: No focal deficit present.  ?   Mental Status: She is alert and  oriented to person, place, and time.  ?Psychiatric:     ?   Mood and Affect: Mood normal.     ?   Behavior: Behavior normal.  ? ? ?Breast Exam Chaperone: Thana Ates   ? ? ?LABORATORY DATA:  ?I have reviewed the data as listed ?CMP Latest Ref Rng & Units 11/15/2021 10/12/2021 07/14/2021  ?Glucose 70 - 99 mg/dL 99 113(H) 135(H)  ?BUN 8 - 23 mg/dL 51(H) 33(H) 39(H)  ?Creatinine 0.44 - 1.00 mg/dL 2.71(H) 1.99(H) 1.98(H)  ?Sodium 135 - 145 mmol/L 138 140 139  ?Potassium 3.5 - 5.1 mmol/L 3.9 4.5 4.6  ?Chloride 98 - 111 mmol/L 98 100 100  ?CO2 22 - 32 mmol/L _0 ?Calcium 8.9 - 10.3 mg/dL 8.9 8.5(L) 9.0  ?Total Protein 6.5 - 8.1 g/dL 7.8 - 7.3  ?Total Bilirubin 0.3 - 1.2 mg/dL 1.0 - 0.8  ?Alkaline Phos 38 - 126 U/L 57 - 46  ?AST 15 - 41 U/L 29 - 21  ?ALT 0 - 44 U/L 29 - 17  ? ?Lab Results  ?Component Value Date  ? JEH631 17.3 11/15/2021  ? ?Lab Results  ?Component Value Date  ? WBC 5.8 11/15/2021  ? HGB 12.5 11/15/2021  ? HCT 38.9 11/15/2021  ? MCV 92.6 11/15/2021  ? PLT 201 11/15/2021  ? NEUTROABS 3.5 11/15/2021  ? ? ?ASSESSMENT:  ?1.  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 ?  ?2.  Stage II right breast cancer: ?-Diagnosed in 1987, status post right lumpectomy and axillary lymph node dissection at [facility].  One lymph node was positive. ?-Adjuvant radiation therapy and tamoxifen. ?  ?3.  Thyroid cancer: ?-Status post near total thyroidectomy. ? ?4.  Stage II right breast TNBC: ?- Mammogram on 11/09/2020 showed slight increase in calcifications in the right breast measuring 2.1 x 1.5 x 1.8 cm. ?- Biopsy on 11/18/2020 consistent with IDC in the lower inner quadrant of the right breast.  ER/PR was 0%.  Ki-67 30%.  HER2 is 2+ and equivocal.  HER2 by FISH was negative. ?- She was not felt to be a good surgical candidate given her cardiac comorbidities. ?- Right breast palliative radiation 3000 cGy in 10 fractions from 03/09/2021 through  03/22/2021. ? ? ?PLAN:  ?1.  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  No infections. ?  ?2.  Stage II triple negative right breast cancer: ?- Breast exam at last visit showed 3 cm mass palpable medial to the right nipple with no palpable adenopathy. ?- Mammogram/ultrasound from 11/15/2021: Interval progression of medial right breast pleomorphic calcifications with  a mass measuring 3.6 cm. ?- CA 15-3 was normal at 17.3. ?- Reviewed PET scan from 12/01/2021 which showed hypermetabolic right breast mass with SUV 9.95.  No enlarged or hypermetabolic axillary or supraclavicular lymph nodes.  Surgical clips in the right axilla with a small lymph node measuring 8 mm, SUV max 4.16.  No findings of distant metastatic disease. ?- We have discussed surgical resection at this time.  She is not interested. ?- Recommend follow-up with radiation oncology Dr. Lynnette Caffey to see if any further radiation could be given to the site. ? ?  ?3.  CKD: ?- Creatinine is 2.7.  Continue follow-up with Dr. Theador Hawthorne. ? ?Breast Cancer therapy associated bone loss: I have recommended calcium, Vitamin D and weight bearing exercises. ? ?Orders placed this encounter:  ?No orders of the defined types were placed in this encounter. ? ? ?The patient has a good understanding of the overall plan. She agrees with it. She will call with any problems that may develop before the next visit here. ? ?Derek Jack, MD ?[place] ?[phone] ? ? ?I, Thana Ates, am acting as a scribe for Dr. Derek Jack. ? ?I, Derek Jack MD, have reviewed the above documentation for accuracy and completeness, and I agree with the above. ?  ? ? ?

## 2021-12-08 ENCOUNTER — Telehealth (HOSPITAL_COMMUNITY): Payer: Self-pay | Admitting: *Deleted

## 2021-12-08 NOTE — Telephone Encounter (Signed)
[facility] from [facility] called into the clinic asking if pt's current appointment in August needed to be moved up to be seen by the radiation doctor. MD made aware and stated pt's appointment needed to be moved up, due to new findings. Called Olin Hauser back at [facility] to let her know. She verbalized understanding and stated they would get pt in at an earlier appointment time. ?

## 2021-12-15 ENCOUNTER — Other Ambulatory Visit (HOSPITAL_COMMUNITY): Payer: Self-pay

## 2022-01-04 ENCOUNTER — Inpatient Hospital Stay (HOSPITAL_COMMUNITY): Payer: Medicare HMO | Attending: Hematology | Admitting: Hematology

## 2022-01-04 VITALS — BP 104/72 | HR 73 | Temp 98.1°F | Resp 18 | Ht 69.0 in | Wt 188.6 lb

## 2022-01-04 DIAGNOSIS — Z85850 Personal history of malignant neoplasm of thyroid: Secondary | ICD-10-CM | POA: Diagnosis not present

## 2022-01-04 DIAGNOSIS — Z853 Personal history of malignant neoplasm of breast: Secondary | ICD-10-CM | POA: Diagnosis not present

## 2022-01-04 DIAGNOSIS — C8100 Nodular lymphocyte predominant Hodgkin lymphoma, unspecified site: Secondary | ICD-10-CM

## 2022-01-04 DIAGNOSIS — N183 Chronic kidney disease, stage 3 unspecified: Secondary | ICD-10-CM | POA: Insufficient documentation

## 2022-01-04 DIAGNOSIS — Z8571 Personal history of Hodgkin lymphoma: Secondary | ICD-10-CM | POA: Diagnosis not present

## 2022-01-04 DIAGNOSIS — Z801 Family history of malignant neoplasm of trachea, bronchus and lung: Secondary | ICD-10-CM | POA: Diagnosis not present

## 2022-01-04 DIAGNOSIS — I129 Hypertensive chronic kidney disease with stage 1 through stage 4 chronic kidney disease, or unspecified chronic kidney disease: Secondary | ICD-10-CM | POA: Insufficient documentation

## 2022-01-04 NOTE — Progress Notes (Signed)
? ?[facility] ?[address] ?[place], [place] [address] ? ? ?CLINIC:  ?Medical Oncology/Hematology ? ?PCP:  ?Lemmie Evens, MD ?[address] [name] [address] ?[phone] ? ? ?REASON FOR VISIT:  ?Follow-up for history of right breast cancer and Hodgkin's lymphoma ? ?CURRENT THERAPY: surveillance ? ?BRIEF ONCOLOGIC HISTORY:  ?Oncology History  ? No history exists.  ? ? ?CANCER STAGING: ? Cancer Staging  ?No matching staging information was found for the patient. ? ?INTERVAL HISTORY:  ?[name], a 88 y.o. female, returns for routine follow-up of her history of right breast cancer and Hodgkin's lymphoma. [place] was last seen on 12/07/2021.  ? ?Today she reports feeling good. She expresses her preference against surgery at this time. She denies current pains.  ? ?REVIEW OF SYSTEMS:  ?Review of Systems  ?Constitutional:  Negative for appetite change and fatigue.  ?Psychiatric/Behavioral:  Positive for sleep disturbance.   ?All other systems reviewed and are negative. ? ?PAST MEDICAL/SURGICAL HISTORY:  ?Past Medical History:  ?Diagnosis Date  ? Asthma   ? Breast cancer, right breast ([place])   ? CKD (chronic kidney disease), stage III ([place])   ? Coronary atherosclerosis   ? a. cardiac catheterization in 2004 with 60% mid LAD b. nonobstructive disease by cath in 07/2018 at time of NSTEMI  ? DVT (deep venous thrombosis) ([place])   ? Essential hypertension   ? History of breast cancer 1987  ? Right-sided, mastectomy with axillary node dissection and Adriamycin   ? History of thyroid cancer   ? Hodgkin lymphoma ([place]) 04/03/2017  ? Hodgkin's disease ([place])   ? Hypothyroidism   ? Nonischemic cardiomyopathy ([place])   ? a. EF 35-40% by echo in 07/2018  ? NSTEMI (non-ST elevated myocardial infarction) ([place]) 07/27/2018  ? Obesity   ? Osteoporosis   ? Peripheral arterial disease ([place])   ? Stent to the left common iliac in 2006  ? Thyroid carcinoma ([place]) 04/03/2017  ? ?Past Surgical History:  ?Procedure  Laterality Date  ? ABDOMINAL HYSTERECTOMY    ? ANGIOPLASTY / STENTING FEMORAL Left 07/13/2005  ? APPENDECTOMY    ? BUNIONECTOMY Right   ? CATARACT EXTRACTION W/PHACO  05/28/2012  ? Procedure: CATARACT EXTRACTION PHACO AND INTRAOCULAR LENS PLACEMENT (IOC);  Surgeon: Elta Guadeloupe T. Gershon Crane, MD;  Location: [facility];  Service: Ophthalmology;  Laterality: Right;  CDE 12.86  ? CATARACT EXTRACTION W/PHACO  06/04/2012  ? Procedure: CATARACT EXTRACTION PHACO AND INTRAOCULAR LENS PLACEMENT (IOC);  Surgeon: Elta Guadeloupe T. Gershon Crane, MD;  Location: [facility];  Service: Ophthalmology;  Laterality: Left;  CDE:15.32  ? COLONOSCOPY    ? LEFT HEART CATH AND CORONARY ANGIOGRAPHY N/A 07/30/2018  ? Procedure: LEFT HEART CATH AND CORONARY ANGIOGRAPHY;  Surgeon: Belva Crome, MD;  Location: [facility];  Service: Cardiovascular;  Laterality: N/A;  ? MASTECTOMY PARTIAL / LUMPECTOMY W/ AXILLARY LYMPHADENECTOMY Right 1987  ? THYROIDECTOMY    ? TONSILLECTOMY    ? YAG LASER APPLICATION Right 44/09/270  ? Procedure: YAG LASER APPLICATION;  Surgeon: Rutherford Guys, MD;  Location: [facility];  Service: Ophthalmology;  Laterality: Right;  ? YAG LASER APPLICATION Left 01/26/6643  ? Procedure: YAG LASER APPLICATION;  Surgeon: Rutherford Guys, MD;  Location: [facility];  Service: Ophthalmology;  Laterality: Left;  ? ? ?SOCIAL HISTORY:  ?Social History  ? ?Socioeconomic History  ? Marital status: Widowed  ?  Spouse name: Not on file  ? Number of children: Not on file  ? Years  of education: Not on file  ? Highest education level: Not on file  ?Occupational History  ? Not on file  ?Tobacco Use  ? Smoking status: Never  ? Smokeless tobacco: Never  ?Vaping Use  ? Vaping Use: Never used  ?Substance and Sexual Activity  ? Alcohol use: No  ? Drug use: No  ? Sexual activity: Not Currently  ?  Birth control/protection: Surgical  ?Other Topics Concern  ? Not on file  ?Social History Narrative  ? Not on file  ? ?Social Determinants of Health  ? ?Financial Resource Strain: Not on file   ?Food Insecurity: Not on file  ?Transportation Needs: Not on file  ?Physical Activity: Not on file  ?Stress: Not on file  ?Social Connections: Not on file  ?Intimate Partner Violence: Not on file  ? ? ?FAMILY HISTORY:  ?Family History  ?Problem Relation Age of Onset  ? Lung cancer Mother   ? Heart attack Father   ? ? ?CURRENT MEDICATIONS:  ?Current Outpatient Medications  ?Medication Sig Dispense Refill  ? albuterol (VENTOLIN HFA) 108 (90 Base) MCG/ACT inhaler Inhale into the lungs.    ? amiodarone (PACERONE) 200 MG tablet Take 0.5 tablets (100 mg total) by mouth daily. 15 tablet 3  ? aspirin 81 MG tablet Take 81 mg by mouth daily.    ? atorvastatin (LIPITOR) 80 MG tablet Take 1 tablet by mouth once daily 90 tablet 2  ? calcium-vitamin D (OSCAL WITH D) 500-200 MG-UNIT tablet Take 1 tablet by mouth 3 (three) times daily. 90 tablet 1  ? carvedilol (COREG) 6.25 MG tablet Take 1 tablet (6.25 mg total) by mouth 2 (two) times daily with a meal. 180 tablet 3  ? clopidogrel (PLAVIX) 75 MG tablet Take 1 tablet by mouth once daily 90 tablet 2  ? FLOVENT HFA 44 MCG/ACT inhaler Inhale 2 puffs into the lungs 2 (two) times daily.  11  ? furosemide (LASIX) 20 MG tablet Take 1 tablet (20 mg total) by mouth every morning. 90 tablet 2  ? hydrALAZINE (APRESOLINE) 10 MG tablet Take 1 tablet (10 mg total) by mouth in the morning and at bedtime. 180 tablet 3  ? isosorbide mononitrate (IMDUR) 30 MG 24 hr tablet Take 0.5 tablets (15 mg total) by mouth daily. 45 tablet 3  ? levothyroxine (SYNTHROID, LEVOTHROID) 100 MCG tablet Take 100 mcg by mouth daily.    ? Multiple Vitamin (MULTI-VITAMIN) tablet Take 1 tablet by mouth daily.    ? Multiple Vitamins-Calcium (ONE-A-DAY WOMENS PO) Take 1 tablet by mouth daily.    ? nitroGLYCERIN (NITROSTAT) 0.4 MG SL tablet Place 1 tablet (0.4 mg total) under the tongue every 5 (five) minutes x 3 doses as needed for chest pain. 25 tablet 1  ? spironolactone (ALDACTONE) 25 MG tablet Take 0.5 tablets (12.5 mg  total) by mouth daily. 45 tablet 3  ? SSD 1 % cream     ? temazepam (RESTORIL) 30 MG capsule Take 30 mg by mouth at bedtime.    ? vitamin C (ASCORBIC ACID) 500 MG tablet Take 500 mg by mouth daily.    ? zolpidem (AMBIEN) 5 MG tablet Take 2.5-5 mg by mouth at bedtime.    ? ?No current facility-administered medications for this visit.  ? ? ?ALLERGIES:  ?No Known Allergies ? ?PHYSICAL EXAM:  ?Performance status (ECOG): 1 - Symptomatic but completely ambulatory ? ?Vitals:  ? 01/04/22 0802  ?BP: 104/72  ?Pulse: 73  ?Resp: 18  ?Temp: 98.1 ?F (36.7 ?C)  ?  SpO2: 100%  ? ?Wt Readings from Last 3 Encounters:  ?01/04/22 188 lb 9 oz (85.5 kg)  ?12/07/21 188 lb 3.2 oz (85.4 kg)  ?11/23/21 187 lb 1.6 oz (84.9 kg)  ? ?Physical Exam ?Vitals reviewed.  ?Constitutional:   ?   Appearance: Normal appearance.  ?Cardiovascular:  ?   Rate and Rhythm: Normal rate and regular rhythm.  ?   Pulses: Normal pulses.  ?   Heart sounds: Normal heart sounds.  ?Pulmonary:  ?   Effort: Pulmonary effort is normal.  ?   Breath sounds: Normal breath sounds.  ?Neurological:  ?   General: No focal deficit present.  ?   Mental Status: She is alert and oriented to person, place, and time.  ?Psychiatric:     ?   Mood and Affect: Mood normal.     ?   Behavior: Behavior normal.  ?  ? ?LABORATORY DATA:  ?I have reviewed the labs as listed.  ? ?  Latest Ref Rng & Units 11/15/2021  ?  8:47 AM 10/12/2021  ? 10:02 AM 07/14/2021  ? 11:37 AM  ?CBC  ?WBC 4.0 - 10.5 K/uL 5.8   5.1   5.3    ?Hemoglobin 12.0 - 15.0 g/dL 12.5   12.8   11.0    ?Hematocrit 36.0 - 46.0 % 38.9   40.3   35.2    ?Platelets 150 - 400 K/uL 201   186   201    ? ? ?  Latest Ref Rng & Units 11/15/2021  ?  8:47 AM 10/12/2021  ? 10:03 AM 07/14/2021  ? 11:37 AM  ?CMP  ?Glucose 70 - 99 mg/dL 99   113   135    ?BUN 8 - 23 mg/dL 51   33   39    ?Creatinine 0.44 - 1.00 mg/dL 2.71   1.99   1.98    ?Sodium 135 - 145 mmol/L 138   140   139    ?Potassium 3.5 - 5.1 mmol/L 3.9   4.5   4.6    ?Chloride 98 - 111 mmol/L  98   100   100    ?CO2 22 - 32 mmol/L '31   29   30    '$ ?Calcium 8.9 - 10.3 mg/dL 8.9   8.5   9.0    ?Total Protein 6.5 - 8.1 g/dL 7.8    7.3    ?Total Bilirubin 0.3 - 1.2 mg/dL 1.0    0.8    ?Alkaline Ph

## 2022-01-10 ENCOUNTER — Other Ambulatory Visit (HOSPITAL_COMMUNITY)
Admission: RE | Admit: 2022-01-10 | Discharge: 2022-01-10 | Disposition: A | Discharge status: Home / Self Care | Payer: Medicare HMO | Source: Ambulatory Visit | Attending: Nephrology | Admitting: Nephrology

## 2022-01-10 DIAGNOSIS — I129 Hypertensive chronic kidney disease with stage 1 through stage 4 chronic kidney disease, or unspecified chronic kidney disease: Secondary | ICD-10-CM | POA: Diagnosis present

## 2022-01-10 DIAGNOSIS — N184 Chronic kidney disease, stage 4 (severe): Secondary | ICD-10-CM | POA: Diagnosis present

## 2022-01-10 DIAGNOSIS — I5042 Chronic combined systolic (congestive) and diastolic (congestive) heart failure: Secondary | ICD-10-CM | POA: Insufficient documentation

## 2022-01-10 DIAGNOSIS — R809 Proteinuria, unspecified: Secondary | ICD-10-CM | POA: Insufficient documentation

## 2022-01-10 LAB — RENAL FUNCTION PANEL
Albumin: 3.7 g/dL (ref 3.5–5.0)
Anion gap: 9 (ref 5–15)
BUN: 51 mg/dL — ABNORMAL HIGH (ref 8–23)
CO2: 29 mmol/L (ref 22–32)
Calcium: 7.3 mg/dL — ABNORMAL LOW (ref 8.9–10.3)
Chloride: 105 mmol/L (ref 98–111)
Creatinine, Ser: 2.31 mg/dL — ABNORMAL HIGH (ref 0.44–1.00)
GFR, Estimated: 20 mL/min — ABNORMAL LOW (ref >60)
Glucose, Bld: 99 mg/dL (ref 70–99)
Phosphorus: 5.6 mg/dL — ABNORMAL HIGH (ref 2.5–4.6)
Potassium: 4.8 mmol/L (ref 3.5–5.1)
Sodium: 143 mmol/L (ref 135–145)

## 2022-01-10 LAB — IRON AND TIBC
Iron: 71 ug/dL (ref 28–170)
Saturation Ratios: 20 % (ref 10.4–31.8)
TIBC: 356 ug/dL (ref 250–450)
UIBC: 285 ug/dL

## 2022-01-10 LAB — PROTEIN / CREATININE RATIO, URINE
Creatinine, Urine: 173.58 mg/dL
Protein Creatinine Ratio: 0.13 mg/mg{Cre} (ref 0.00–0.15)
Total Protein, Urine: 23 mg/dL

## 2022-01-10 LAB — CBC
HCT: 35.6 % — ABNORMAL LOW (ref 36.0–46.0)
Hemoglobin: 11.3 g/dL — ABNORMAL LOW (ref 12.0–15.0)
MCH: 29.1 pg (ref 26.0–34.0)
MCHC: 31.7 g/dL (ref 30.0–36.0)
MCV: 91.8 fL (ref 80.0–100.0)
Platelets: 174 10*3/uL (ref 150–400)
RBC: 3.88 MIL/uL (ref 3.87–5.11)
RDW: 16.4 % — ABNORMAL HIGH (ref 11.5–15.5)
WBC: 5 10*3/uL (ref 4.0–10.5)
nRBC: 0 % (ref 0.0–0.2)

## 2022-01-10 LAB — VITAMIN D 25 HYDROXY (VIT D DEFICIENCY, FRACTURES): Vit D, 25-Hydroxy: 54.82 ng/mL (ref 30–100)

## 2022-01-10 LAB — FERRITIN: Ferritin: 47 ng/mL (ref 11–307)

## 2022-01-11 LAB — PTH, INTACT AND CALCIUM
Calcium, Total (PTH): 7.3 mg/dL — ABNORMAL LOW (ref 8.7–10.3)
PTH: 17 pg/mL (ref 15–65)

## 2022-01-16 ENCOUNTER — Other Ambulatory Visit: Payer: Self-pay | Admitting: Cardiology

## 2022-01-30 ENCOUNTER — Encounter: Payer: Self-pay | Admitting: Cardiology

## 2022-01-30 ENCOUNTER — Ambulatory Visit: Payer: Medicare HMO | Admitting: Cardiology

## 2022-01-30 VITALS — BP 138/74 | HR 82 | Ht 69.0 in | Wt 186.4 lb

## 2022-01-30 DIAGNOSIS — N184 Chronic kidney disease, stage 4 (severe): Secondary | ICD-10-CM | POA: Diagnosis not present

## 2022-01-30 DIAGNOSIS — I340 Nonrheumatic mitral (valve) insufficiency: Secondary | ICD-10-CM | POA: Diagnosis not present

## 2022-01-30 DIAGNOSIS — I5020 Unspecified systolic (congestive) heart failure: Secondary | ICD-10-CM | POA: Diagnosis not present

## 2022-01-30 DIAGNOSIS — I428 Other cardiomyopathies: Secondary | ICD-10-CM

## 2022-01-30 NOTE — Progress Notes (Signed)
? ? ?Cardiology Office Note ? ?Date: 01/30/2022  ? ?ID: [name], DOB 19-Jun-1930, MRN [id number] ? ?PCP:  Lemmie Evens, MD  ?Cardiologist:  Rozann Lesches, MD ?Electrophysiologist:  None  ? ?Chief Complaint  ?Patient presents with  ? Cardiac follow-up  ? ? ?History of Present Illness: ?[name] is an 88 y.o. female last seen in February.  She is here for a routine visit.  States that she lacks the energy she would like to have, but has been going to the [facility] 2 days a week, also functional with ADLs.  Reports NYHA class II dyspnea at baseline.  No palpitations or syncope. ? ?She continues to follow-up with Dr. Theador Hawthorne, I reviewed her recent lab work with creatinine 2.31 and potassium 4.8.  She was taken off hydralazine in the interim due to low blood pressures. ? ?I reviewed her medications.  We discussed continuing current therapy with follow-up echocardiogram. ? ?Past Medical History:  ?Diagnosis Date  ? Asthma   ? Breast cancer, right breast ([place])   ? CKD (chronic kidney disease), stage III ([place])   ? Coronary atherosclerosis   ? a. cardiac catheterization in 2004 with 60% mid LAD b. nonobstructive disease by cath in 07/2018 at time of NSTEMI  ? DVT (deep venous thrombosis) ([place])   ? Essential hypertension   ? History of breast cancer 1987  ? Right-sided, mastectomy with axillary node dissection and Adriamycin   ? History of thyroid cancer   ? Hodgkin lymphoma ([place]) 04/03/2017  ? Hodgkin's disease ([place])   ? Hypothyroidism   ? Nonischemic cardiomyopathy ([place])   ? a. EF 35-40% by echo in 07/2018  ? NSTEMI (non-ST elevated myocardial infarction) ([place]) 07/27/2018  ? Obesity   ? Osteoporosis   ? Peripheral arterial disease ([place])   ? Stent to the left common iliac in 2006  ? Thyroid carcinoma ([place]) 04/03/2017  ? ? ?Past Surgical History:  ?Procedure Laterality Date  ? ABDOMINAL HYSTERECTOMY    ? ANGIOPLASTY / STENTING FEMORAL Left 07/13/2005  ? APPENDECTOMY    ? BUNIONECTOMY Right   ? CATARACT  EXTRACTION W/PHACO  05/28/2012  ? Procedure: CATARACT EXTRACTION PHACO AND INTRAOCULAR LENS PLACEMENT (IOC);  Surgeon: Elta Guadeloupe T. Gershon Crane, MD;  Location: [facility];  Service: Ophthalmology;  Laterality: Right;  CDE 12.86  ? CATARACT EXTRACTION W/PHACO  06/04/2012  ? Procedure: CATARACT EXTRACTION PHACO AND INTRAOCULAR LENS PLACEMENT (IOC);  Surgeon: Elta Guadeloupe T. Gershon Crane, MD;  Location: [facility];  Service: Ophthalmology;  Laterality: Left;  CDE:15.32  ? COLONOSCOPY    ? LEFT HEART CATH AND CORONARY ANGIOGRAPHY N/A 07/30/2018  ? Procedure: LEFT HEART CATH AND CORONARY ANGIOGRAPHY;  Surgeon: Belva Crome, MD;  Location: [facility];  Service: Cardiovascular;  Laterality: N/A;  ? MASTECTOMY PARTIAL / LUMPECTOMY W/ AXILLARY LYMPHADENECTOMY Right 1987  ? THYROIDECTOMY    ? TONSILLECTOMY    ? YAG LASER APPLICATION Right 61/60/7371  ? Procedure: YAG LASER APPLICATION;  Surgeon: Rutherford Guys, MD;  Location: [facility];  Service: Ophthalmology;  Laterality: Right;  ? YAG LASER APPLICATION Left 0/02/2693  ? Procedure: YAG LASER APPLICATION;  Surgeon: Rutherford Guys, MD;  Location: [facility];  Service: Ophthalmology;  Laterality: Left;  ? ? ?Current Outpatient Medications  ?Medication Sig Dispense Refill  ? albuterol (VENTOLIN HFA) 108 (90 Base) MCG/ACT inhaler Inhale into the lungs.    ? amiodarone (PACERONE) 200 MG tablet Take 1/2 (one-half) tablet by mouth once daily 45 tablet 0  ? aspirin  81 MG tablet Take 81 mg by mouth daily.    ? atorvastatin (LIPITOR) 80 MG tablet Take 1 tablet by mouth once daily 90 tablet 2  ? calcium-vitamin D (OSCAL WITH D) 500-200 MG-UNIT tablet Take 1 tablet by mouth 3 (three) times daily. 90 tablet 1  ? carvedilol (COREG) 6.25 MG tablet Take 1 tablet (6.25 mg total) by mouth 2 (two) times daily with a meal. 180 tablet 3  ? clopidogrel (PLAVIX) 75 MG tablet Take 1 tablet by mouth once daily 90 tablet 2  ? FLOVENT HFA 44 MCG/ACT inhaler Inhale 2 puffs into the lungs 2 (two) times daily.  11  ? furosemide (LASIX)  20 MG tablet Take 1 tablet (20 mg total) by mouth every morning. 90 tablet 2  ? isosorbide mononitrate (IMDUR) 30 MG 24 hr tablet Take 0.5 tablets (15 mg total) by mouth daily. 45 tablet 3  ? levothyroxine (SYNTHROID, LEVOTHROID) 100 MCG tablet Take 100 mcg by mouth daily.    ? Multiple Vitamin (MULTI-VITAMIN) tablet Take 1 tablet by mouth daily.    ? Multiple Vitamins-Calcium (ONE-A-DAY WOMENS PO) Take 1 tablet by mouth daily.    ? nitroGLYCERIN (NITROSTAT) 0.4 MG SL tablet Place 1 tablet (0.4 mg total) under the tongue every 5 (five) minutes x 3 doses as needed for chest pain. 25 tablet 1  ? spironolactone (ALDACTONE) 25 MG tablet Take 0.5 tablets (12.5 mg total) by mouth daily. 45 tablet 3  ? SSD 1 % cream     ? temazepam (RESTORIL) 30 MG capsule Take 30 mg by mouth at bedtime.    ? vitamin C (ASCORBIC ACID) 500 MG tablet Take 500 mg by mouth daily.    ? zolpidem (AMBIEN) 5 MG tablet Take 2.5-5 mg by mouth at bedtime.    ? hydrALAZINE (APRESOLINE) 10 MG tablet Take 1 tablet (10 mg total) by mouth in the morning and at bedtime. (Patient not taking: Reported on 01/30/2022) 180 tablet 3  ? ?No current facility-administered medications for this visit.  ? ?Allergies:  Patient has no known allergies.  ? ?ROS:  No syncope. ? ?Physical Exam: ?VS:  BP 138/74   Pulse 82   Ht '5\' 9"'$  (1.753 m)   Wt 186 lb 6.4 oz (84.6 kg)   SpO2 94%   BMI 27.53 kg/m? , BMI Body mass index is 27.53 kg/m?. ? ?Wt Readings from Last 3 Encounters:  ?01/30/22 186 lb 6.4 oz (84.6 kg)  ?01/04/22 188 lb 9 oz (85.5 kg)  ?12/07/21 188 lb 3.2 oz (85.4 kg)  ?  ?General: Patient appears comfortable at rest. ?HEENT: Conjunctiva and lids normal. ?Neck: Supple, no elevated JVP or carotid bruits, no thyromegaly. ?Lungs: Clear to auscultation, nonlabored breathing at rest. ?Cardiac: Regular rate and rhythm, no S3 or significant systolic murmur. ?Extremities: No pitting edema. ? ?ECG:  An ECG dated 11/01/2021 was personally reviewed today and demonstrated:   Sinus rhythm with PVC, nonspecific T wave changes. ? ?Recent Labwork: ?06/29/2021: TSH 2.446 ?11/15/2021: ALT 29; AST 29 ?01/10/2022: BUN 51; Creatinine, Ser 2.31; Hemoglobin 11.3; Platelets 174; Potassium 4.8; Sodium 143  ?   ?Component Value Date/Time  ? CHOL 150 06/29/2021 1044  ? TRIG 74 06/29/2021 1044  ? HDL 70 06/29/2021 1044  ? CHOLHDL 2.1 06/29/2021 1044  ? VLDL 15 06/29/2021 1044  ? [place] 65 06/29/2021 1044  ? ? ?Other Studies Reviewed Today: ? ?Echocardiogram 12/08/2020: ? 1. Compared to report from November 2020, no significant change in LVEF  ?or  MR.  ? 2. LVEF is depressed with diffuse hypokinesis, worse in the inferior,  ?inferoseptal walls; inferolateral akinesis. . Left ventricular ejection  ?fraction, by estimation, is 35%%. The left ventricle has moderately  ?decreased function. The left ventricular  ?internal cavity size was mildly dilated. Indeterminate diastolic filling  ?due to E-A fusion.  ? 3. Right ventricular systolic function is normal. The right ventricular  ?size is normal. There is normal pulmonary artery systolic pressure.  ? 4. Left atrial size was mildly dilated.  ? 5. There appear to be at least 2 jets of MR . The mitral valve is  ?degenerative. Moderate mitral valve regurgitation.  ? 6. The aortic valve is tricuspid. Aortic valve regurgitation is not  ?visualized. Mild to moderate aortic valve sclerosis/calcification is  ?present, without any evidence of aortic stenosis.  ? 7. The inferior vena cava is dilated in size with <50% respiratory  ?variability, suggesting right atrial pressure of 15 mmHg.  ? ?Assessment and Plan: ? ?1.  HFrEF with mixed cardiomyopathy, LVEF approximately 35% as of March 2022, RV contraction normal at that time.  Plan to repeat echocardiogram.  GDMT limited by blood pressure and also CKD stage IV.  She is relatively stable at this time.  Continue Coreg, Imdur, Lasix, and Aldactone. ? ?2.  CKD stage IV, recent creatinine 2.31 and potassium 4.8.  She  continues to follow with Dr. Theador Hawthorne. ? ?3.  PVCs and spells of near syncope.  She is on low-dose amiodarone for suppression of VT. ? ?4.  Moderate mitral regurgitation. ? ?Medication Adjustments/Labs and Tests Ordered: ?Cur

## 2022-01-30 NOTE — Patient Instructions (Signed)
Medication Instructions:  ?Your physician recommends that you continue on your current medications as directed. Please refer to the Current Medication list given to you today. ? ? ?Labwork: ?None today ? ?Testing/Procedures: ? ?Your physician has requested that you have an echocardiogram. Echocardiography is a painless test that uses sound waves to create images of your heart. It provides your doctor with information about the size and shape of your heart and how well your heart?s chambers and valves are working. This procedure takes approximately one hour. There are no restrictions for this procedure. ? ? ?Follow-Up: ? ?3 months ? ?Any Other Special Instructions Will Be Listed Below (If Applicable). ? ?If you need a refill on your cardiac medications before your next appointment, please call your pharmacy. ? ?

## 2022-02-08 ENCOUNTER — Ambulatory Visit (HOSPITAL_COMMUNITY)
Admission: RE | Admit: 2022-02-08 | Discharge: 2022-02-08 | Disposition: A | Discharge status: Home / Self Care | Payer: Medicare HMO | Source: Ambulatory Visit | Attending: Cardiology | Admitting: Cardiology

## 2022-02-08 DIAGNOSIS — I428 Other cardiomyopathies: Secondary | ICD-10-CM | POA: Diagnosis not present

## 2022-02-08 LAB — ECHOCARDIOGRAM COMPLETE
AR max vel: 2.16 cm2
AV Area VTI: 1.76 cm2
AV Area mean vel: 1.8 cm2
AV Mean grad: 3.4 mmHg
AV Peak grad: 6 mmHg
Ao pk vel: 1.22 m/s
Area-P 1/2: 2.73 cm2
Calc EF: 37.1 %
S' Lateral: 4.9 cm
Single Plane A2C EF: 32.1 %
Single Plane A4C EF: 41.7 %

## 2022-02-08 NOTE — Progress Notes (Signed)
*  PRELIMINARY RESULTS* ?Echocardiogram ?2D Echocardiogram has been performed. ? ?[name] ?02/08/2022, 12:34 PM ?

## 2022-02-10 ENCOUNTER — Telehealth: Payer: Self-pay

## 2022-02-10 NOTE — Telephone Encounter (Signed)
-----   Message from Satira Sark, MD sent at 02/08/2022 12:58 PM EDT ----- Results reviewed.  LVEF relatively stable at 30 to 35%.  Mild to moderate mitral regurgitation but normal estimated pulmonary pressures.  Continue with current medications and follow-up plan.

## 2022-02-10 NOTE — Telephone Encounter (Signed)
Patient notified and verbalized understanding. Pt had no questions or concerns at this time 

## 2022-02-22 ENCOUNTER — Other Ambulatory Visit (HOSPITAL_COMMUNITY)
Admission: RE | Admit: 2022-02-22 | Discharge: 2022-02-22 | Disposition: A | Discharge status: Home / Self Care | Payer: Medicare HMO | Source: Ambulatory Visit | Attending: Nephrology | Admitting: Nephrology

## 2022-02-22 DIAGNOSIS — N184 Chronic kidney disease, stage 4 (severe): Secondary | ICD-10-CM | POA: Diagnosis present

## 2022-02-22 LAB — RENAL FUNCTION PANEL
Albumin: 3.6 g/dL (ref 3.5–5.0)
Anion gap: 6 (ref 5–15)
BUN: 42 mg/dL — ABNORMAL HIGH (ref 8–23)
CO2: 30 mmol/L (ref 22–32)
Calcium: 6.6 mg/dL — ABNORMAL LOW (ref 8.9–10.3)
Chloride: 104 mmol/L (ref 98–111)
Creatinine, Ser: 2.41 mg/dL — ABNORMAL HIGH (ref 0.44–1.00)
GFR, Estimated: 19 mL/min — ABNORMAL LOW (ref >60)
Glucose, Bld: 100 mg/dL — ABNORMAL HIGH (ref 70–99)
Phosphorus: 5.8 mg/dL — ABNORMAL HIGH (ref 2.5–4.6)
Potassium: 4.8 mmol/L (ref 3.5–5.1)
Sodium: 140 mmol/L (ref 135–145)

## 2022-03-01 ENCOUNTER — Ambulatory Visit (HOSPITAL_COMMUNITY): Payer: Medicare HMO | Admitting: Hematology

## 2022-04-04 ENCOUNTER — Other Ambulatory Visit: Payer: Self-pay | Admitting: Cardiology

## 2022-04-05 ENCOUNTER — Telehealth: Payer: Self-pay | Admitting: Cardiology

## 2022-04-05 MED ORDER — HYDRALAZINE HCL 10 MG PO TABS: 10.0000 mg | ORAL_TABLET | Freq: Two times a day (BID) | ORAL | 1 refills | Status: AC

## 2022-04-05 MED ORDER — AMIODARONE HCL 200 MG PO TABS: ORAL_TABLET | ORAL | 1 refills | Status: AC

## 2022-04-05 MED ORDER — ISOSORBIDE MONONITRATE ER 30 MG PO TB24: ORAL_TABLET | ORAL | 1 refills | Status: AC

## 2022-04-05 MED ORDER — ATORVASTATIN CALCIUM 80 MG PO TABS: 80.0000 mg | ORAL_TABLET | Freq: Every day | ORAL | 1 refills | Status: DC

## 2022-04-05 NOTE — Telephone Encounter (Signed)
*  STAT* If patient is at the pharmacy, call can be transferred to refill team.   1. Which medications need to be refilled? (please list name of each medication and dose if known)  amiodarone (PACERONE) 200 MG tablet atorvastatin (LIPITOR) 80 MG tablet hydrALAZINE (APRESOLINE) 10 MG tablet isosorbide mononitrate (IMDUR) 30 MG 24 hr tablet   2. Which pharmacy/location (including street and city if local pharmacy) is medication to be sent to? [facility], mail order pharmacy  3. Do they need a 30 day or 90 day supply? 90 day   Patient has switched to a mail order pharmacy, [facility]

## 2022-04-05 NOTE — Telephone Encounter (Signed)
Complete

## 2022-05-02 NOTE — Progress Notes (Unsigned)
Cardiology Office Note    Date:  05/03/2022   ID:  NONIE LOCHNER, DOB 04/21/1930, MRN [id number]  PCP:  Lemmie Evens, MD  Cardiologist: Rozann Lesches, MD    Chief Complaint  Patient presents with   Follow-up    3 month visit    History of Present Illness:    [place] is a 88 y.o. 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 MDomenic Politein 01/2022 and reported still having fatigue and NYHA class II dyspnea but denied any other acute changes. She had recently been evaluated by Nephrology and Hydralazine was discontinued given hypotension. A follow-up echocardiogram was recommended and she was continued on Coreg, Imdur, Lasix and Aldactone. Imaging showed her EF was overall stable at 30 to 35% and she did have mild to moderate mitral valve regurgitation.  In talking with the patient today, she reports overall remaining active for her age. She enjoys working in her yard and is still going to [employer] to participate in [employer] She denies any recent orthopnea, PND or pitting edema. No recent chest pain or palpitations. She lives by herself and says she does not typically consume 3 full meals daily and family members have encouraged her to supplement with Ensure. She has been drinking at least 2 of these daily and says her weight has been stable.    Past Medical History:  Diagnosis Date   Asthma    Breast cancer, right breast ([facility]    CKD (chronic kidney disease), stage III ([facility]    Coronary atherosclerosis    a. cardiac  catheterization in 2004 with 60% mid LAD b. nonobstructive disease by cath in 07/2018 at time of NSTEMI   DVT (deep venous thrombosis) (HCC)    Essential hypertension    History of breast cancer 1987   Right-sided, mastectomy with axillary node dissection and Adriamycin    History of thyroid cancer    Hodgkin lymphoma ([facility] 04/03/2017   Hodgkin's disease ([facility]    Hypothyroidism    Nonischemic cardiomyopathy ([facility]    a. EF 35-40% by echo in 07/2018   NSTEMI (non-ST elevated myocardial infarction) ([facility] 07/27/2018   Obesity    Osteoporosis    Peripheral arterial disease ([facility]    Stent to the left common iliac in 2006   Thyroid carcinoma ([facility] 04/03/2017    Past Surgical History:  Procedure Laterality Date   ABDOMINAL HYSTERECTOMY     ANGIOPLASTY / STENTING FEMORAL Left 07/13/2005   APPENDECTOMY     BUNIONECTOMY Right    CATARACT EXTRACTION W/PHACO  05/28/2012   Procedure: CATARACT EXTRACTION PHACO AND INTRAOCULAR LENS PLACEMENT ([facility];  Surgeon: MElta GuadeloupeT. SGershon Crane MD;  Location: [facility];  Service: Ophthalmology;  Laterality: Right;  CDE 12.86   CATARACT EXTRACTION W/PHACO  06/04/2012   Procedure: CATARACT EXTRACTION PHACO AND INTRAOCULAR LENS PLACEMENT ([facility]);  Surgeon: MElta GuadeloupeT. SGershon Crane MD;  Location: [facility];  Service: Ophthalmology;  Laterality: Left;  CDE:15.32   COLONOSCOPY     LEFT HEART CATH AND CORONARY ANGIOGRAPHY N/A 07/30/2018   Procedure: LEFT  HEART CATH AND CORONARY ANGIOGRAPHY;  Surgeon: Belva Crome, MD;  Location: [facility];  Service: Cardiovascular;  Laterality: N/A;   MASTECTOMY PARTIAL / LUMPECTOMY W/ AXILLARY LYMPHADENECTOMY Right 1987   THYROIDECTOMY     TONSILLECTOMY     YAG LASER APPLICATION Right 67/89/3810   Procedure: YAG LASER APPLICATION;  Surgeon: Rutherford Guys, MD;  Location: [facility];  Service: Ophthalmology;  Laterality: Right;   YAG LASER APPLICATION Left 10/01/5100   Procedure: YAG LASER APPLICATION;  Surgeon: Rutherford Guys, MD;  Location: [facility];   Service: Ophthalmology;  Laterality: Left;    Current Medications: Outpatient Medications Prior to Visit  Medication Sig Dispense Refill   albuterol (VENTOLIN HFA) 108 (90 Base) MCG/ACT inhaler Inhale into the lungs.     amiodarone (PACERONE) 200 MG tablet Take 1/2 (one-half) tablet by mouth once daily 45 tablet 1   aspirin 81 MG tablet Take 81 mg by mouth daily.     atorvastatin (LIPITOR) 80 MG tablet Take 1 tablet (80 mg total) by mouth daily. 90 tablet 1   calcium-vitamin D (OSCAL WITH D) 500-200 MG-UNIT tablet Take 1 tablet by mouth 3 (three) times daily. 90 tablet 1   carvedilol (COREG) 6.25 MG tablet Take 1 tablet (6.25 mg total) by mouth 2 (two) times daily with a meal. 180 tablet 3   clopidogrel (PLAVIX) 75 MG tablet Take 1 tablet by mouth once daily 90 tablet 2   FLOVENT HFA 44 MCG/ACT inhaler Inhale 2 puffs into the lungs 2 (two) times daily.  11   furosemide (LASIX) 20 MG tablet Take 1 tablet (20 mg total) by mouth every morning. 90 tablet 2   hydrALAZINE (APRESOLINE) 10 MG tablet Take 1 tablet (10 mg total) by mouth in the morning and at bedtime. 180 tablet 1   isosorbide mononitrate (IMDUR) 30 MG 24 hr tablet Take 1/2 (one-half) tablet by mouth once daily 45 tablet 1   levothyroxine (SYNTHROID, LEVOTHROID) 100 MCG tablet Take 100 mcg by mouth daily.     Multiple Vitamin (MULTI-VITAMIN) tablet Take 1 tablet by mouth daily.     Multiple Vitamins-Calcium (ONE-A-DAY WOMENS PO) Take 1 tablet by mouth daily.     nitroGLYCERIN (NITROSTAT) 0.4 MG SL tablet Place 1 tablet (0.4 mg total) under the tongue every 5 (five) minutes x 3 doses as needed for chest pain. 25 tablet 1   SSD 1 % cream      temazepam (RESTORIL) 30 MG capsule Take 30 mg by mouth at bedtime.     vitamin C (ASCORBIC ACID) 500 MG tablet Take 500 mg by mouth daily.     zolpidem (AMBIEN) 5 MG tablet Take 2.5-5 mg by mouth at bedtime.     spironolactone (ALDACTONE) 25 MG tablet Take 0.5 tablets (12.5 mg total) by mouth  daily. 45 tablet 3   No facility-administered medications prior to visit.     Allergies:   Patient has no known allergies.   Social History   Socioeconomic History   Marital status: Widowed    Spouse name: Not on file   Number of children: Not on file   Years of education: Not on file   Highest education level: Not on file  Occupational History   Not on file  Tobacco Use   Smoking status: Never   Smokeless tobacco: Never  Vaping Use   Vaping Use: Never used  Substance and Sexual Activity   Alcohol use: No   Drug use: No   Sexual activity:  Not Currently    Birth control/protection: Surgical  Other Topics Concern   Not on file  Social History Narrative   Not on file   Social Determinants of Health   Financial Resource Strain: Low Risk  (11/11/2020)   Overall Financial Resource Strain (CARDIA)    Difficulty of Paying Living Expenses: Not very hard  Food Insecurity: No Food Insecurity (11/11/2020)   Hunger Vital Sign    Worried About Running Out of Food in the Last Year: Never true    Ran Out of Food in the Last Year: Never true  Transportation Needs: No Transportation Needs (11/11/2020)   PRAPARE - Hydrologist (Medical): No    Lack of Transportation (Non-Medical): No  Physical Activity: Inactive (11/11/2020)   Exercise Vital Sign    Days of Exercise per Week: 0 days    Minutes of Exercise per Session: 0 min  Stress: No Stress Concern Present (11/11/2020)   [place]    Feeling of Stress : Not at all  Social Connections: Moderately Isolated (11/11/2020)   Social Connection and Isolation Panel [NHANES]    Frequency of Communication with Friends and Family: Three times a week    Frequency of Social Gatherings with Friends and Family: Twice a week    Attends Religious Services: 1 to 4 times per year    Active Member of [employer] or Organizations: No    Attends Archivist  Meetings: Never    Marital Status: Widowed     Family History:  The patient's family history includes Heart attack in her father; Lung cancer in her mother.   Review of Systems:    Please see the history of present illness.     All other systems reviewed and are otherwise negative except as noted above.   Physical Exam:    VS:  BP 120/68   Pulse 76   Ht '5\' 9"'$  (1.753 m)   Wt 188 lb (85.3 kg)   SpO2 94%   BMI 27.76 kg/m    General: Pleasant, elderly female appearing in no acute distress. Appears younger than stated age.  Head: Normocephalic, atraumatic. Neck: No carotid bruits. JVD not elevated.  Lungs: Respirations regular and unlabored, without wheezes or rales.  Heart: Regular rate and rhythm. No S3 or S4.  No murmur, no rubs, or gallops appreciated. Abdomen: Appears non-distended. No obvious abdominal masses. Msk:  Strength and tone appear normal for age. No obvious joint deformities or effusions. Extremities: No clubbing or cyanosis. No pitting edema.  Distal pedal pulses are 2+ bilaterally. Neuro: Alert and oriented X 3. Moves all extremities spontaneously. No focal deficits noted. Psych:  Responds to questions appropriately with a normal affect. Skin: No rashes or lesions noted  Wt Readings from Last 3 Encounters:  05/03/22 188 lb (85.3 kg)  01/30/22 186 lb 6.4 oz (84.6 kg)  01/04/22 188 lb 9 oz (85.5 kg)     Studies/Labs Reviewed:   EKG:  EKG is not ordered today.   Recent Labs: 06/29/2021: TSH 2.446 11/15/2021: ALT 29 01/10/2022: Hemoglobin 11.3; Platelets 174 02/22/2022: BUN 42; Creatinine, Ser 2.41; Potassium 4.8; Sodium 140   Lipid Panel    Component Value Date/Time   CHOL 150 06/29/2021 1044   TRIG 74 06/29/2021 1044   HDL 70 06/29/2021 1044   CHOLHDL 2.1 06/29/2021 1044   VLDL 15 06/29/2021 1044   LDLCALC 65 06/29/2021 1044    Additional studies/ records  that were reviewed today include:   Echocardiogram: 02/08/2022 IMPRESSIONS     1. Left  ventricular ejection fraction, by estimation, is 30 to 35%. The  left ventricle has normal function. The left ventricle demonstrates global  hypokinesis. There is mild left ventricular hypertrophy. Left ventricular  diastolic parameters are  consistent with Grade I diastolic dysfunction (impaired relaxation).   2. Right ventricular systolic function is normal. The right ventricular  size is normal. There is normal pulmonary artery systolic pressure.   3. The mitral valve is abnormal. Mild to moderate mitral valve  regurgitation. No evidence of mitral stenosis.   4. The aortic valve has an indeterminant number of cusps. There is mild  calcification of the aortic valve. There is mild thickening of the aortic  valve. Aortic valve regurgitation is not visualized. No aortic stenosis is  present.   5. The inferior vena cava is normal in size with greater than 50%  respiratory variability, suggesting right atrial pressure of 3 mmHg.   Assessment:    1. HFrEF (heart failure with reduced ejection fraction) ([place])   2. Coronary artery disease involving native coronary artery of native heart without angina pectoris   3. PVC's (premature ventricular contractions)   4. Essential hypertension   5. Hyperlipidemia LDL goal <70   6. CKD (chronic kidney disease) stage 4, GFR 15-29 ml/min (HCC)      Plan:   In order of problems listed above:  1. HFrEF/NICM - She has a known cardiomyopathy with EF at 30-35% by most recent echo in 01/2022 and her MR was in a mild to moderate range. Her weight has been stable and she appears euvolemic on examination today.   - Remains on Coreg 6.'25mg'$  BID, Hydralazine '10mg'$  BID, Imdur '15mg'$  daily and Spironolactone 12.'5mg'$  daily. Renal function and BP did not previously allow for Entresto and she has not been on an SGLT2 inhibitor given her GFR is ~ 16 to 20.  2. CAD - Prior cath in 2019 showed nonobstructive disease. She remains active at baseline for her age and denies  any recent anginal symptoms. - Continue current medical therapy with ASA '81mg'$  daily, Plavix '75mg'$  daily, Imdur '15mg'$  daily and Coreg 6.'25mg'$  BID. If she develops any bleeding issues in the future, could likely discontinue Plavix but she reports she has been on this from a vascular perspective as well but has not been evaluated by them in several years.   3. PVC's - She has remained on low-dose Amiodarone '100mg'$  daily. LFT's were WNL in 10/2021. Will request recent labs from her PCP to make sure a TSH has been checked recently.   4. HTN - BP is well-controlled at 120/68 during today's visit. Continue current medication regimen with Coreg, Hydralazine, Imdur and Spironolactone.   5. HLD - Followed by her PCP and will request most recent labs. She remains on Atorvastatin '80mg'$  daily.   6. Stage 3-4 CKD - Followed by Dr. Theador Hawthorne. Creatinine was stable at 2.41 when checked in 01/2022.    Medication Adjustments/Labs and Tests Ordered: Current medicines are reviewed at length with the patient today.  Concerns regarding medicines are outlined above.  Medication changes, Labs and Tests ordered today are listed in the Patient Instructions below. Patient Instructions  Medication Instructions:  Your physician recommends that you continue on your current medications as directed. Please refer to the Current Medication list given to you today.  *If you need a refill on your cardiac medications before your next appointment, please call your  pharmacy*   Lab Work: NONE   If you have labs (blood work) drawn today and your tests are completely normal, you will receive your results only by: [place] (if you have MyChart) OR A paper copy in the mail If you have any lab test that is abnormal or we need to change your treatment, we will call you to review the results.   Testing/Procedures: NONE    Follow-Up: At [facility], you and your health needs are our priority.  As part of our continuing  mission to provide you with exceptional heart care, we have created designated Provider Care Teams.  These Care Teams include your primary Cardiologist (physician) and Advanced Practice Providers (APPs -  Physician Assistants and Nurse Practitioners) who all work together to provide you with the care you need, when you need it.  We recommend signing up for the patient portal called "MyChart".  Sign up information is provided on this After Visit Summary.  MyChart is used to connect with patients for Virtual Visits (Telemedicine).  Patients are able to view lab/test results, encounter notes, upcoming appointments, etc.  Non-urgent messages can be sent to your provider as well.   To learn more about what you can do with MyChart, go to [email or web address].    Your next appointment:   6 month(s)  The format for your next appointment:   In Person  Provider:   Rozann Lesches, MD    Other Instructions Thank you for choosing [facility]!    Important Information About Sugar         Signed, Erma Heritage, PA-C  05/03/2022 2:21 PM    [facility] [address] [place], [place] [address] Phone: [phone] Fax: [phone]

## 2022-05-03 ENCOUNTER — Encounter: Payer: Self-pay | Admitting: Student

## 2022-05-03 ENCOUNTER — Other Ambulatory Visit (HOSPITAL_COMMUNITY)
Admission: RE | Admit: 2022-05-03 | Discharge: 2022-05-03 | Disposition: A | Discharge status: Home / Self Care | Payer: Medicare HMO | Source: Ambulatory Visit | Attending: Nephrology | Admitting: Nephrology

## 2022-05-03 ENCOUNTER — Ambulatory Visit: Payer: Medicare HMO | Admitting: Student

## 2022-05-03 ENCOUNTER — Encounter: Payer: Self-pay | Admitting: *Deleted

## 2022-05-03 VITALS — BP 120/68 | HR 76 | Ht 69.0 in | Wt 188.0 lb

## 2022-05-03 DIAGNOSIS — I10 Essential (primary) hypertension: Secondary | ICD-10-CM

## 2022-05-03 DIAGNOSIS — N184 Chronic kidney disease, stage 4 (severe): Secondary | ICD-10-CM

## 2022-05-03 DIAGNOSIS — I2510 Atherosclerotic heart disease of native coronary artery without angina pectoris: Secondary | ICD-10-CM | POA: Diagnosis not present

## 2022-05-03 DIAGNOSIS — I493 Ventricular premature depolarization: Secondary | ICD-10-CM | POA: Diagnosis not present

## 2022-05-03 DIAGNOSIS — E785 Hyperlipidemia, unspecified: Secondary | ICD-10-CM

## 2022-05-03 DIAGNOSIS — I5020 Unspecified systolic (congestive) heart failure: Secondary | ICD-10-CM | POA: Diagnosis not present

## 2022-05-03 LAB — RENAL FUNCTION PANEL
Albumin: 3.7 g/dL (ref 3.5–5.0)
Anion gap: 10 (ref 5–15)
BUN: 44 mg/dL — ABNORMAL HIGH (ref 8–23)
CO2: 29 mmol/L (ref 22–32)
Calcium: 8.2 mg/dL — ABNORMAL LOW (ref 8.9–10.3)
Chloride: 103 mmol/L (ref 98–111)
Creatinine, Ser: 2.31 mg/dL — ABNORMAL HIGH (ref 0.44–1.00)
GFR, Estimated: 20 mL/min — ABNORMAL LOW (ref >60)
Glucose, Bld: 98 mg/dL (ref 70–99)
Phosphorus: 4.6 mg/dL (ref 2.5–4.6)
Potassium: 4 mmol/L (ref 3.5–5.1)
Sodium: 142 mmol/L (ref 135–145)

## 2022-05-03 LAB — CBC
HCT: 36.9 % (ref 36.0–46.0)
Hemoglobin: 11.7 g/dL — ABNORMAL LOW (ref 12.0–15.0)
MCH: 29.4 pg (ref 26.0–34.0)
MCHC: 31.7 g/dL (ref 30.0–36.0)
MCV: 92.7 fL (ref 80.0–100.0)
Platelets: 198 10*3/uL (ref 150–400)
RBC: 3.98 MIL/uL (ref 3.87–5.11)
RDW: 15.7 % — ABNORMAL HIGH (ref 11.5–15.5)
WBC: 5.7 10*3/uL (ref 4.0–10.5)
nRBC: 0 % (ref 0.0–0.2)

## 2022-05-03 LAB — PROTEIN / CREATININE RATIO, URINE
Creatinine, Urine: 74.3 mg/dL
Protein Creatinine Ratio: 0.08 mg/mg{Cre} (ref 0.00–0.15)
Total Protein, Urine: 6 mg/dL

## 2022-05-03 NOTE — Patient Instructions (Signed)
Medication Instructions:  Your physician recommends that you continue on your current medications as directed. Please refer to the Current Medication list given to you today.  *If you need a refill on your cardiac medications before your next appointment, please call your pharmacy*   Lab Work: NONE   If you have labs (blood work) drawn today and your tests are completely normal, you will receive your results only by: MyChart Message (if you have MyChart) OR A paper copy in the mail If you have any lab test that is abnormal or we need to change your treatment, we will call you to review the results.   Testing/Procedures: NONE    Follow-Up: At [facility], you and your health needs are our priority.  As part of our continuing mission to provide you with exceptional heart care, we have created designated Provider Care Teams.  These Care Teams include your primary Cardiologist (physician) and Advanced Practice Providers (APPs -  Physician Assistants and Nurse Practitioners) who all work together to provide you with the care you need, when you need it.  We recommend signing up for the patient portal called "MyChart".  Sign up information is provided on this After Visit Summary.  MyChart is used to connect with patients for Virtual Visits (Telemedicine).  Patients are able to view lab/test results, encounter notes, upcoming appointments, etc.  Non-urgent messages can be sent to your provider as well.   To learn more about what you can do with MyChart, go to [email or web address].    Your next appointment:   6 month(s)  The format for your next appointment:   In Person  Provider:   Samuel McDowell, MD    Other Instructions Thank you for choosing [facility]!    Important Information About Sugar       

## 2022-05-04 ENCOUNTER — Encounter (HOSPITAL_COMMUNITY): Payer: Medicare HMO

## 2022-05-11 ENCOUNTER — Encounter (HOSPITAL_COMMUNITY)
Admission: RE | Admit: 2022-05-11 | Discharge: 2022-05-11 | Disposition: A | Discharge status: Home / Self Care | Payer: Medicare HMO | Source: Ambulatory Visit | Attending: Hematology | Admitting: Hematology

## 2022-05-11 DIAGNOSIS — C8100 Nodular lymphocyte predominant Hodgkin lymphoma, unspecified site: Secondary | ICD-10-CM | POA: Diagnosis present

## 2022-05-11 DIAGNOSIS — Z853 Personal history of malignant neoplasm of breast: Secondary | ICD-10-CM | POA: Diagnosis present

## 2022-05-11 MED ORDER — FLUDEOXYGLUCOSE F - 18 (FDG) INJECTION
9.5300 | Freq: Once | INTRAVENOUS | Status: AC | PRN
  Administered 2022-05-11: 9.53 via INTRAVENOUS

## 2022-05-23 ENCOUNTER — Ambulatory Visit (HOSPITAL_COMMUNITY)
Admission: RE | Admit: 2022-05-23 | Discharge: 2022-05-23 | Disposition: A | Discharge status: Home / Self Care | Payer: Medicare HMO | Source: Ambulatory Visit | Attending: Hematology | Admitting: Hematology

## 2022-05-23 DIAGNOSIS — Z853 Personal history of malignant neoplasm of breast: Secondary | ICD-10-CM | POA: Diagnosis present

## 2022-05-23 DIAGNOSIS — C8100 Nodular lymphocyte predominant Hodgkin lymphoma, unspecified site: Secondary | ICD-10-CM

## 2022-05-25 ENCOUNTER — Inpatient Hospital Stay: Payer: Medicare HMO | Attending: Hematology | Admitting: Hematology

## 2022-05-25 VITALS — BP 114/66 | HR 64 | Temp 98.3°F | Resp 18 | Ht 69.0 in | Wt 187.8 lb

## 2022-05-25 DIAGNOSIS — Z853 Personal history of malignant neoplasm of breast: Secondary | ICD-10-CM | POA: Insufficient documentation

## 2022-05-25 DIAGNOSIS — Z86718 Personal history of other venous thrombosis and embolism: Secondary | ICD-10-CM | POA: Insufficient documentation

## 2022-05-25 DIAGNOSIS — Z85850 Personal history of malignant neoplasm of thyroid: Secondary | ICD-10-CM | POA: Diagnosis not present

## 2022-05-25 DIAGNOSIS — Z7902 Long term (current) use of antithrombotics/antiplatelets: Secondary | ICD-10-CM | POA: Diagnosis not present

## 2022-05-25 DIAGNOSIS — N183 Chronic kidney disease, stage 3 unspecified: Secondary | ICD-10-CM | POA: Insufficient documentation

## 2022-05-25 DIAGNOSIS — Z79899 Other long term (current) drug therapy: Secondary | ICD-10-CM | POA: Insufficient documentation

## 2022-05-25 DIAGNOSIS — Z7982 Long term (current) use of aspirin: Secondary | ICD-10-CM | POA: Diagnosis not present

## 2022-05-25 DIAGNOSIS — Z8571 Personal history of Hodgkin lymphoma: Secondary | ICD-10-CM | POA: Diagnosis present

## 2022-05-25 NOTE — Progress Notes (Signed)
[name], [name] [id number]   CLINIC:  Medical Oncology/Hematology  PCP:  Lemmie Evens, MD [place] / [place] [address] [phone]   REASON FOR VISIT:  Follow-up for triple negative breast cancer.  CURRENT THERAPY: surveillance  BRIEF ONCOLOGIC HISTORY:  Oncology History   No history exists.    CANCER STAGING:  Cancer Staging  No matching staging information was found for the patient.  INTERVAL HISTORY:  [name], a 88 y.o. female, returns for follow-up of her triple negative breast cancer.  She denies any new onset pains.  She had restaging mammogram and PET scan done.  Reports decrease in energy levels.  REVIEW OF SYSTEMS:  Review of Systems  Constitutional:  Negative for appetite change and fatigue.  Psychiatric/Behavioral:  Positive for sleep disturbance.   All other systems reviewed and are negative.   PAST MEDICAL/SURGICAL HISTORY:  Past Medical History:  Diagnosis Date   Asthma    Breast cancer, right breast ([facility])    CKD (chronic kidney disease), stage III ([place])    Coronary atherosclerosis    a. cardiac catheterization in 2004 with 60% mid LAD b. nonobstructive disease by cath in 07/2018 at time of NSTEMI   DVT (deep venous thrombosis) ([facility])    Essential hypertension    History of breast cancer 1987   Right-sided, mastectomy with axillary node dissection and Adriamycin    History of thyroid cancer    Hodgkin lymphoma ([facility]) 04/03/2017   Hodgkin's disease ([place])    Hypothyroidism    Nonischemic cardiomyopathy ([facility])    a. EF 35-40% by echo in 07/2018   NSTEMI (non-ST elevated myocardial infarction) ([facility]) 07/27/2018   Obesity    Osteoporosis    Peripheral arterial disease ([facility])    Stent to the left common iliac in 2006   Thyroid carcinoma ([facility]) 04/03/2017   Past Surgical History:  Procedure Laterality Date   ABDOMINAL HYSTERECTOMY     ANGIOPLASTY / STENTING FEMORAL Left 07/13/2005    APPENDECTOMY     BUNIONECTOMY Right    CATARACT EXTRACTION W/PHACO  05/28/2012   Procedure: CATARACT EXTRACTION PHACO AND INTRAOCULAR LENS PLACEMENT ([facility]);  Surgeon: Elta Guadeloupe T. Gershon Crane, MD;  Location: [facility];  Service: Ophthalmology;  Laterality: Right;  CDE 12.86   CATARACT EXTRACTION W/PHACO  06/04/2012   Procedure: CATARACT EXTRACTION PHACO AND INTRAOCULAR LENS PLACEMENT ([facility]);  Surgeon: Elta Guadeloupe T. Gershon Crane, MD;  Location: [facility];  Service: Ophthalmology;  Laterality: Left;  CDE:15.32   COLONOSCOPY     LEFT HEART CATH AND CORONARY ANGIOGRAPHY N/A 07/30/2018   Procedure: LEFT HEART CATH AND CORONARY ANGIOGRAPHY;  Surgeon: Belva Crome, MD;  Location: [facility];  Service: Cardiovascular;  Laterality: N/A;   MASTECTOMY PARTIAL / LUMPECTOMY W/ AXILLARY LYMPHADENECTOMY Right 1987   THYROIDECTOMY     TONSILLECTOMY     YAG LASER APPLICATION Right 29/52/8413   Procedure: YAG LASER APPLICATION;  Surgeon: Rutherford Guys, MD;  Location: [facility];  Service: Ophthalmology;  Laterality: Right;   YAG LASER APPLICATION Left 10/29/4008   Procedure: YAG LASER APPLICATION;  Surgeon: Rutherford Guys, MD;  Location: [facility];  Service: Ophthalmology;  Laterality: Left;    SOCIAL HISTORY:  Social History   Socioeconomic History   Marital status: Widowed    Spouse name: Not on file   Number of children: Not on file   Years of education: Not on file   Highest education level: Not on file  Occupational History   Not on file  Tobacco Use   Smoking status: Never   Smokeless tobacco: Never  Vaping Use   Vaping Use: Never used  Substance and Sexual Activity   Alcohol use: No   Drug use: No   Sexual activity: Not Currently    Birth control/protection: Surgical  Other Topics Concern   Not on file  Social History Narrative   Not on file   Social Determinants of Health   Financial Resource Strain: Low Risk  (11/11/2020)   Overall Financial Resource Strain (CARDIA)    Difficulty of Paying Living Expenses: Not  very hard  Food Insecurity: No Food Insecurity (11/11/2020)   Hunger Vital Sign    Worried About Running Out of Food in the Last Year: Never true    Ran Out of Food in the Last Year: Never true  Transportation Needs: No Transportation Needs (11/11/2020)   PRAPARE - Hydrologist (Medical): No    Lack of Transportation (Non-Medical): No  Physical Activity: Inactive (11/11/2020)   Exercise Vital Sign    Days of Exercise per Week: 0 days    Minutes of Exercise per Session: 0 min  Stress: No Stress Concern Present (11/11/2020)   [place]    Feeling of Stress : Not at all  Social Connections: Moderately Isolated (11/11/2020)   Social Connection and Isolation Panel [NHANES]    Frequency of Communication with Friends and Family: Three times a week    Frequency of Social Gatherings with Friends and Family: Twice a week    Attends Religious Services: 1 to 4 times per year    Active Member of [employer] or Organizations: No    Attends Archivist Meetings: Never    Marital Status: Widowed  Intimate Partner Violence: Not At Risk (11/11/2020)   Humiliation, Afraid, Rape, and Kick questionnaire    Fear of Current or Ex-Partner: No    Emotionally Abused: No    Physically Abused: No    Sexually Abused: No    FAMILY HISTORY:  Family History  Problem Relation Age of Onset   Lung cancer Mother    Heart attack Father     CURRENT MEDICATIONS:  Current Outpatient Medications  Medication Sig Dispense Refill   albuterol (VENTOLIN HFA) 108 (90 Base) MCG/ACT inhaler Inhale into the lungs.     amiodarone (PACERONE) 200 MG tablet Take 1/2 (one-half) tablet by mouth once daily 45 tablet 1   aspirin 81 MG tablet Take 81 mg by mouth daily.     atorvastatin (LIPITOR) 80 MG tablet Take 1 tablet (80 mg total) by mouth daily. 90 tablet 1   calcium-vitamin D (OSCAL WITH D) 500-200 MG-UNIT tablet Take 1 tablet by  mouth 3 (three) times daily. 90 tablet 1   carvedilol (COREG) 6.25 MG tablet Take 1 tablet (6.25 mg total) by mouth 2 (two) times daily with a meal. 180 tablet 3   clopidogrel (PLAVIX) 75 MG tablet Take 1 tablet by mouth once daily 90 tablet 2   FLOVENT HFA 44 MCG/ACT inhaler Inhale 2 puffs into the lungs 2 (two) times daily.  11   furosemide (LASIX) 20 MG tablet Take 1 tablet (20 mg total) by mouth every morning. 90 tablet 2   hydrALAZINE (APRESOLINE) 10 MG tablet Take 1 tablet (10 mg total) by mouth in the morning and at bedtime. 180 tablet 1   isosorbide mononitrate (IMDUR) 30 MG 24 hr  tablet Take 1/2 (one-half) tablet by mouth once daily 45 tablet 1   levothyroxine (SYNTHROID, LEVOTHROID) 100 MCG tablet Take 100 mcg by mouth daily.     Multiple Vitamin (MULTI-VITAMIN) tablet Take 1 tablet by mouth daily.     Multiple Vitamins-Calcium (ONE-A-DAY WOMENS PO) Take 1 tablet by mouth daily.     nitroGLYCERIN (NITROSTAT) 0.4 MG SL tablet Place 1 tablet (0.4 mg total) under the tongue every 5 (five) minutes x 3 doses as needed for chest pain. 25 tablet 1   SSD 1 % cream      temazepam (RESTORIL) 30 MG capsule Take 30 mg by mouth at bedtime.     vitamin C (ASCORBIC ACID) 500 MG tablet Take 500 mg by mouth daily.     zolpidem (AMBIEN) 5 MG tablet Take 2.5-5 mg by mouth at bedtime.     spironolactone (ALDACTONE) 25 MG tablet Take 0.5 tablets (12.5 mg total) by mouth daily. 45 tablet 3   No current facility-administered medications for this visit.    ALLERGIES:  No Known Allergies  PHYSICAL EXAM:  Performance status (ECOG): 1 - Symptomatic but completely ambulatory  Vitals:   05/25/22 1142  BP: 114/66  Pulse: 64  Resp: 18  Temp: 98.3 F (36.8 C)  SpO2: 97%   Wt Readings from Last 3 Encounters:  05/25/22 187 lb 13.3 oz (85.2 kg)  05/03/22 188 lb (85.3 kg)  01/30/22 186 lb 6.4 oz (84.6 kg)   Physical Exam Vitals reviewed.  Constitutional:      Appearance: Normal appearance.   Cardiovascular:     Rate and Rhythm: Normal rate and regular rhythm.     Pulses: Normal pulses.     Heart sounds: Normal heart sounds.  Pulmonary:     Effort: Pulmonary effort is normal.     Breath sounds: Normal breath sounds.  Neurological:     General: No focal deficit present.     Mental Status: She is alert and oriented to person, place, and time.  Psychiatric:        Mood and Affect: Mood normal.        Behavior: Behavior normal.      LABORATORY DATA:  I have reviewed the labs as listed.     Latest Ref Rng & Units 05/03/2022    2:31 PM 01/10/2022    8:34 AM 11/15/2021    8:47 AM  CBC  WBC 4.0 - 10.5 K/uL 5.7  5.0  5.8   Hemoglobin 12.0 - 15.0 g/dL 11.7  11.3  12.5   Hematocrit 36.0 - 46.0 % 36.9  35.6  38.9   Platelets 150 - 400 K/uL 198  174  201       Latest Ref Rng & Units 05/03/2022    2:32 PM 02/22/2022   12:41 PM 01/10/2022    8:34 AM  CMP  Glucose 70 - 99 mg/dL 98  100  99   BUN 8 - 23 mg/dL 44  42  51   Creatinine 0.44 - 1.00 mg/dL 2.31  2.41  2.31   Sodium 135 - 145 mmol/L 142  140  143   Potassium 3.5 - 5.1 mmol/L 4.0  4.8  4.8   Chloride 98 - 111 mmol/L 103  104  105   CO2 22 - 32 mmol/L '29  30  29   ' Calcium 8.9 - 10.3 mg/dL 8.2  6.6  7.3    7.3     DIAGNOSTIC IMAGING:  I have independently reviewed the  scans and discussed with the patient. 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 Patient has a remote history of treated right breast cancer. EXAM: DIGITAL DIAGNOSTIC UNILATERAL RIGHT MAMMOGRAM WITH TOMOSYNTHESIS; ULTRASOUND RIGHT BREAST LIMITED TECHNIQUE: Right digital diagnostic mammography and breast tomosynthesis was performed.; Targeted ultrasound examination of the right breast was performed COMPARISON:  Previous exam(s). ACR  Breast Density Category c: The breast tissue is heterogeneously dense, which may obscure small masses. FINDINGS: There is increased masslike density in the upper central right breast with continued progression of the pleomorphic calcifications involving the central medial right breast, with these calcifications spanning approximately 4.3 cm, previously 3.4 cm. Physical examination reveals a firm fixed mass involving the inner right breast. Targeted ultrasound of the right breast was performed. The irregular hypoechoic mass in the right breast at 3 o'clock 2 cm from nipple measures 4.2 x 1.7 x 3 cm (previously 3.6 by 1.9 x 2.9 cm). No additional mass is seen in the inner right breast sonographically. No abnormal lymph nodes definitively identified in the right axilla. IMPRESSION: Continued progression of known right breast malignancy with increased size of dominant mass and associated pleomorphic calcifications now measuring up to 4.2 cm. RECOMMENDATION: Treatment plan for known right breast malignancy. I have discussed the findings and recommendations with the patient. If applicable, a reminder letter will be sent to the patient regarding the next appointment. BI-RADS CATEGORY  6: Known biopsy-proven malignancy. Electronically Signed   By: Everlean Alstrom M.D.   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 Patient has a remote history of treated right breast cancer. EXAM: DIGITAL DIAGNOSTIC UNILATERAL RIGHT MAMMOGRAM WITH TOMOSYNTHESIS; ULTRASOUND RIGHT BREAST LIMITED TECHNIQUE: Right digital diagnostic mammography and breast tomosynthesis was performed.; Targeted ultrasound examination of the right breast was performed COMPARISON:  Previous exam(s). ACR Breast  Density Category c: The breast tissue is heterogeneously dense, which may obscure small masses. FINDINGS: There is increased masslike density in the upper central right breast with continued progression of the pleomorphic calcifications involving the central medial right breast, with these calcifications spanning approximately 4.3 cm, previously 3.4 cm. Physical examination reveals a firm fixed mass involving the inner right breast. Targeted ultrasound of the right breast was performed. The irregular hypoechoic mass in the right breast at 3 o'clock 2 cm from nipple measures 4.2 x 1.7 x 3 cm (previously 3.6 by 1.9 x 2.9 cm). No additional mass is seen in the inner right breast sonographically. No abnormal lymph nodes definitively identified in the right axilla. IMPRESSION: Continued progression of known right breast malignancy with increased size of dominant mass and associated pleomorphic calcifications now measuring up to 4.2 cm. RECOMMENDATION: Treatment plan for known right breast malignancy. I have discussed the findings and recommendations with the patient. If applicable, a reminder letter will be sent to the patient regarding the next appointment. BI-RADS CATEGORY  6: Known biopsy-proven malignancy. Electronically Signed   By: Everlean Alstrom M.D.   On: 05/23/2022 10:58  NM PET Image Restag (PS) Skull Base To Thigh  Result Date: 05/13/2022 CLINICAL DATA:  Subsequent  treatment strategy for breast cancer. EXAM: NUCLEAR MEDICINE PET SKULL BASE TO THIGH TECHNIQUE: 9.53 mCi F-18 FDG was injected intravenously. Full-ring PET imaging was performed from the skull base to thigh after the radiotracer. CT data was obtained and used for attenuation correction and anatomic localization. Fasting blood glucose: 104 mg/dl COMPARISON:  12/01/2021 FINDINGS: Mediastinal blood pool activity: SUV max 2.60 Liver activity: SUV max NA NECK: No hypermetabolic lymph nodes in the neck. Incidental CT findings: None. CHEST: The  medial right breast lesion is again noted. This measures 2.1 x 1.9 cm within SUV max 15.3. On the previous exam this measured 2.1 x 1.5 cm with SUV max of 9.95, image 113/3. Previous right FDG avid lymph node measures 1 cm with SUV max of 4.74, image 73/3. On the previous exam this measured 8 mm with SUV max of 4.16. Anterior left upper lobe lung nodule measures 1 cm with SUV max of 5.82. On the previous exam this measured 5 mm with SUV max 1.58. Part solid nodule within the posterior left upper is again noted. This measures approximately 1.3 cm with 5 mm solid component. The SUV max associated with this nodule is equal to 0.65. This is unchanged when compared with the previous exam. Obscured by respiratory motion artifact is a possible new lung nodule in the posterior right base which measures 1.4 cm and has an SUV max of 3.0, image 78/7. Incidental CT findings: Aortic atherosclerosis. Coronary artery calcifications. ABDOMEN/PELVIS: Within the posterolateral dome of the right lobe of liver there is a low-attenuation lesion measuring approximately 3 cm within SUV max of 8.76. This is new compared with the previous exam. No abnormal uptake within the pancreas or spleen. No FDG avid abdominopelvic lymph nodes identified. Incidental CT findings: Gallstones. Aortic atherosclerosis. 8 mm stone identified within the right renal pelvis without signs of hydronephrosis. Punctate stone within the interpolar collecting system of the left kidney is also noted without signs of hydronephrosis. SKELETON: No focal hypermetabolic activity to suggest skeletal metastasis. Incidental CT findings: None. IMPRESSION: 1. There is persistent FDG uptake associated with the right breast mass and previously noted FDG avid right axillary lymph node. 2. New FDG avid nodule is identified within the anterior left upper lobe concerning for metastatic disease. Additionally, obscured by respiratory motion artifact is a suspected nodule within the  right lung base, also FDG avid and suspicious for metastasis. 3. Within the posterior dome of right lobe of liver there is a new FDG avid lesion concerning for liver metastasis. 4. No change in part-solid nodule within the posterior left upper lobe. Attention to this nodule on future surveillance imaging is advised. 5. Aortic Atherosclerosis (ICD10-I70.0). Coronary artery calcifications. 6. Gallstones. 7. Bilateral renal calculi. Electronically Signed   By: Kerby Moors M.D.   On: 05/13/2022 16:26     ASSESSMENT:  1.  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   2.  Stage II right breast cancer: -Diagnosed in 1987, status post right lumpectomy and axillary lymph node dissection at [facility].  One lymph node was positive. -Adjuvant radiation therapy and tamoxifen.   3.  Thyroid cancer: -Status post near total thyroidectomy.  4.  Stage II right breast TNBC: - Mammogram on 11/09/2020 showed slight increase in calcifications in the right breast measuring 2.1 x 1.5 x 1.8 cm. - Biopsy on 11/18/2020 consistent with IDC in the lower inner quadrant of the right breast.  ER/PR was 0%.  Ki-67 30%.  HER2 is 2+ and equivocal.  HER2 by FISH was negative. - She was not felt to be a good surgical candidate given her cardiac comorbidities. - Right breast palliative radiation 3000 cGy in 10 fractions from 03/09/2021 through 03/22/2021.   PLAN:  1.  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   2.  Stage II triple negative right breast cancer: - Reviewed mammogram/ultrasound from 05/23/2022 which showed right breast 3:00 mass increased to 4.2 x 1.7 x 3 cm, previously 3.6 x 1.9 x 2.9 cm. - Reviewed PET scan from 05/11/2022 with persistent FDG uptake in the right breast and right axillary lymph node.  New FDG avid nodule identified in the anterior left upper lobe concerning for metastatic  disease.  Additionally there is a suspected nodule within the right lung base.  New liver lesion in the right lobe of the liver measures about 3 cm. - She is not a candidate for any chemotherapy. - I have talked to her about hospice evaluation.  She is agreeable. - I have also called her son Lanny Hurst in [place] and talk to him about scan findings and my recommendations. - We will make a referral to [facility].  RTC as needed.   3.  CKD: - Last creatinine is 2.31 and stable.   Orders placed this encounter:  No orders of the defined types were placed in this encounter.    Derek Jack, MD [place] [phone]

## 2022-05-25 NOTE — Patient Instructions (Signed)
[place] at [facility] Discharge Instructions   You were seen and examined today by Dr. Delton Coombes.  He discussed the results of the mammogram and PET scan you had. The tumor in your breast is getting larger. The PET scan shows there are new cancer spots on your liver and lung.   Dr. Raliegh Ip discussed with you and your son that the best plan moving forward is to forego any treatment (as chemotherapy is the only option at this point) and focus on quality of life and being comfortable.   We will make a referral to palliative care. They will come check on you periodically. When your condition worsens, you may transition to hospice.   Call the clinic if you should need anything. No in person follow-up is necessary.    Thank you for choosing [place] at [facility] to provide your oncology and hematology care.  To afford each patient quality time with our provider, please arrive at least 15 minutes before your scheduled appointment time.   If you have a lab appointment with the [facility] please come in thru the Main Entrance and check in at the main information desk.  You need to re-schedule your appointment should you arrive 10 or more minutes late.  We strive to give you quality time with our providers, and arriving late affects you and other patients whose appointments are after yours.  Also, if you no show three or more times for appointments you may be dismissed from the clinic at the providers discretion.     Again, thank you for choosing [facility].  Our hope is that these requests will decrease the amount of time that you wait before being seen by our physicians.       _____________________________________________________________  Should you have questions after your visit to [facility], please contact our office at [phone] and follow the prompts.  Our office hours are 8:00 a.m. and 4:30 p.m. Monday -  Friday.  Please note that voicemails left after 4:00 p.m. may not be returned until the following business day.  We are closed weekends and major holidays.  You do have access to a nurse 24-7, just call the main number to the clinic [phone] and do not press any options, hold on the line and a nurse will answer the phone.    For prescription refill requests, have your pharmacy contact our office and allow 72 hours.    Due to Covid, you will need to wear a mask upon entering the hospital. If you do not have a mask, a mask will be given to you at the Main Entrance upon arrival. For doctor visits, patients may have 1 support person age 67 or older with them. For treatment visits, patients can not have anyone with them due to social distancing guidelines and our immunocompromised population.

## 2022-06-01 ENCOUNTER — Other Ambulatory Visit: Payer: Self-pay | Admitting: Cardiology

## 2022-06-16 ENCOUNTER — Other Ambulatory Visit: Payer: Self-pay | Admitting: Cardiology

## 2022-07-11 ENCOUNTER — Other Ambulatory Visit (HOSPITAL_COMMUNITY): Payer: Self-pay | Admitting: Family Medicine

## 2022-07-11 DIAGNOSIS — R0602 Shortness of breath: Secondary | ICD-10-CM

## 2022-07-26 DEATH — deceased

## 2022-11-15 ENCOUNTER — Ambulatory Visit: Payer: Medicare HMO | Admitting: Cardiology
